# Patient Record
Sex: Male | Born: 1935 | Race: White | Hispanic: No | Marital: Married | Smoking: Former smoker
Health system: Southern US, Community
[De-identification: ages and names within clinical notes are randomized; demographics above are authoritative.]

## PROBLEM LIST (undated history)

## (undated) DIAGNOSIS — C61 Malignant neoplasm of prostate: Secondary | ICD-10-CM

## (undated) DIAGNOSIS — M1990 Unspecified osteoarthritis, unspecified site: Secondary | ICD-10-CM

## (undated) DIAGNOSIS — R0609 Other forms of dyspnea: Secondary | ICD-10-CM

## (undated) DIAGNOSIS — G250 Essential tremor: Secondary | ICD-10-CM

## (undated) DIAGNOSIS — I10 Essential (primary) hypertension: Secondary | ICD-10-CM

## (undated) DIAGNOSIS — I440 Atrioventricular block, first degree: Secondary | ICD-10-CM

## (undated) DIAGNOSIS — E785 Hyperlipidemia, unspecified: Secondary | ICD-10-CM

## (undated) DIAGNOSIS — Z950 Presence of cardiac pacemaker: Secondary | ICD-10-CM

## (undated) DIAGNOSIS — R0600 Dyspnea, unspecified: Secondary | ICD-10-CM

## (undated) DIAGNOSIS — Z720 Tobacco use: Secondary | ICD-10-CM

## (undated) DIAGNOSIS — I519 Heart disease, unspecified: Secondary | ICD-10-CM

## (undated) HISTORY — PX: ROBOT ASSISTED LAPAROSCOPIC RADICAL PROSTATECTOMY: SHX5141

## (undated) HISTORY — DX: Essential (primary) hypertension: I10

## (undated) HISTORY — DX: Dyspnea, unspecified: R06.00

## (undated) HISTORY — DX: Essential tremor: G25.0

## (undated) HISTORY — DX: Heart disease, unspecified: I51.9

## (undated) HISTORY — PX: HERNIA REPAIR: SHX51

## (undated) HISTORY — DX: Hyperlipidemia, unspecified: E78.5

## (undated) HISTORY — PX: KNEE SURGERY: SHX244

## (undated) HISTORY — DX: Unspecified osteoarthritis, unspecified site: M19.90

## (undated) HISTORY — DX: Other forms of dyspnea: R06.09

## (undated) HISTORY — PX: MENISCECTOMY: SHX123

## (undated) HISTORY — DX: Malignant neoplasm of prostate: C61

## (undated) HISTORY — DX: Atrioventricular block, first degree: I44.0

## (undated) HISTORY — PX: APPENDECTOMY: SHX54

## (undated) HISTORY — DX: Tobacco use: Z72.0

---

## 1999-09-29 ENCOUNTER — Ambulatory Visit (HOSPITAL_COMMUNITY): Admission: RE | Admit: 1999-09-29 | Discharge: 1999-09-29 | Payer: Self-pay | Admitting: Neurosurgery

## 1999-09-29 ENCOUNTER — Encounter: Payer: Self-pay | Admitting: Neurosurgery

## 2001-12-30 ENCOUNTER — Ambulatory Visit (HOSPITAL_COMMUNITY): Admission: RE | Admit: 2001-12-30 | Discharge: 2001-12-30 | Payer: Self-pay | Admitting: Pulmonary Disease

## 2002-01-13 ENCOUNTER — Ambulatory Visit (HOSPITAL_COMMUNITY): Admission: RE | Admit: 2002-01-13 | Discharge: 2002-01-13 | Payer: Self-pay | Admitting: Pulmonary Disease

## 2002-02-23 ENCOUNTER — Ambulatory Visit (HOSPITAL_COMMUNITY): Admission: RE | Admit: 2002-02-23 | Discharge: 2002-02-23 | Payer: Self-pay | Admitting: Internal Medicine

## 2002-03-03 ENCOUNTER — Encounter: Payer: Self-pay | Admitting: Unknown Physician Specialty

## 2002-03-03 ENCOUNTER — Encounter: Admission: RE | Admit: 2002-03-03 | Discharge: 2002-03-03 | Payer: Self-pay | Admitting: Unknown Physician Specialty

## 2005-06-07 ENCOUNTER — Encounter (HOSPITAL_COMMUNITY)
Admission: RE | Admit: 2005-06-07 | Discharge: 2005-07-07 | Payer: Self-pay | Admitting: Physical Medicine and Rehabilitation

## 2007-02-07 ENCOUNTER — Ambulatory Visit (HOSPITAL_COMMUNITY): Admission: RE | Admit: 2007-02-07 | Discharge: 2007-02-07 | Payer: Self-pay | Admitting: Pulmonary Disease

## 2009-04-04 ENCOUNTER — Encounter: Payer: Self-pay | Admitting: Internal Medicine

## 2009-04-18 ENCOUNTER — Ambulatory Visit: Payer: Self-pay | Admitting: Internal Medicine

## 2009-04-18 ENCOUNTER — Ambulatory Visit (HOSPITAL_COMMUNITY): Admission: RE | Admit: 2009-04-18 | Discharge: 2009-04-18 | Payer: Self-pay | Admitting: Internal Medicine

## 2009-08-20 DIAGNOSIS — I440 Atrioventricular block, first degree: Secondary | ICD-10-CM

## 2009-08-20 HISTORY — DX: Atrioventricular block, first degree: I44.0

## 2009-08-20 HISTORY — PX: COLONOSCOPY: SHX174

## 2009-12-10 ENCOUNTER — Emergency Department (HOSPITAL_COMMUNITY): Admission: EM | Admit: 2009-12-10 | Discharge: 2009-12-10 | Payer: Self-pay | Admitting: Emergency Medicine

## 2009-12-19 ENCOUNTER — Ambulatory Visit: Payer: Self-pay | Admitting: Orthopedic Surgery

## 2010-04-27 ENCOUNTER — Ambulatory Visit (HOSPITAL_COMMUNITY): Admission: RE | Admit: 2010-04-27 | Discharge: 2010-04-27 | Payer: Self-pay | Admitting: Pulmonary Disease

## 2010-05-03 ENCOUNTER — Ambulatory Visit (HOSPITAL_COMMUNITY): Admission: RE | Admit: 2010-05-03 | Discharge: 2010-05-03 | Payer: Self-pay | Admitting: Pulmonary Disease

## 2010-05-05 ENCOUNTER — Encounter (INDEPENDENT_AMBULATORY_CARE_PROVIDER_SITE_OTHER): Payer: Self-pay | Admitting: *Deleted

## 2010-05-05 ENCOUNTER — Ambulatory Visit: Payer: Self-pay | Admitting: Cardiology

## 2010-05-05 DIAGNOSIS — G250 Essential tremor: Secondary | ICD-10-CM

## 2010-05-05 DIAGNOSIS — E785 Hyperlipidemia, unspecified: Secondary | ICD-10-CM | POA: Insufficient documentation

## 2010-05-05 DIAGNOSIS — G252 Other specified forms of tremor: Secondary | ICD-10-CM

## 2010-05-05 DIAGNOSIS — F17200 Nicotine dependence, unspecified, uncomplicated: Secondary | ICD-10-CM

## 2010-05-05 DIAGNOSIS — I495 Sick sinus syndrome: Secondary | ICD-10-CM

## 2010-05-05 DIAGNOSIS — C61 Malignant neoplasm of prostate: Secondary | ICD-10-CM | POA: Insufficient documentation

## 2010-05-12 ENCOUNTER — Ambulatory Visit: Payer: Self-pay | Admitting: Cardiology

## 2010-05-17 ENCOUNTER — Encounter: Payer: Self-pay | Admitting: Cardiology

## 2010-05-25 ENCOUNTER — Encounter: Payer: Self-pay | Admitting: Cardiology

## 2010-05-25 ENCOUNTER — Ambulatory Visit (HOSPITAL_COMMUNITY): Admission: RE | Admit: 2010-05-25 | Discharge: 2010-05-25 | Payer: Self-pay | Admitting: Cardiology

## 2010-05-25 ENCOUNTER — Ambulatory Visit: Payer: Self-pay | Admitting: Cardiology

## 2010-05-30 ENCOUNTER — Ambulatory Visit: Payer: Self-pay | Admitting: Cardiology

## 2010-05-30 ENCOUNTER — Encounter (INDEPENDENT_AMBULATORY_CARE_PROVIDER_SITE_OTHER): Payer: Self-pay | Admitting: *Deleted

## 2010-06-08 ENCOUNTER — Telehealth (INDEPENDENT_AMBULATORY_CARE_PROVIDER_SITE_OTHER): Payer: Self-pay | Admitting: *Deleted

## 2010-07-04 ENCOUNTER — Ambulatory Visit (HOSPITAL_COMMUNITY): Admission: RE | Admit: 2010-07-04 | Discharge: 2010-07-04 | Payer: Self-pay | Admitting: Pulmonary Disease

## 2010-07-22 ENCOUNTER — Encounter: Payer: Self-pay | Admitting: Cardiology

## 2010-07-22 LAB — CONVERTED CEMR LAB
Calcium: 9.5 mg/dL (ref 8.4–10.5)
Cholesterol: 159 mg/dL (ref 0–200)
Glucose, Bld: 135 mg/dL — ABNORMAL HIGH (ref 70–99)
Potassium: 4.6 meq/L (ref 3.5–5.3)
Sodium: 139 meq/L (ref 135–145)
Total CHOL/HDL Ratio: 2.7
Triglycerides: 60 mg/dL (ref <150)

## 2010-09-19 NOTE — Assessment & Plan Note (Signed)
Summary: ***per Dr.Hawkins to eval for stress test/SOB/tg   Visit Type:  Initial Consult Referring Provider:  . Primary Provider:  Dr. Juanetta Gosling   History of Present Illness: Peter Lopez is seen in the office today at the kind request of Dr. Juanetta Gosling for the assessment of a progressive decline in exercise tolerance with exertional dyspnea.  This nice gentleman has enjoyed generally excellent health.  He has never previously been seen by a cardiologist nor does he have any known cardiac disorder.  He has undergone stress testing past, but not for many years.  He has noticed some decline in exercise capacity and dyspnea when walking 2 flights of stairs.  He attributes this to aging, but wonders whether he has any cardiopulmonary problems.  Recent PFTs were reportedly "good".  Current Medications (verified): 1)  Lisinopril 10 Mg Tabs (Lisinopril) .... Take 1 Tab Daily 2)  Simvastatin 40 Mg Tabs (Simvastatin) .... Take 1 Tab Daily 3)  Aspir-Low 81 Mg Tbec (Aspirin) .... Take 1 Tab Daily  Allergies (verified): No Known Drug Allergies  Past History:  Family History: Last updated: 2010-05-18 Father died at age 55 secondary to trauma Mother died at age 64 with Alzheimer's One brother died due to CHF  Social History: Last updated: 05/18/10 Married.  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  Employment-administrative work- Press photographer Tobacco-30 pack years; discontinued in 2000 Ethanol-modest 6 cups of coffee per day  Review of Systems       Requires corrective lenses for near vision; all other systems reviewed and are negative.  Vital Signs:  Patient profile:   75 year old male Weight:      173 pounds BMI:     25.64 Pulse rate:   72 / minute BP sitting:   161 / 78  (right arm)  Vitals Entered By: Dreama Saa, CNA (May 18, 2010 1:47 PM)   Impression & Recommendations:  Problem # 1:  CARDIAC CONDUCTION DISTURBANCE (ICD-426.9) Patient has profound first-degree AV block without symptoms.  I am concerned about continuing a beta blocker in the setting, but patient is concerned about exacerbation of his essential tremor.  I explained that treatment with a beta blocker will be feasible, but that a pacemaker may be necessary should symptoms of bradycardia develop.  For now, that medication will be discontinued and conduction reassessed in 1-2 weeks.  While he is off beta blocker, a stress echocardiogram will be performed to evaluate his complaints of dyspnea.  Problem # 2:  HYPERLIPIDEMIA (ICD-272.4) Recent lipid profile on therapy is being sought.  Problem # 3:  HYPERTENSION (ICD-401.1) Blood pressure control is suboptimal; however, patient avows that systolics are never this high.  I strongly suspect that he will require another blood pressure lowering agent, particularly with discontinuation of propranolol, but will allow him to get used to that fact.  He will monitor blood pressures at local pharmacies and return in one week for a blood pressure check with the  cardiology  nurses.  Problem # 4:  TOBACCO ABUSE (ICD-305.1) He claims only an occasional cigarette now and again over the past few years.  Problem # 5:  TREMOR, ESSENTIAL (ICD-333.1) His tremor is fairly pronounced and worsens  with anxiety, excitement or anger.  Beta blocker does appear to be the appropriate treatment for him.  Consulatation with a neurologist will be helpful in determining whether alternative treatments would be helpful or if even higher doses of beta blocker would result in even better symptom control.  This will be determined at a return visit after additional testing has been completed.  Other Orders: Stress Echo (Stress Echo)  Patient Instructions: 1)  Your physician recommends that you schedule a follow-up appointment in: after stress echo 2)  Your physician has recommended you make the following change in your medication:  stop propranolol 3)  Your physician has requested that you have a stress echocardiogram. For further information please visit [email or web address]  Please follow instruction sheet as given. 4)  Your physician has requested that you regularly monitor and record your blood pressure readings at home.  Please use the same machine at the same time of day to check your readings and record them to bring to your follow-up visit. 5)  You have been referred to  nurse visit in 1 week, chec bp's in drug store and record

## 2010-09-19 NOTE — Assessment & Plan Note (Signed)
Summary: 1 WK NURSE VISIT PER CHECKOUT ON 05/05/10/TG  Nurse Visit   Vital Signs:  Patient profile:   75 year old male Height:      69 inches Weight:      175 pounds BMI:     25.94 O2 Sat:      96 % on Room air Temp:     98.2 degrees F oral Pulse rate:   81 / minute BP sitting:   167 / 83  (left arm)  Vitals Entered By: Teressa Lower RN (May 12, 2010 3:49 PM)  Nutrition Counseling: Patient's BMI is greater than 25 and therefore counseled on weight management options.  O2 Flow:  Room air  Hypertension Assessment/Plan:      The patient's hypertensive risk group is category B: At least one risk factor (excluding diabetes) with no target organ damage.  Today's blood pressure is 167/83.     Visit Type:  1 week nurse visit Referring Provider:  Dr. Wende Crease Primary Provider:  Dr. Juanetta Gosling   History of Present Illness: S: ov on 05/05/10 B: stopped propranolol d/t 1st degree av block A: no cp, palp, pt has sob with exertion only, severe hand tremors;  pt takes lisinopril for Htn.  Tremors have increased, and he is unable to sign a check, eat properly, dial a telephone, drink liquids, etc.  BP dairy returned with 4 blood pressures as follows: 05/07/10 140/72 05/09/10 15078 05/10/10 136/72 05/11/10 140/72 R: I spoke with Dr. Dietrich Pates by telephone, orders given for neurology consult and pt may restart. propranolol, gave precautions for dizziness and loss of consciousness.  05/14/2010  Agree with the above.  Spiro Bing, M.D.    Hypertension History:      Positive major cardiovascular risk factors include male age 73 years old or older and family history for ischemic heart disease (males less than 10 years old).     Serial Vital Signs/Assessments:  Time      Position  BP       Pulse  Resp  Temp     By 3:49 PM             151/76   81                    Tammy Sanders RN  Comments: 3:49 PM right arm By: Teressa Lower RN    Current Medications (verified): 1)   Propranolol Hcl 40 Mg Tabs (Propranolol Hcl) 2)  Lisinopril 20 Mg Tabs (Lisinopril)  Allergies (verified): No Known Drug Allergies  Orders Added: 1)  Misc. Referral [Misc. Ref]

## 2010-09-19 NOTE — Letter (Signed)
Summary: [facility], [facility] at [employer]  [address], [place] [address]   Phone: [phone]  Fax: [phone]     May 30, 2010 MRN: [id number]   Peter Lopez [address] [place], [place]  [address]      YOUR LAB WORK IS DUE   July 31, 2010  Please go to [facility], located across the street from [facility] on the second floor.  Hours are Monday - Friday 7am until 7:30pm         Saturday 8am until 12noon    _X_  DO NOT EAT OR DRINK AFTER MIDNIGHT EVENING PRIOR TO LABWORK

## 2010-09-19 NOTE — Assessment & Plan Note (Signed)
Summary: F/U STRESS ECHO TO BE DONE 10/6/TG   Visit Type:  Follow-up Referring Provider:  Dr. Wende Crease Primary Provider:  Dr. Juanetta Gosling   History of Present Illness: [name] returns to the office for continued assessment and treatment of exertional dyspnea.  Since his last visit, he has been generally well, remaining active without any significant symptoms from day to day.  He has experienced dyspnea only with unaccustomed exercise such as running up a flight of stairs.  His stress test did show some impairment in exercise capacity.  He attributes that to an adverse reaction to doxycycline with diffuse myalgias.  Otherwise, there is no evidence for cardiac disease on his stress echocardiogram.  He remains asymptomatic with respect to conduction system disease.  He stopped propranolol for approximately one week, but developed an unacceptable increase in the magnitude of his benign essential tremor prompting him to resume that medication.  There was a modest improvement in PR interval with discontinuation of his beta blocker.  Current Medications (verified): 1)  Lisinopril-Hydrochlorothiazide 20-12.5 Mg Tabs (Lisinopril-Hydrochlorothiazide) .... Take 1 Tablet By Mouth Once A Day 2)  Simvastatin 40 Mg Tabs (Simvastatin) .... Take 1 Tab Daily 3)  Aspir-Low 81 Mg Tbec (Aspirin) .... Take 1 Tab Daily 4)  Propranolol Hcl 40 Mg Tabs (Propranolol Hcl) .... Take 1 Tab Daily  Allergies (verified): No Known Drug Allergies  Comments:  Nurse/Medical Assistant: patient stated some one told him to stop his simvastatin but don't remember who it was also started back on propranalol 40 mg daily reviewed oprevious ov and med list stated all meds were correct except not on simvastatin and started back on propranolol  Past History:  PMH, FH, and Social History reviewed and updated.  Past Surgical History: Right knee open meniscectomy Laparoscopic left knee surgery Radical  prostatectomy-robotic-assisted Appendectomy Herniorrhaphy Colonoscopy-2011  Review of Systems  The patient denies chest pain, syncope, dyspnea on exertion, peripheral edema, headaches, and abdominal pain.    Vital Signs:  Patient profile:   75 year old male Weight:      169 pounds BMI:     25.05 Pulse rate:   71 / minute BP sitting:   152 / 72  (right arm)  Vitals Entered By: Dreama Saa, CNA (May 30, 2010 11:32 AM)  Physical Exam  General:  Proportionate height and weight; well developed; no acute distress:   Neck-No JVD; no carotid bruits: Lungs-No tachypnea, no rales; no rhonchi; no wheezes: Cardiovascular-normal PMI; normal S1 and S2: Abdomen-BS normal; soft and non-tender without masses or organomegaly:  Skin-Warm, no significant lesions: Extremities-Nl distal pulses; no edema:     Impression & Recommendations:  Problem # 1:  CARDIAC CONDUCTION DISTURBANCE (ICD-426.9) The future course of his conduction system disturbance cannot be predicted.  It would be desirable to find an alternative drug for benign essential tremor.  There are a number of alternatives, but this problem would best be managed by a specialist.  I have taken the liberty of referring Peter Lopez to Dr. Sandria Manly for neurologic evaluation and treatment.  For the time being, he will continue propranolol 40 mg q.d.  Problem # 2:  HYPERTENSION (ICD-401.1) Blood pressure control has been slightly suboptimal as measured in the office as well as by the patient at local pharmacies.  Will change lisinopril to lisinopril HCT, which should provide the additional 5-10 mm decrease that he requires.  Future Orders: T-Basic Metabolic Panel [id number] [id number]) ... 07/31/2010  Other Orders: Future Orders: T-Lipid Profile ([id number]) ... 07/31/2010  Patient Instructions: 1)  Your physician recommends that you schedule a follow-up appointment in: 6 months 2)  Your physician has recommended you make the following change  in your medication: stop lisinopril and begin lisinopril/hctz 20/12.5 daily, resume simvastatin 40mg  daily, 3)  Your physician recommends that you return for lab work in:2 months Prescriptions: SIMVASTATIN 40 MG TABS (SIMVASTATIN) take 1 tab daily  #30 x 3   Entered by:   Teressa Lower RN   Authorized by:   Kathlen Brunswick, MD, [facility]   Signed by:   Teressa Lower RN on 05/30/2010   Method used:   Electronically to        [facility]* (retail)       [address]       [place], [place]  [address]       Ph: [phone]       Fax: [phone]   RxID:   [id number] LISINOPRIL-HYDROCHLOROTHIAZIDE 20-12.5 MG TABS (LISINOPRIL-HYDROCHLOROTHIAZIDE) Take 1 tablet by mouth once a day  #30 x 3   Entered by:   Teressa Lower RN   Authorized by:   Kathlen Brunswick, MD, [facility]   Signed by:   Teressa Lower RN on 05/30/2010   Method used:   Electronically to        [facility]* (retail)       [address]       [place], [place]  [address]       Ph: [phone]       Fax: [phone]   RxID:   [id number]

## 2010-09-19 NOTE — Assessment & Plan Note (Signed)
Summary: fx rt great toe bringing disc/humana/guarino/bsf   Vital Signs:  Patient profile:   75 year old male Height:      69 inches Weight:      172 pounds Pulse rate:   74 / minute Resp:     16 per minute  Vitals Entered By: Fuller Canada MD (Dec 19, 2009 9:51 AM)  Visit Type:  new patient Referring Provider:  Dr. Wende Crease Primary Provider:  Dr. Juanetta Gosling  CC:  right big toe fracture.Marland Kitchen  History of Present Illness: I saw Peter Lopez in the office today for an initial visit.  He is a 75 years old man with the complaint of:  right big toe fracture.  DOI 11/17/09 hit toe on wall after slipping.  Xrays Dr. Tenna Delaine 12/16/09 for review on disc.  Fracture shoe and buddy tape treatment from Dr. Wende Crease.  In regular shoes today.  Still soreness and swelling.     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 Cardiovascular:  Denies chest pain, palpitations, fainting, and murmurs. Respiratory:  Denies short of breath, wheezing, couch, tightness, pain on inspiration, and snoring . Gastrointestinal:  Denies heartburn, nausea, vomiting, diarrhea, constipation, and blood in your stools. Genitourinary:  Denies frequency, urgency, difficulty urinating, painful urination, flank pain, and bleeding in urine. Neurologic:  Denies numbness, tingling, unsteady gait, dizziness, tremors, and seizure. Musculoskeletal:  Denies joint pain, swelling, instability, stiffness, redness, heat, and muscle pain. Endocrine:  Denies excessive thirst, exessive urination, and heat or cold  intolerance. Psychiatric:  Denies nervousness, depression, anxiety, and hallucinations. Skin:  Denies changes in the skin, poor healing, rash, itching, and redness. HEENT:  Denies blurred or double vision, eye pain, redness, and watering. Immunology:  Denies seasonal allergies, sinus problems, and allergic to bee stings. Hemoatologic:  Denies easy bleeding and brusing.  Physical Exam  Additional Exam:  VS reviewed are stable  GENERAL: Appearance is normal   CDV: normal pulse and temperature   Skin: was normal   Neuro: sensation was normal   MSK The foot is tender and swollen but the alignment is normal.  He has normal range of motion in the metatarsal phalangeal joint.  There is no joint laxity.  Muscle tone in the foot is normal.     Impression & Recommendations:  Problem # 1:  FRACTURE, RIGHT GREAT TOE (ICD-826.0) Assessment New  x-rays show a transverse fracture which has not healed it was taken on April 26 this explains the patient's persistent pain  Orders: New Patient Level II ([address])  Patient Instructions: 1)  The fracture has not healed yet  2)  You should be ok in a few more weeks

## 2010-09-19 NOTE — Letter (Signed)
Summary: Historic Patient File  Historic Patient File   Imported By: Elvera Maria 12/30/2009 10:40:06  _____________________________________________________________________  External Attachment:    Type:   Image     Comment:   history form

## 2010-09-19 NOTE — Letter (Signed)
Summary: Stress Echocardiogram Information Sheet  [facility]  [address], [place] [address]   Phone: [phone]  Fax: [phone]      May 05, 2010 MRN: [id number] light prior to the test.   Chris Im  Doctor: Appointment Date: Appointment Time: Appointment Location: [facility]  Stress Echocardiogram Information Sheet    Instructions:   1. DO NOT  take your am medicinethe morning before the test.  2. Nothing to eat or drink before the test  3. Dress prepared to exercise.  4. DO NOT use ANY caffine or tobacco products 3 hours before appointment.  5. Report to the Short Stay Center on the1st floor.  6. Please bring all current prescription medications.  7. If you have any questions, please call [phone]

## 2010-09-19 NOTE — Progress Notes (Signed)
Summary: PT DOES NOT WANT TO SEE NEURO DOC  Phone Note Call from Patient Call back at Home Phone [phone]   Caller: PT Reason for Call: Referral Summary of Call: PT LEFT A MESSAGE 06/08/10 THAT HE HAD SEEN DR HAWKINS AND CHANGED HIS MIND ABOUT GOING TO DR LOVE.   I CALLED DR LOVE AND CANCELED THE APPOINTMENT THAT WAS MADE FOR DECEMBER/TMJ Initial call taken by: Faythe Ghee,  June 08, 2010 10:41 AM  Follow-up for Phone Call        Noted. Follow-up by: Kathlen Brunswick, MD, [facility],  June 13, 2010 7:25 PM

## 2010-10-26 DIAGNOSIS — I10 Essential (primary) hypertension: Secondary | ICD-10-CM

## 2010-11-02 LAB — BLOOD GAS, ARTERIAL
Bicarbonate: 23.5 mEq/L (ref 20.0–24.0)
O2 Content: 21 L/min
TCO2: 20.2 mmol/L (ref 0–100)
pO2, Arterial: 83.7 mmHg (ref 80.0–100.0)

## 2010-11-22 ENCOUNTER — Ambulatory Visit: Payer: Self-pay | Admitting: Cardiology

## 2010-12-12 ENCOUNTER — Encounter: Payer: Self-pay | Admitting: Cardiology

## 2010-12-12 ENCOUNTER — Ambulatory Visit (INDEPENDENT_AMBULATORY_CARE_PROVIDER_SITE_OTHER): Payer: 59 | Admitting: Cardiology

## 2010-12-12 DIAGNOSIS — I459 Conduction disorder, unspecified: Secondary | ICD-10-CM

## 2010-12-12 DIAGNOSIS — E785 Hyperlipidemia, unspecified: Secondary | ICD-10-CM

## 2010-12-12 DIAGNOSIS — I10 Essential (primary) hypertension: Secondary | ICD-10-CM

## 2010-12-12 NOTE — Progress Notes (Signed)
HPI : Mr. Vanbenschoten has done well since his last visit with good exercise capacity, no chest discomfort and no dyspnea.  He monitors blood pressure at home, measuring values of 100-140 systolic and diastolics less than 80.  He has experienced no lightheadedness and no syncope.  He was treated with a course of steroids for PMR with excellent results, but has subsequently been treated with methotrexate with some recurrence of symptoms.  Current Outpatient Prescriptions on File Prior to Visit  Medication Sig Dispense Refill  . aspirin 81 MG tablet Take 81 mg by mouth daily.        Marland Kitchen lisinopril-hydrochlorothiazide (PRINZIDE,ZESTORETIC) 20-12.5 MG per tablet Take 1 tablet by mouth daily.        . propranolol (INDERAL) 40 MG tablet Take 40 mg by mouth daily.        . simvastatin (ZOCOR) 40 MG tablet Take 40 mg by mouth daily.           No Known Allergies    Past medical history, social history, and family history reviewed and updated.  ROS:  See history of present illness.  PHYSICAL EXAM: BP 146/71  Pulse 69  Ht 5\' 9"  (1.753 m)  Wt 173 lb (78.472 kg)  BMI 25.55 kg/m2  SpO2 95%  General-Well developed; no acute distress; ruddy complexion Body habitus-proportionate weight and height Neck-No JVD; no carotid bruits Lungs-clear lung fields; resonant to percussion Cardiovascular-normal PMI; normal S1 and S2 Abdomen-normal bowel sounds; soft and non-tender without masses or organomegaly Musculoskeletal-No deformities, no cyanosis or clubbing Neurologic-Normal cranial nerves; symmetric strength and tone Skin-Warm, no significant lesions Extremities-distal pulses intact; no edema  Rhythm Strip-Normal sinus rhythm with profound first degree AV block and second degree AV block type I.  ASSESSMENT AND PLAN:

## 2010-12-12 NOTE — Patient Instructions (Signed)
Your physician recommends that you schedule a follow-up appointment in:1 YEAR Your physician has requested that you regularly monitor and record your blood pressure readings at home. Please use the same machine at the same time of day to check your readings and record them to bring to your follow-up visit.  HOME BP CUFF THAT MEASURES HEART RATE  CALL FOR LIGHT HEADED, SYNCOPE, HR <40

## 2010-12-13 ENCOUNTER — Encounter: Payer: Self-pay | Admitting: Cardiology

## 2010-12-13 NOTE — Assessment & Plan Note (Signed)
Patient remains asymptomatic with respect to his conduction system disease.  It appears likely that symptomatic bradycardia will occur at some point at which time pacemaker insertion will be necessary.

## 2010-12-13 NOTE — Assessment & Plan Note (Signed)
Blood pressure control is good; current medications are well tolerated and will be continued.

## 2010-12-13 NOTE — Assessment & Plan Note (Signed)
Recent lipid profile is good; current therapy will be continued.

## 2011-01-02 NOTE — Op Note (Signed)
NAME:  Peter Lopez, Peter Lopez                ACCOUNT NO.:  [id number]   MEDICAL RECORD NO.:  [id number]          PATIENT TYPE:  AMB   LOCATION:  DAY                           FACILITY:  [facility]   PHYSICIAN:  R. Roetta Sessions, M.D. DATE OF BIRTH:  Apr 21, 1936   DATE OF PROCEDURE:  04/18/2009  DATE OF DISCHARGE:                                PROCEDURE NOTE   COLONOSCOPY   INDICATIONS FOR PROCEDURE:  This 75 year old gentleman underwent  colonoscopy back in 2003 by Dr. Karilyn Cota.  He was found have polyps.  They  were adenomas.  He returns for surveillance at this time.  He is not  having any lower GI tract symptoms.  Interim history is significant in  that he underwent a TURP for prostate cancer 2 years ago in [facility]  and is doing very well.  He did not get any radiation therapy to the  prostate.  Colonoscopy is now being done as a surveillance maneuver.  Risks, benefits, alternatives and limitations have discussed, questions  answered.  Please see the documentation in the medical record.   PROCEDURE NOTE:  O2 saturation, blood pressure, pulse and respirations  monitored throughout the entire procedure.   CONSCIOUS SEDATION:  Versed 3 mg IV, Demerol 75 mg IV in divided doses.   INSTRUMENT:  Pentax video chip system.   FINDINGS:  Digital rectal exam revealed no abnormalities.   ENDOSCOPIC FINDINGS:  Prep was adequate.   Colon:  Colonic mucosa was surveyed from the rectosigmoid junction  through the left, transverse, right colon to the appendiceal orifice,  ileocecal valve and cecum.  These structures were well seen and  photographed for the record.  Terminal ileum was intubated to 10 cm.  From this level, the scope was slowly and cautiously withdrawn.  All  previously mentioned mucosal surfaces were again seen.  The patient had  left-sided diverticula.  However, the remainder of the colonic mucosa  otherwise appeared normal as did terminal ileum mucosa.  Scope was  pulled down to the rectum  where thorough examination of the rectal  mucosa including retroflexed view of the anal verge demonstrated no  abnormalities.  The patient tolerated the procedure well and was  reactive in endoscopy.   IMPRESSION:  1. Normal rectum.  2. Left-sided diverticula.  3. Remainder of the colonic mucosa appeared, normal terminal ileum.   RECOMMENDATIONS:  1. Diverticulosis literature provided to Mr. Beyl.  2. Recommend repeat colonoscopy in 5 years.      Peter Lopez, M.D.  Electronically Signed     RMR/MEDQ  D:  04/18/2009  T:  04/18/2009  Job:  [id number]   cc:   Ramon Dredge L. Juanetta Gosling, M.D.  Fax: [phone]

## 2011-01-05 NOTE — Procedures (Signed)
[facility]  Patient:    Peter Lopez, Peter Lopez Visit Number: [id number] MRN: [id number]          Service Type: OUT Location: RAD Attending Physician:  Fredirick Maudlin Dictated by:   Kari Baars, M.D. Proc. Date: 01/13/02 Admit Date:  12/30/2001 Discharge Date: 12/30/2001                                Stress Test  PROCEDURE:  Graded exercise stress test.  PHYSICIAN:  Dr. Kari Baars.  INDICATIONS:  Mr. Cosby is undergoing a graded exercise testing because of hypertension and a positive family history of coronary artery disease.  There are no contraindications to the graded exercise testing.  DESCRIPTION OF PROCEDURE:  Mr. Strahan exercised for five minutes on the Bruce protocol, reaching and sustaining 7.0 METS.  He stopped exercising because of fatigue.  His maximum recorded heart rate was 138, which is 88% of his age-predicted maximal heart rate.  His blood pressure response to exercise was exaggerated.  He had no electrocardiographic changes suggestive of inducible ischemia.  Other than shortness of breath, no symptoms during exercise.  IMPRESSION 1. Fair exercise tolerance. 2. Hypertensive response to exercise. 3. No evidence of inducible ischemia. 4. Shortness of breath, but no other symptoms with exercise. Dictated by:   Kari Baars, M.D. Attending Physician:  Fredirick Maudlin DD:  01/13/02 TD:  01/13/02 Job: [id number]

## 2011-07-06 ENCOUNTER — Other Ambulatory Visit: Payer: Self-pay | Admitting: Cardiology

## 2011-12-07 ENCOUNTER — Ambulatory Visit (INDEPENDENT_AMBULATORY_CARE_PROVIDER_SITE_OTHER): Payer: 59 | Admitting: Cardiology

## 2011-12-07 ENCOUNTER — Encounter: Payer: Self-pay | Admitting: Cardiology

## 2011-12-07 VITALS — BP 150/78 | HR 77 | Ht 69.0 in | Wt 172.0 lb

## 2011-12-07 DIAGNOSIS — E785 Hyperlipidemia, unspecified: Secondary | ICD-10-CM

## 2011-12-07 DIAGNOSIS — C61 Malignant neoplasm of prostate: Secondary | ICD-10-CM

## 2011-12-07 DIAGNOSIS — E782 Mixed hyperlipidemia: Secondary | ICD-10-CM

## 2011-12-07 DIAGNOSIS — I10 Essential (primary) hypertension: Secondary | ICD-10-CM

## 2011-12-07 NOTE — Progress Notes (Signed)
Patient ID: Peter Lopez, male   DOB: [birth year], 76 y.o.   MRN: [id number]  HPI: Scheduled return visit for this very nice gentleman with cardiovascular risk factors and a history of disturbed cardiac conduction, but no known vascular disease.  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  He monitors blood pressure at home with generally excellent results.  A list of 19 blood pressures measured over the past month was provided by the patient.  Occasional systolics are in the 140-150 range.  All diastolics are less than 80.  Has not been hospitalized, required urgent medical care nor developed any new medical problems.  PMR, which initially was a major problem, has been well controlled over the past year.  He claims minimal tobacco use, consuming one pack of cigarettes per week.  Prior to Admission medications   Medication Sig Start Date End Date Taking? Authorizing Provider  aspirin 81 MG tablet Take 81 mg by mouth daily.     Yes Historical Provider, MD  folic acid (FOLVITE) 400 MCG tablet Take 400 mcg by mouth 2 (two) times daily.     Yes Historical Provider, MD  lisinopril-hydrochlorothiazide (PRINZIDE,ZESTORETIC) 20-12.5 MG per tablet TAKE 1 TABLET ONCE DAILY. 07/06/11  Yes Kathlen Brunswick, MD  methotrexate 2.5 MG tablet Take by mouth once a week. 5 tabs 1 time weekly    Yes Historical Provider, MD  propranolol (INDERAL) 40 MG tablet Take 40 mg by mouth daily.     Yes Historical Provider, MD  simvastatin (ZOCOR) 40 MG tablet Take 40 mg by mouth daily.     Yes Historical Provider, MD   No Known Allergies    Past medical history, social history, and family history reviewed and updated.  ROS: Denies orthopnea, PND, chest discomfort, exertional dyspnea, palpitations, lightheadedness or syncope.  All other systems reviewed and are negative.  PHYSICAL EXAM: BP 150/78  Pulse 77  Ht 5\' 9"  (1.753 m)  Wt 78.019 kg (172 lb)  BMI  25.40 kg/m2; repeat blood pressure determination was unchanged. General-Well developed; no acute distress Body habitus-proportionate weight and height Neck-No JVD; no carotid bruits Lungs-clear lung fields; resonant to percussion Cardiovascular-normal PMI; normal S1 and S2 Abdomen-normal bowel sounds; soft and non-tender without masses or organomegaly Musculoskeletal-No deformities, no cyanosis or clubbing Neurologic-Normal cranial nerves; symmetric strength and tone Skin-Warm, bluish discoloration over the nose Extremities-distal pulses intact; no edema  ASSESSMENT AND PLAN:  [place] Bing, MD 12/07/2011 2:50 PM

## 2011-12-07 NOTE — Patient Instructions (Addendum)
Your physician recommends that you return for lab work in: Lipids today  Your physician recommends that you schedule a follow-up appointment in: 1 year

## 2011-12-07 NOTE — Progress Notes (Deleted)
Name: Peter Lopez    DOB: [birth year]  Age: 76 y.o.  MR#: [id number]       PCP:  Fredirick Maudlin, MD, MD      Insurance: @PAYORNAME @   CC:    Chief Complaint  Patient presents with  . 1 year f/u    Pt. has no complaints. Med list+    VS BP 150/78  Pulse 77  Ht 5\' 9"  (1.753 m)  Wt 172 lb (78.019 kg)  BMI 25.40 kg/m2  Weights Current Weight  12/07/11 172 lb (78.019 kg)  12/12/10 173 lb (78.472 kg)  05/30/10 169 lb (76.658 kg)    Blood Pressure  BP Readings from Last 3 Encounters:  12/07/11 150/78  12/12/10 146/71  05/30/10 152/72     Admit date:  (Not on file) Last encounter with RMR:  07/06/2011   Allergy No Known Allergies  Current Outpatient Prescriptions  Medication Sig Dispense Refill  . aspirin 81 MG tablet Take 81 mg by mouth daily.        . folic acid (FOLVITE) 400 MCG tablet Take 400 mcg by mouth 2 (two) times daily.        Marland Kitchen lisinopril-hydrochlorothiazide (PRINZIDE,ZESTORETIC) 20-12.5 MG per tablet TAKE 1 TABLET ONCE DAILY.  30 tablet  6  . methotrexate 2.5 MG tablet Take by mouth once a week. 5 tabs 1 time weekly       . propranolol (INDERAL) 40 MG tablet Take 40 mg by mouth daily.        . simvastatin (ZOCOR) 40 MG tablet Take 40 mg by mouth daily.          Discontinued Meds:   There are no discontinued medications.  Patient Active Problem List  Diagnoses  . ADENOCARCINOMA, PROSTATE  . HYPERLIPIDEMIA  . TOBACCO ABUSE  . TREMOR, ESSENTIAL  . CARDIAC CONDUCTION DISTURBANCE  . Hypertension    LABS No visits with results within 3 Month(s) from this visit. Latest known visit with results is:  [facility] Conversion Encounter on 07/22/2010  Component Date Value  . Sodium 07/22/2010 139   . Potassium 07/22/2010 4.6   . Chloride 07/22/2010 101   . CO2 07/22/2010 26   . Glucose, Bld 07/22/2010 135*  . BUN 07/22/2010 22   . Creatinine, Ser 07/22/2010 0.81   . Calcium 07/22/2010 9.5   . Cholesterol 07/22/2010 159   . Triglycerides 07/22/2010 60   .  HDL 07/22/2010 58   . Total CHOL/HDL Ratio 07/22/2010 2.7 Ratio   . VLDL 07/22/2010 12   . LDL Cholesterol 07/22/2010 89      Results for this Opt Visit:     Results for orders placed in visit on 07/22/10  CONVERTED [facility] LAB      Component Value Range   Sodium 139  135-145 (meq/L)   Potassium 4.6  3.5-5.3 (meq/L)   Chloride 101  96-112 (meq/L)   CO2 26  19-32 (meq/L)   Glucose, Bld 135 (*) 70-99 (mg/dL)   BUN 22  4-09 (mg/dL)   Creatinine, Ser 8.11  0.40-1.50 (mg/dL)   Calcium 9.5  9.1-47.8 (mg/dL)   Cholesterol 295  6-213 (mg/dL)   Triglycerides 60  <086 (mg/dL)   HDL 58  >57 (mg/dL)   Total CHOL/HDL Ratio 2.7 Ratio     VLDL 12  0-40 (mg/dL)   LDL Cholesterol 89  0-99 (mg/dL)    EKG Orders placed in visit on 05/17/10  . CONVERTED [facility] EKG     Prior Assessment and Plan  Problem List as of 12/07/2011          Cardiology Problems   HYPERLIPIDEMIA   Last Assessment & Plan Note   12/12/2010 Office Visit Signed 12/13/2010  9:52 PM by Kathlen Brunswick, MD    Recent lipid profile is good; current therapy will be continued.    CARDIAC CONDUCTION DISTURBANCE   Last Assessment & Plan Note   12/12/2010 Office Visit Signed 12/13/2010  9:52 PM by Kathlen Brunswick, MD    Patient remains asymptomatic with respect to his conduction system disease.  It appears likely that symptomatic bradycardia will occur at some point at which time pacemaker insertion will be necessary.    Hypertension   Last Assessment & Plan Note   12/12/2010 Office Visit Signed 12/13/2010  9:54 PM by Kathlen Brunswick, MD    Blood pressure control is good; current medications are well tolerated and will be continued.      Other   ADENOCARCINOMA, PROSTATE   TOBACCO ABUSE   TREMOR, ESSENTIAL       Imaging: No results found.   FRS Calculation: Score not calculated. Missing: Total Cholesterol

## 2011-12-08 ENCOUNTER — Encounter: Payer: Self-pay | Admitting: Cardiology

## 2011-12-08 LAB — LIPID PANEL
Cholesterol: 163 mg/dL (ref 0–200)
LDL Cholesterol: 86 mg/dL (ref 0–99)
Total CHOL/HDL Ratio: 2.9 Ratio
Triglycerides: 100 mg/dL (ref <150)
VLDL: 20 mg/dL (ref 0–40)

## 2011-12-08 NOTE — Assessment & Plan Note (Addendum)
No evidence for recurrent carcinoma.

## 2011-12-08 NOTE — Assessment & Plan Note (Signed)
Recent lipid profile demonstrates adequate control of hyperlipidemia with current medication, which will be continued.

## 2011-12-09 ENCOUNTER — Encounter: Payer: Self-pay | Admitting: Cardiology

## 2011-12-09 NOTE — Assessment & Plan Note (Signed)
Although blood pressure is slightly suboptimal at this visit, values at home have been excellent.  Current therapy will be continued.

## 2011-12-10 ENCOUNTER — Encounter: Payer: Self-pay | Admitting: *Deleted

## 2012-01-21 ENCOUNTER — Telehealth: Payer: Self-pay | Admitting: Cardiology

## 2012-01-21 NOTE — Telephone Encounter (Signed)
PT BP LAST NIGHT WAS 103/48 P 41. HE IS CONCERNED THAT THIS IS TO LOW. THIS AM IT IS 145/74 P 55.

## 2012-01-21 NOTE — Telephone Encounter (Signed)
State that his BP and pulse are fine today, however has had a couple of incidences of lox BP and pulse.  States that he has only been keeping a log of pressure 3 times weekly.  Advised him to keep a daily record and let us know if he has any associated symptoms.  Verbalizes understanding.

## 2012-07-02 ENCOUNTER — Other Ambulatory Visit (HOSPITAL_COMMUNITY): Payer: Self-pay | Admitting: Pulmonary Disease

## 2012-07-02 ENCOUNTER — Ambulatory Visit (HOSPITAL_COMMUNITY)
Admission: RE | Admit: 2012-07-02 | Discharge: 2012-07-02 | Disposition: A | Discharge status: Home / Self Care | Payer: Medicare PPO | Source: Ambulatory Visit | Attending: Pulmonary Disease | Admitting: Pulmonary Disease

## 2012-07-02 DIAGNOSIS — R05 Cough: Secondary | ICD-10-CM

## 2012-07-02 DIAGNOSIS — R059 Cough, unspecified: Secondary | ICD-10-CM | POA: Insufficient documentation

## 2012-09-26 ENCOUNTER — Inpatient Hospital Stay (HOSPITAL_COMMUNITY): Payer: Medicare Other

## 2012-09-26 ENCOUNTER — Inpatient Hospital Stay (HOSPITAL_COMMUNITY)
Admission: AD | Admit: 2012-09-26 | Discharge: 2012-09-30 | DRG: 536 | Disposition: A | Discharge status: Home / Self Care | Payer: Medicare Other | Source: Ambulatory Visit | Attending: Pulmonary Disease | Admitting: Pulmonary Disease

## 2012-09-26 ENCOUNTER — Encounter (HOSPITAL_COMMUNITY): Payer: Self-pay | Admitting: *Deleted

## 2012-09-26 DIAGNOSIS — G252 Other specified forms of tremor: Secondary | ICD-10-CM

## 2012-09-26 DIAGNOSIS — I10 Essential (primary) hypertension: Secondary | ICD-10-CM | POA: Diagnosis present

## 2012-09-26 DIAGNOSIS — J449 Chronic obstructive pulmonary disease, unspecified: Secondary | ICD-10-CM | POA: Diagnosis present

## 2012-09-26 DIAGNOSIS — W19XXXA Unspecified fall, initial encounter: Secondary | ICD-10-CM | POA: Diagnosis present

## 2012-09-26 DIAGNOSIS — S0003XA Contusion of scalp, initial encounter: Secondary | ICD-10-CM | POA: Diagnosis present

## 2012-09-26 DIAGNOSIS — I482 Chronic atrial fibrillation, unspecified: Secondary | ICD-10-CM | POA: Diagnosis present

## 2012-09-26 DIAGNOSIS — Z9079 Acquired absence of other genital organ(s): Secondary | ICD-10-CM

## 2012-09-26 DIAGNOSIS — J4489 Other specified chronic obstructive pulmonary disease: Secondary | ICD-10-CM | POA: Diagnosis present

## 2012-09-26 DIAGNOSIS — R55 Syncope and collapse: Secondary | ICD-10-CM

## 2012-09-26 DIAGNOSIS — Z8546 Personal history of malignant neoplasm of prostate: Secondary | ICD-10-CM

## 2012-09-26 DIAGNOSIS — L4050 Arthropathic psoriasis, unspecified: Secondary | ICD-10-CM | POA: Diagnosis present

## 2012-09-26 DIAGNOSIS — I517 Cardiomegaly: Secondary | ICD-10-CM

## 2012-09-26 DIAGNOSIS — F1010 Alcohol abuse, uncomplicated: Secondary | ICD-10-CM | POA: Diagnosis present

## 2012-09-26 DIAGNOSIS — F17200 Nicotine dependence, unspecified, uncomplicated: Secondary | ICD-10-CM | POA: Diagnosis present

## 2012-09-26 DIAGNOSIS — E785 Hyperlipidemia, unspecified: Secondary | ICD-10-CM | POA: Diagnosis present

## 2012-09-26 DIAGNOSIS — Z79899 Other long term (current) drug therapy: Secondary | ICD-10-CM

## 2012-09-26 DIAGNOSIS — S1093XA Contusion of unspecified part of neck, initial encounter: Secondary | ICD-10-CM | POA: Diagnosis present

## 2012-09-26 DIAGNOSIS — M1610 Unilateral primary osteoarthritis, unspecified hip: Secondary | ICD-10-CM

## 2012-09-26 DIAGNOSIS — S329XXA Fracture of unspecified parts of lumbosacral spine and pelvis, initial encounter for closed fracture: Secondary | ICD-10-CM

## 2012-09-26 DIAGNOSIS — E7800 Pure hypercholesterolemia, unspecified: Secondary | ICD-10-CM | POA: Diagnosis present

## 2012-09-26 DIAGNOSIS — I495 Sick sinus syndrome: Secondary | ICD-10-CM | POA: Diagnosis present

## 2012-09-26 DIAGNOSIS — S32509A Unspecified fracture of unspecified pubis, initial encounter for closed fracture: Principal | ICD-10-CM | POA: Diagnosis present

## 2012-09-26 DIAGNOSIS — I4891 Unspecified atrial fibrillation: Secondary | ICD-10-CM

## 2012-09-26 LAB — COMPREHENSIVE METABOLIC PANEL
ALT: 28 U/L (ref 0–53)
Albumin: 4 g/dL (ref 3.5–5.2)
Calcium: 9.4 mg/dL (ref 8.4–10.5)
GFR calc Af Amer: >90 mL/min (ref >90)
Glucose, Bld: 185 mg/dL — ABNORMAL HIGH (ref 70–99)
Sodium: 135 mEq/L (ref 135–145)
Total Protein: 7.3 g/dL (ref 6.0–8.3)

## 2012-09-26 LAB — CBC WITH DIFFERENTIAL/PLATELET
HCT: 47.2 % (ref 39.0–52.0)
Hemoglobin: 16.1 g/dL (ref 13.0–17.0)
Lymphocytes Relative: 17 % (ref 12–46)
Monocytes Absolute: 1.1 10*3/uL — ABNORMAL HIGH (ref 0.1–1.0)
Monocytes Relative: 11 % (ref 3–12)
Neutro Abs: 7.3 10*3/uL (ref 1.7–7.7)
WBC: 10.3 10*3/uL (ref 4.0–10.5)

## 2012-09-26 MED ORDER — SODIUM CHLORIDE 0.9 % IV SOLN: 250.0000 mL | INTRAVENOUS | Status: DC | PRN

## 2012-09-26 MED ORDER — SIMVASTATIN 20 MG PO TABS
40.0000 mg | ORAL_TABLET | Freq: Every day | ORAL | Status: DC
  Administered 2012-09-26 – 2012-09-29 (×4): 40 mg via ORAL
  Filled 2012-09-26 (×4): qty 2

## 2012-09-26 MED ORDER — ONDANSETRON HCL 4 MG PO TABS: 4.0000 mg | ORAL_TABLET | Freq: Four times a day (QID) | ORAL | Status: DC | PRN

## 2012-09-26 MED ORDER — ALUM & MAG HYDROXIDE-SIMETH 200-200-20 MG/5ML PO SUSP: 30.0000 mL | Freq: Four times a day (QID) | ORAL | Status: DC | PRN

## 2012-09-26 MED ORDER — SODIUM CHLORIDE 0.9 % IJ SOLN
3.0000 mL | Freq: Two times a day (BID) | INTRAMUSCULAR | Status: DC
  Administered 2012-09-28: 3 mL via INTRAVENOUS

## 2012-09-26 MED ORDER — FOLIC ACID 400 MCG PO TABS: 400.0000 ug | ORAL_TABLET | Freq: Two times a day (BID) | ORAL | Status: DC

## 2012-09-26 MED ORDER — LISINOPRIL-HYDROCHLOROTHIAZIDE 20-12.5 MG PO TABS: 1.0000 | ORAL_TABLET | Freq: Every day | ORAL | Status: DC

## 2012-09-26 MED ORDER — MELOXICAM 7.5 MG PO TABS
15.0000 mg | ORAL_TABLET | Freq: Every day | ORAL | Status: DC
  Administered 2012-09-27 – 2012-09-30 (×3): 15 mg via ORAL
  Filled 2012-09-26: qty 1
  Filled 2012-09-26: qty 2
  Filled 2012-09-26: qty 1
  Filled 2012-09-26 (×2): qty 2
  Filled 2012-09-26: qty 1

## 2012-09-26 MED ORDER — SODIUM CHLORIDE 0.9 % IJ SOLN
3.0000 mL | Freq: Two times a day (BID) | INTRAMUSCULAR | Status: DC
  Administered 2012-09-26 – 2012-09-29 (×5): 3 mL via INTRAVENOUS

## 2012-09-26 MED ORDER — PROPRANOLOL HCL 20 MG PO TABS
40.0000 mg | ORAL_TABLET | Freq: Every day | ORAL | Status: DC
  Administered 2012-09-28: 40 mg via ORAL
  Filled 2012-09-26 (×3): qty 2

## 2012-09-26 MED ORDER — ONDANSETRON HCL 4 MG/2ML IJ SOLN: 4.0000 mg | Freq: Four times a day (QID) | INTRAMUSCULAR | Status: DC | PRN

## 2012-09-26 MED ORDER — ACETAMINOPHEN 325 MG PO TABS
650.0000 mg | ORAL_TABLET | Freq: Four times a day (QID) | ORAL | Status: DC | PRN
  Administered 2012-09-26 – 2012-09-27 (×3): 650 mg via ORAL
  Filled 2012-09-26 (×3): qty 2

## 2012-09-26 MED ORDER — FOLIC ACID 1 MG PO TABS
0.5000 mg | ORAL_TABLET | Freq: Every day | ORAL | Status: DC
  Administered 2012-09-27 – 2012-09-30 (×4): 0.5 mg via ORAL
  Filled 2012-09-26 (×4): qty 1

## 2012-09-26 MED ORDER — SODIUM CHLORIDE 0.9 % IJ SOLN: 3.0000 mL | INTRAMUSCULAR | Status: DC | PRN

## 2012-09-26 MED ORDER — LISINOPRIL 10 MG PO TABS
20.0000 mg | ORAL_TABLET | Freq: Every day | ORAL | Status: DC
  Administered 2012-09-28 – 2012-09-30 (×3): 20 mg via ORAL
  Filled 2012-09-26 (×4): qty 2

## 2012-09-26 MED ORDER — HYDROCHLOROTHIAZIDE 12.5 MG PO CAPS
12.5000 mg | ORAL_CAPSULE | Freq: Every day | ORAL | Status: DC
  Administered 2012-09-28 – 2012-09-30 (×3): 12.5 mg via ORAL
  Filled 2012-09-26: qty 1

## 2012-09-26 MED ORDER — ENOXAPARIN SODIUM 40 MG/0.4ML ~~LOC~~ SOLN
40.0000 mg | SUBCUTANEOUS | Status: DC
  Administered 2012-09-26 – 2012-09-29 (×4): 40 mg via SUBCUTANEOUS
  Filled 2012-09-26 (×5): qty 0.4

## 2012-09-26 MED ORDER — POTASSIUM CHLORIDE CRYS ER 20 MEQ PO TBCR
20.0000 meq | EXTENDED_RELEASE_TABLET | Freq: Two times a day (BID) | ORAL | Status: DC
  Administered 2012-09-26 – 2012-09-30 (×8): 20 meq via ORAL
  Filled 2012-09-26 (×9): qty 1

## 2012-09-26 MED ORDER — HYDROCODONE-ACETAMINOPHEN 5-325 MG PO TABS
1.0000 | ORAL_TABLET | ORAL | Status: DC | PRN
  Administered 2012-09-28: 2 via ORAL
  Filled 2012-09-26: qty 2

## 2012-09-26 MED ORDER — ACETAMINOPHEN 650 MG RE SUPP: 650.0000 mg | Freq: Four times a day (QID) | RECTAL | Status: DC | PRN

## 2012-09-26 NOTE — Consult Note (Signed)
[facility] A. Gerilyn Pilgrim, MD     [email or web address]          Peter Lopez is an 77 y.o. male.   ASSESSMENT/PLAN: I believe the patient most likely had a syncopal episode due to effects of unusual amount of alcohol intake for the patient and the effects of his antihypertensive medication. I am not convinced that the MRI finding indeed represent an acute infarct. The images bright on diffusion but not dark on the ADC scan. In any situation is quite tightly and unlikely to explain his symptoms. It is a possibility that the patient may have had a unwitnessed seizure, but I think this is less likely given the lack of large cortical infarct on MRI and no previous history of seizure.  However, an EEG will be obtained. Also consider cardiac events?? Apparrenlty new afib. Given hx of HTN, probably should anticoagulate.   Pelvic fracture on the right as the likely etiology of the patient's right lower extremity pain and weakness. I agree with pain control, DVT prophylaxis and physical therapy. Orthopedic consultation I believe has been obtained.  The patient is 77 year old white male who was visiting with friends and further when he was found unresponsive. The patient admits that he was out drinking more than he usually does. He probably started drinking alcoholic beverage about 1 PM and left his friend about 5 PM. He tells me that he had at least 4 drinks during that time period. He typically does not drink alcoholic beverage at least not as much. Additionally, the patient has been started on her recent antihypertensive medication Azor about 6 weeks ago. He did have issues with his medication drop in his blood pressure too low. However, this was leveled off to the normal range in the 120s/80s before he went to [place]. The patient was found unresponsive on the floor. The policy had head injuries and they had aches and body pain. He was taken to the emergency room for head CT scan showed  nothing acute. He was going to be transferred to a normal facility but wanted to return home. Inside his seek additional medical attention due to right leg weakness and pain and does not feeling well. The patient denies any oral trauma. He does report body aches involving multiple locations including the upper or lower extremities. Again, he reports severe pain involving the right lower extremity which is associated with significant right leg weakness. The other extremities are normal. The patient does not report shortness of breath. He does have some mild diffuse chest soreness in accordance to his global body soreness. The review of systems otherwise negative other than stated in the history of present illness.  GENERAL: This is a pleasant thin man who appears to be in significant discomfort and pain.  HEENT: Supple. There is significant bruising and ecchymosis involving the right facial region.  ABDOMEN: soft  EXTREMITIES: No edema. There is markedly reduced range of motion of the right hip due to severe pain.   BACK: Normal.  SKIN: Normal by inspection.    MENTAL STATUS: Alert and oriented. Speech, language and cognition are generally intact. Judgment and insight normal.   CRANIAL NERVES: Pupils are equal, round and reactive to light and accomodation; extra ocular movements are full, there is no significant nystagmus; visual fields are full; upper and lower facial muscles are normal in strength and symmetric, there is no flattening of the nasolabial folds; tongue is midline; uvula is midline; shoulder elevation is normal.  MOTOR:  The patient has significant weakness of the right lower extremity graded as 2/5 due to marked pain. The other extremities shows normal tone, bulk and strength.  COORDINATION: Left finger to nose is normal, right finger to nose is normal, No rest tremor; no intention tremor; no postural tremor; no bradykinesia.  REFLEXES: Deep tendon reflexes are symmetrical and  normal. Babinski reflexes are extensor bilaterally.   SENSATION: Normal to light touch and temperature.   The brain MRI scan is reviewed. It is a limited scan because of claustrophobia. However, there is a tiny left frontal hyperdensity seen on diffusion imaging without a corresponding dark area seen on the ADC scan. There is a mild to moderate global atrophy. There is only minimal white matter lesions seen.    Past Medical History  Diagnosis Date  . Exertional dyspnea   . 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 Hypertension   . Hyperlipidemia   . Benign essential tremor   . Tobacco abuse     30 pack years; discontinued in 2000; currently one pack per week  . Prostate cancer     S/P Prostatectomy    Past Surgical History  Procedure Date  . Meniscectomy     Right knee open meniscectomy  . Knee surgery     Left laparoscopic knee surgery  . Robot assisted laparoscopic radical prostatectomy   . Appendectomy   . Colonoscopy 2011  . Hernia repair     Family History  Problem Relation Age of Onset  . Alzheimer's disease Mother 49  . Early death Father 7    trauma  . Heart failure Brother     Social History:  reports that he has been smoking Cigarettes.  He has been smoking about 1 pack per day. He has never used smokeless tobacco. He reports that he drinks alcohol. He reports that he does not use illicit drugs.  Allergies: No Known Allergies  Medications:  Prior to Admission medications   Medication Sig Start Date End Date Taking? Authorizing Provider  amLODipine-olmesartan (AZOR) 5-40 MG per tablet Take 1 tablet by mouth daily.   Yes Historical Provider, MD  aspirin 81 MG tablet Take 81 mg by mouth daily.     Yes Historical Provider, MD  folic acid (FOLVITE) 400 MCG tablet Take 400 mcg by mouth 2 (two) times daily.     Yes Historical Provider, MD  methotrexate (RHEUMATREX) 2.5 MG tablet Take 1 mg by mouth once a week.   Yes Historical Provider, MD  propranolol  (INDERAL) 40 MG tablet Take 40 mg by mouth daily.     Yes Historical Provider, MD  simvastatin (ZOCOR) 40 MG tablet Take 40 mg by mouth daily.     Yes Historical Provider, MD    Scheduled Meds:   . enoxaparin (LOVENOX) injection  40 mg Subcutaneous Q24H  . folic acid  0.5 mg Oral Daily  . lisinopril  20 mg Oral Daily   And  . hydrochlorothiazide  12.5 mg Oral Daily  . potassium chloride  20 mEq Oral BID  . propranolol  40 mg Oral Daily  . simvastatin  40 mg Oral q1800  . sodium chloride  3 mL Intravenous Q12H  . sodium chloride  3 mL Intravenous Q12H   Continuous Infusions:  PRN Meds:.sodium chloride, acetaminophen, acetaminophen, alum & mag hydroxide-simeth, HYDROcodone-acetaminophen, ondansetron (ZOFRAN) IV, ondansetron, sodium chloride   Blood pressure 163/80, pulse 60, temperature 98.3 F (36.8 C), temperature source Oral, resp. rate 18, height  5\' 9"  (1.753 m), weight 75.5 kg (166 lb 7.2 oz), SpO2 96.00%.   Results for orders placed during the hospital encounter of 09/26/12 (from the past 48 hour(s))  COMPREHENSIVE METABOLIC PANEL     Status: Abnormal   Collection Time   09/26/12  1:44 PM      Component Value Range Comment   Sodium 135  135 - 145 mEq/L    Potassium 3.4 (*) 3.5 - 5.1 mEq/L    Chloride 97  96 - 112 mEq/L    CO2 24  19 - 32 mEq/L    Glucose, Bld 185 (*) 70 - 99 mg/dL    BUN 16  6 - 23 mg/dL    Creatinine, Ser 1.61  0.50 - 1.35 mg/dL    Calcium 9.4  8.4 - 09.6 mg/dL    Total Protein 7.3  6.0 - 8.3 g/dL    Albumin 4.0  3.5 - 5.2 g/dL    AST 30  0 - 37 U/L    ALT 28  0 - 53 U/L    Alkaline Phosphatase 174 (*) 39 - 117 U/L    Total Bilirubin 3.4 (*) 0.3 - 1.2 mg/dL    GFR calc non Af Amer >90  >90 mL/min    GFR calc Af Amer >90  >90 mL/min   CBC WITH DIFFERENTIAL     Status: Abnormal   Collection Time   09/26/12  1:44 PM      Component Value Range Comment   WBC 10.3  4.0 - 10.5 K/uL    RBC 5.09  4.22 - 5.81 MIL/uL    Hemoglobin 16.1  13.0 - 17.0 g/dL     HCT 04.5  40.9 - 81.1 %    MCV 92.7  78.0 - 100.0 fL    MCH 31.6  26.0 - 34.0 pg    MCHC 34.1  30.0 - 36.0 g/dL    RDW 91.4  78.2 - 95.6 %    Platelets 158  150 - 400 K/uL    Neutrophils Relative 70  43 - 77 %    Neutro Abs 7.3  1.7 - 7.7 K/uL    Lymphocytes Relative 17  12 - 46 %    Lymphs Abs 1.7  0.7 - 4.0 K/uL    Monocytes Relative 11  3 - 12 %    Monocytes Absolute 1.1 (*) 0.1 - 1.0 K/uL    Eosinophils Relative 2  0 - 5 %    Eosinophils Absolute 0.2  0.0 - 0.7 K/uL    Basophils Relative 0  0 - 1 %    Basophils Absolute 0.0  0.0 - 0.1 K/uL   PROTIME-INR     Status: Abnormal   Collection Time   09/26/12  1:44 PM      Component Value Range Comment   Prothrombin Time 16.9 (*) 11.6 - 15.2 seconds    INR 1.41  0.00 - 1.49     Dg Hip Complete Right  09/26/2012  *RADIOLOGY REPORT*  Clinical Data: 77 year old male with recent fall.  Right hip and lower extremity pain.  RIGHT HIP - COMPLETE 2+ VIEW  Comparison: None.  Findings: Femoral heads are normally located.  Joint spaces are preserved.  Advanced calcified atherosclerosis continuing into the lower extremities.  Proximal right femur intact.  However, there is a minimally-displaced fracture of the right superior pubic ramus. The pelvis elsewhere appears intact.  Lower lumbar disc and endplate degeneration.  IMPRESSION: 1.  Minimally-displaced fracture right superior  pubic ramus. 2.  No other acute fracture identified about the right hip or pelvis. 3.  Advanced calcified atherosclerosis.   Original Report Authenticated By: Erskine Speed, M.D.    US Carotid Duplex Bilateral  09/26/2012  *RADIOLOGY REPORT*  Clinical Data: Stroke  BILATERAL CAROTID DUPLEX ULTRASOUND  Technique: Wallace Cullens scale imaging, color Doppler and duplex ultrasound was performed of bilateral carotid and vertebral arteries in the neck.  Comparison:  None.  Criteria:  Quantification of carotid stenosis is based on velocity parameters that correlate the residual internal carotid  diameter with NASCET-based stenosis levels, using the diameter of the distal internal carotid lumen as the denominator for stenosis measurement.  The following velocity measurements were obtained:                   PEAK SYSTOLIC/END DIASTOLIC RIGHT ICA:                        94cm/sec CCA:                        89cm/sec SYSTOLIC ICA/CCA RATIO:     1.05 DIASTOLIC ICA/CCA RATIO: ECA:                        190cm/sec  LEFT ICA:                        70cm/sec CCA:                        100cm/sec SYSTOLIC ICA/CCA RATIO:     0.81 DIASTOLIC ICA/CCA RATIO: ECA:                        280cm/sec  Findings:  RIGHT CAROTID ARTERY: There is calcified scattered plaque in the common carotid there is also scattered plaque in the bulb.  Low resistance internal carotid Doppler pattern.  RIGHT VERTEBRAL ARTERY:  Antegrade.  LEFT CAROTID ARTERY: There is significant calcified plaque in the left bulb with scattered mild calcified plaque in the common carotid.  Low resistance internal carotid Doppler pattern.  LEFT VERTEBRAL ARTERY:  Antegrade.  IMPRESSION: Less than 50% stenosis in the right and left internal carotid arteries.  There is calcified plaque in the bilateral common and internal carotid arteries.  Significant narrowing at the origin of the external carotid arteries is suspected.   Original Report Authenticated By: Jolaine Click, M.D.    Mr Brain Ltd W/o Cm  09/26/2012  *RADIOLOGY REPORT*  Clinical Data: Syncope  MRI HEAD WITHOUT CONTRAST  Technique:  Multiplanar, multiecho pulse sequences of the brain and surrounding structures were obtained according to standard protocol without intravenous contrast.  Comparison: None.  Findings: Incomplete study.  The patient was not able to complete the study.  Sagittal T1 and axial diffusion and T2 imaging was performed.  Small hyperintensity left frontal cortex on diffusion weighted imaging is most likely acute infarct.  No other areas of acute infarct.  Age appropriate atrophy.  No  hydrocephalus.  Brainstem and cerebellum are normal.  IMPRESSION: Incomplete study.  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   Original Report Authenticated By: [name], M.D.         Maximillian Habibi A. Gerilyn Pilgrim, M.D.  Diplomate, Biomedical engineer of Psychiatry and Neurology ( Neurology). 09/26/2012, 7:01 PM

## 2012-09-26 NOTE — Progress Notes (Signed)
*  PRELIMINARY RESULTS* Echocardiogram 2D Echocardiogram has been performed.  Peter Lopez 09/26/2012, 4:56 PM

## 2012-09-26 NOTE — H&P (Signed)
NAMERAND, ETCHISON NO.:  [id number]  MEDICAL RECORD NO.:  [id number]  LOCATION:  A315                          FACILITY:  [facility]  PHYSICIAN:  Seraphim Affinito L. Juanetta Gosling, M.D.DATE OF BIRTH:  May 23, 1936  DATE OF ADMISSION:  09/26/2012 DATE OF DISCHARGE:  LH                             HISTORY & PHYSICAL   CHIEF COMPLAINT:  Syncope.  HISTORY:  Mr. Serio is a 77 year old who was in his usual state of fair health, and was visiting [place], [place].  He was there with friends and apparently he had gone outside and had an unobserved syncopal event.  He was taken to the emergency room with no memory of the event and was found to have facial contusion and had a CT that showed what appeared to be a stroke.  He was also in atrial fibrillation which is new.  He was brought here, came to my office this morning.  He was having significant difficulty ambulating, said that he could not lift his right leg and also had difficulty even standing with the use of a walker.  He was still in atrial fibrillation with a fairly slow ventricular response.  He is admitted because of what appears to be a acute/subacute stroke, inability to use his leg, incomplete evaluation, atrial fibrillation, and is not doing well after release from the emergency room in [place].  PAST MEDICAL HISTORY:  Positive for prostate cancer, hyperlipidemia, essential tremor, hypertension.  SOCIAL HISTORY:  He smokes about a packet of cigarettes daily and has for sometime.  He lives at home.  FAMILY HISTORY:  His family history is not known to be positive for stroke.  REVIEW OF SYSTEMS:  Except as mentioned is negative.  He complains of some pain in his right hip.  He has not had trouble swallowing.  PHYSICAL EXAMINATION:  GENERAL:  A well-developed, well-nourished male who appears to be in some acute distress.  He has contusions on the right side of his face. NECK:  Supple. CHEST:  Relatively clear. HEART:   His heart rate is relatively slow in the 40s, but he seems to be somewhat irregular. ABDOMEN:  Soft without masses. EXTREMITIES:  No edema.  He is unable to lift his right leg.  He has mildly diminished grip strength in his right hand.  He has a tremor which is a chronic finding.  His tongue protrudes in midline.  He is using a walker but still having significant difficulty standing and ambulating.  ASSESSMENT:  I think he has had a stroke.  He had a CT and it was somewhat equivocal, so I think he needs to have an MRI to further evaluate this.  He has atrial fibrillation which is a new problem.  He has probably some element of COPD.  He has hypertension.  PLAN:  My plan then is going to be to get the MRI, get echocardiogram, carotid study, Neurology consultation, and he probably needs to be anticoagulated but I want to be sure that he does not have a significant hemorrhage.  He has psoriasis and psoriatic arthritis.     Xyla Leisner L. Juanetta Gosling, M.D.     ELH/MEDQ  D:  09/26/2012  T:  09/26/2012  Job:  [id number]

## 2012-09-26 NOTE — Plan of Care (Signed)
Problem: Phase I Progression Outcomes Goal: Strict NPO til swallow screen done Outcome: Completed/Met Date Met:  09/26/12 Pt passed swallow Goal: Initial discharge plan identified Outcome: Progressing Plan to d/c home with family and possible home health if needed

## 2012-09-27 MED ORDER — VITAMIN B-1 100 MG PO TABS
100.0000 mg | ORAL_TABLET | Freq: Every day | ORAL | Status: DC
  Administered 2012-09-27 – 2012-09-30 (×4): 100 mg via ORAL
  Filled 2012-09-27 (×4): qty 1

## 2012-09-27 MED ORDER — LORAZEPAM 1 MG PO TABS
1.0000 mg | ORAL_TABLET | Freq: Four times a day (QID) | ORAL | Status: AC | PRN
  Administered 2012-09-27 – 2012-09-29 (×2): 1 mg via ORAL
  Filled 2012-09-27 (×2): qty 1

## 2012-09-27 MED ORDER — NICOTINE 21 MG/24HR TD PT24
21.0000 mg | MEDICATED_PATCH | Freq: Every day | TRANSDERMAL | Status: DC
  Filled 2012-09-27 (×2): qty 1

## 2012-09-27 MED ORDER — TRAMADOL HCL 50 MG PO TABS
50.0000 mg | ORAL_TABLET | Freq: Four times a day (QID) | ORAL | Status: DC | PRN
  Administered 2012-09-27 – 2012-09-30 (×3): 50 mg via ORAL
  Filled 2012-09-27 (×3): qty 1

## 2012-09-27 MED ORDER — ADULT MULTIVITAMIN W/MINERALS CH
1.0000 | ORAL_TABLET | Freq: Every day | ORAL | Status: DC
  Administered 2012-09-27 – 2012-09-30 (×4): 1 via ORAL
  Filled 2012-09-27 (×4): qty 1

## 2012-09-27 MED ORDER — THIAMINE HCL 100 MG/ML IJ SOLN: 100.0000 mg | Freq: Every day | INTRAMUSCULAR | Status: DC

## 2012-09-27 MED ORDER — LORAZEPAM 2 MG/ML IJ SOLN: 1.0000 mg | Freq: Four times a day (QID) | INTRAMUSCULAR | Status: AC | PRN

## 2012-09-27 NOTE — Progress Notes (Signed)
Subjective: He says he feels better but has continued pain in his hip. His son and son-in-law has given me more information. He apparently had been drinking heavily prior to his episode of falling. They think he had at least 8-10 alcoholic beverages in the period of about 6 hours. He has had previous episodes similar to this. It is not clear them that what he had was a stroke and his atrial fibrillation may have been related to acute alcohol intake. Dr. Gerilyn Pilgrim has seen him and told the family that he does not think Peter Lopez had a definite stroke but I do not have Dr. Ronal Fear note yet. He may need anticoagulations because of atrial fibrillation but I'm going to ask for cardiology input as far as that is concerned. His family does not feel like he is willing to make a lifestyle change at this point but I will talk to Peter Lopez at some point when he does not have multiple family members in the room  Objective: Vital signs in last 24 hours: Temp:  [97.6 F (36.4 C)-98.7 F (37.1 C)] 97.6 F (36.4 C) (02/08 0542) Pulse Rate:  [60-64] 64 (02/08 0542) Resp:  [18-24] 24 (02/08 0542) BP: (120-163)/(54-80) 132/64 mmHg (02/08 1032) SpO2:  [95 %-96 %] 96 % (02/08 0542) Weight:  [75.5 kg (166 lb 7.2 oz)] 75.5 kg (166 lb 7.2 oz) (02/07 1210) Weight change:  Last BM Date: 09/26/12  Intake/Output from previous day: 02/07 0701 - 02/08 0700 In: 240 [P.O.:240] Out: 1350 [Urine:1350]  PHYSICAL EXAM General appearance: alert, cooperative and mild distress Resp: rhonchi bilaterally Cardio: irregularly irregular rhythm GI: soft, non-tender; bowel sounds normal; no masses,  no organomegaly Extremities: Some bruising of the right hip  Lab Results:    Basic Metabolic Panel:  Recent Labs  40/98/11 1344  NA 135  K 3.4*  CL 97  CO2 24  GLUCOSE 185*  BUN 16  CREATININE 0.57  CALCIUM 9.4   Liver Function Tests:  Recent Labs  09/26/12 1344  AST 30  ALT 28  ALKPHOS 174*  BILITOT 3.4*   PROT 7.3  ALBUMIN 4.0   No results found for this basename: LIPASE, AMYLASE,  in the last 72 hours No results found for this basename: AMMONIA,  in the last 72 hours CBC:  Recent Labs  09/26/12 1344  WBC 10.3  NEUTROABS 7.3  HGB 16.1  HCT 47.2  MCV 92.7  PLT 158   Cardiac Enzymes: No results found for this basename: CKTOTAL, CKMB, CKMBINDEX, TROPONINI,  in the last 72 hours BNP: No results found for this basename: PROBNP,  in the last 72 hours D-Dimer: No results found for this basename: DDIMER,  in the last 72 hours CBG: No results found for this basename: GLUCAP,  in the last 72 hours Hemoglobin A1C: No results found for this basename: HGBA1C,  in the last 72 hours Fasting Lipid Panel: No results found for this basename: CHOL, HDL, LDLCALC, TRIG, CHOLHDL, LDLDIRECT,  in the last 72 hours Thyroid Function Tests: No results found for this basename: TSH, T4TOTAL, FREET4, T3FREE, THYROIDAB,  in the last 72 hours Anemia Panel: No results found for this basename: VITAMINB12, FOLATE, FERRITIN, TIBC, IRON, RETICCTPCT,  in the last 72 hours Coagulation:  Recent Labs  09/26/12 1344  LABPROT 16.9*  INR 1.41   Urine Drug Screen: Drugs of Abuse  No results found for this basename: labopia, cocainscrnur, labbenz, amphetmu, thcu, labbarb    Alcohol Level: No results found for  this basename: ETH,  in the last 72 hours Urinalysis: No results found for this basename: COLORURINE, APPERANCEUR, LABSPEC, PHURINE, GLUCOSEU, HGBUR, BILIRUBINUR, KETONESUR, PROTEINUR, UROBILINOGEN, NITRITE, LEUKOCYTESUR,  in the last 72 hours Misc. Labs:  ABGS No results found for this basename: PHART, PCO2, PO2ART, TCO2, HCO3,  in the last 72 hours CULTURES No results found for this or any previous visit (from the past 240 hour(s)). Studies/Results: Dg Hip Complete Right  09/26/2012  *RADIOLOGY REPORT*  Clinical Data: 77 year old male with recent fall.  Right hip and lower extremity pain.  RIGHT  HIP - COMPLETE 2+ VIEW  Comparison: None.  Findings: Femoral heads are normally located.  Joint spaces are preserved.  Advanced calcified atherosclerosis continuing into the lower extremities.  Proximal right femur intact.  However, there is a minimally-displaced fracture of the right superior pubic ramus. The pelvis elsewhere appears intact.  Lower lumbar disc and endplate degeneration.  IMPRESSION: 1.  Minimally-displaced fracture right superior pubic ramus. 2.  No other acute fracture identified about the right hip or pelvis. 3.  Advanced calcified atherosclerosis.   Original Report Authenticated By: Erskine Speed, M.D.    US Carotid Duplex Bilateral  09/26/2012  *RADIOLOGY REPORT*  Clinical Data: Stroke  BILATERAL CAROTID DUPLEX ULTRASOUND  Technique: Wallace Cullens scale imaging, color Doppler and duplex ultrasound was performed of bilateral carotid and vertebral arteries in the neck.  Comparison:  None.  Criteria:  Quantification of carotid stenosis is based on velocity parameters that correlate the residual internal carotid diameter with NASCET-based stenosis levels, using the diameter of the distal internal carotid lumen as the denominator for stenosis measurement.  The following velocity measurements were obtained:                   PEAK SYSTOLIC/END DIASTOLIC RIGHT ICA:                        94cm/sec CCA:                        89cm/sec SYSTOLIC ICA/CCA RATIO:     1.05 DIASTOLIC ICA/CCA RATIO: ECA:                        190cm/sec  LEFT ICA:                        70cm/sec CCA:                        100cm/sec SYSTOLIC ICA/CCA RATIO:     0.81 DIASTOLIC ICA/CCA RATIO: ECA:                        280cm/sec  Findings:  RIGHT CAROTID ARTERY: There is calcified scattered plaque in the common carotid there is also scattered plaque in the bulb.  Low resistance internal carotid Doppler pattern.  RIGHT VERTEBRAL ARTERY:  Antegrade.  LEFT CAROTID ARTERY: There is significant calcified plaque in the left bulb with scattered  mild calcified plaque in the common carotid.  Low resistance internal carotid Doppler pattern.  LEFT VERTEBRAL ARTERY:  Antegrade.  IMPRESSION: Less than 50% stenosis in the right and left internal carotid arteries.  There is calcified plaque in the bilateral common and internal carotid arteries.  Significant narrowing at the origin of the external carotid arteries is suspected.   Original Report Authenticated By:  Jolaine Click, M.D.    Mr Brain Ltd W/o Cm  09/26/2012  *RADIOLOGY REPORT*  Clinical Data: Syncope  MRI HEAD WITHOUT CONTRAST  Technique:  Multiplanar, multiecho pulse sequences of the brain and surrounding structures were obtained according to standard protocol without intravenous contrast.  Comparison: None.  Findings: Incomplete study.  The patient was not able to complete the study.  Sagittal T1 and axial diffusion and T2 imaging was performed.  Small hyperintensity left frontal cortex on diffusion weighted imaging is most likely acute infarct.  No other areas of acute infarct.  Age appropriate atrophy.  No hydrocephalus.  Brainstem and cerebellum are normal.  IMPRESSION: Incomplete study.  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   Original Report Authenticated By: [name], M.D.     Medications:  Prior to Admission:  Prescriptions prior to admission  Medication Sig Dispense Refill  . amLODipine-olmesartan (AZOR) 5-40 MG per tablet Take 1 tablet by mouth daily.      Marland Kitchen aspirin 81 MG tablet Take 81 mg by mouth daily.        . folic acid (FOLVITE) 400 MCG tablet Take 400 mcg by mouth 2 (two) times daily.        . methotrexate (RHEUMATREX) 2.5 MG tablet Take 1 mg by mouth once a week.      . propranolol (INDERAL) 40 MG tablet Take 40 mg by mouth daily.        . simvastatin (ZOCOR) 40 MG tablet Take 40 mg by mouth daily.         Scheduled: . enoxaparin (LOVENOX) injection  40 mg Subcutaneous Q24H  . folic acid  0.5 mg Oral Daily  . lisinopril  20 mg Oral Daily    And  . hydrochlorothiazide  12.5 mg Oral Daily  . meloxicam  15 mg Oral Daily  . multivitamin with minerals  1 tablet Oral Daily  . nicotine  21 mg Transdermal Daily  . potassium chloride  20 mEq Oral BID  . propranolol  40 mg Oral Daily  . simvastatin  40 mg Oral q1800  . sodium chloride  3 mL Intravenous Q12H  . sodium chloride  3 mL Intravenous Q12H  . thiamine  100 mg Oral Daily   Or  . thiamine  100 mg Intravenous Daily   Continuous:  OZH:YQMVHQ chloride, acetaminophen, acetaminophen, alum & mag hydroxide-simeth, HYDROcodone-acetaminophen, LORazepam, LORazepam, ondansetron (ZOFRAN) IV, ondansetron, sodium chloride, traMADol  Assesment: He has a pelvic fracture. He had a syncopal episode and there was concern that he had a stroke. With the further history we have available this may actually have been an alcoholic blackout and fall. He has hypertension which is fairly well controlled. He has atrial fibrillation which is a new finding. Active Problems:   * No active hospital problems. *    Plan: I am going to start him on alcohol withdrawal protocol with this new information and also going to have him use a nicotine patch. He is going to try to get up and go to the bathroom with assistance and using his walker. I will last for orthopedic consultation regarding his pelvic fracture but I don't think there's anything that can be done. I will asked for cardiology consultation regarding the atrial fibrillation and see if they feel that he is a candidate for anti-coagulation but that may be problematic because he has had several falls.    LOS: 1 day   Alcie Runions L 09/27/2012, 10:34 AM

## 2012-09-27 NOTE — Evaluation (Signed)
Physical Therapy Evaluation Patient Details Name: Peter Lopez MRN: [id number] DOB: [birth year] Today's Date: 09/27/2012 Time: 2130-8657 PT Time Calculation (min): 32 min  PT Assessment / Plan / Recommendation Clinical Impression  Pt is a 77 year old male admitted to [facility] secondary to a fall at home with possible stroke and findings of minimally displaced R pubic fracture.  At this time he is still awaiting consult from orthopedic surgeon.  He is referred to PT for general mobility.  At this time he requires overall min A-mod A for mobility and is limited mostly by pain to groin region and has decreased activitiy tolerance. Recommend OP PT and educated to continue with LE exercises in bed that he was doing at home before the fall to improve LE strength.     PT Assessment  Patient needs continued PT services    Follow Up Recommendations  Outpatient PT    Does the patient have the potential to tolerate intense rehabilitation      Barriers to Discharge        Equipment Recommendations  Rolling walker with 5" wheels    Recommendations for Other Services OT consult   Frequency 7X/week    Precautions / Restrictions     Pertinent Vitals/Pain 8/10 - made nursing aware.       Mobility  Bed Mobility Bed Mobility: Supine to Sit;Sitting - Scoot to Edge of Bed Supine to Sit: 3: Mod assist Sitting - Scoot to Edge of Bed: 5: Supervision Transfers Transfers: Sit to Stand;Stand to Sit Sit to Stand: 4: Min guard;With upper extremity assist;From bed Stand to Sit: 4: Min assist;With armrests;To chair/3-in-1 Details for Transfer Assistance: cueing for handplacement and direction to chair Ambulation/Gait Ambulation/Gait Assistance: 4: Min guard Ambulation Distance (Feet): 100 Feet Assistive device: Rollator Ambulation/Gait Assistance Details: cueing for posture.  Using a rollator given to him from a friend, it is too short.  Attempted to adjust, however it was at its max height Gait  Pattern: Decreased trunk rotation;Antalgic;Lateral hip instability;Trunk flexed General Gait Details: trunk flexed due to improper height of rollator.  Pt wanted to use rollator secondary to comofort level.  Stairs: No    Exercises General Exercises - Lower Extremity Long Arc Quad: AROM;Both;10 reps Toe Raises: AROM;Both;5 reps Heel Raises: AROM;Both;5 reps   PT Diagnosis: Difficulty walking;Generalized weakness;Acute pain  PT Problem List: Decreased activity tolerance;Decreased mobility;Decreased strength;Pain PT Treatment Interventions: DME instruction;Gait training;Therapeutic activities;Therapeutic exercise;Balance training   PT Goals Acute Rehab PT Goals PT Goal Formulation: With patient Time For Goal Achievement: 10/04/12 Pt will go Supine/Side to Sit: with min assist;with rail;with HOB not 0 degrees (comment degree) PT Goal: Supine/Side to Sit - Progress: Goal set today Pt will go Sit to Stand: with upper extremity assist;with modified independence PT Goal: Sit to Stand - Progress: Goal set today Pt will go Stand to Sit: with upper extremity assist;with modified independence PT Goal: Stand to Sit - Progress: Goal set today Pt will Ambulate: >150 feet;with modified independence;with least restrictive assistive device Pt will Go Up / Down Stairs: 1-2 stairs;with supervision;with rail(s) PT Goal: Up/Down Stairs - Progress: Goal set today Pt will Perform Home Exercise Program: Independently PT Goal: Perform Home Exercise Program - Progress: Goal set today  Visit Information  Last PT Received On: 09/27/12    Subjective Data  Subjective: Pt reports that his hip is hurting a lot when moving.  Has a hard time bending his R leg.  Patient Stated Goal: plans to go home.  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, nature) Communication Communication: No difficulties Dominant Hand: Right    Cognition  Cognition Overall Cognitive Status: Appears within functional limits for tasks assessed/performed Arousal/Alertness: Awake/alert Orientation Level: Appears intact for tasks assessed Behavior During Session: [facility] for tasks performed    Extremity/Trunk Assessment Right Lower Extremity Assessment RLE ROM/Strength/Tone: Deficits;Due to pain Left Lower Extremity Assessment LLE ROM/Strength/Tone: WFL for tasks assessed   Balance Balance Balance Assessed: Yes Static Sitting Balance Static Sitting - Balance Support: No upper extremity supported;Feet supported Static Sitting - Level of Assistance: 7: Independent  End of Session PT - End of Session Equipment Utilized During Treatment: Gait belt Patient left: in chair;with call bell/phone within reach;with family/visitor present Nurse Communication: Mobility status;Patient requests pain meds   Lashaye Fisk, PT 09/27/2012, 2:36 PM

## 2012-09-27 NOTE — Progress Notes (Signed)
Dr. Juanetta Gosling notified of patients HR trend for the day mid 40's to low 70's. MD acknowleged. No new orders at this time. Will continue to monitor pts HR, pain, and CIWA scale.

## 2012-09-28 NOTE — Progress Notes (Addendum)
Physical Therapy Treatment Patient Details Name: Peter Lopez MRN: [id number] DOB: 06/22/1936 Today's Date: 09/28/2012 Time: 0850-0910 PT Time Calculation (min): 20 min  PT Assessment / Plan / Recommendation Comments on Treatment Session  Pt appears to have improved since last tx. Pt able to ambulate increased distance with decreased pain. Reviewed bed exercises and pt is independent with them. RLE requires some manual assistance from patient. Pt is aware of the importance of exercise and is eager to participate in therapy.                       Plan  (Continue per PT POC.)    Precautions / Restrictions Restrictions Weight Bearing Restrictions: No    Mobility  Transfers Transfers: Sit to Stand;Stand to Sit Sit to Stand: 6: Modified independent (Device/Increase time);With upper extremity assist Stand to Sit: 6: Modified independent (Device/Increase time);With upper extremity assist Ambulation/Gait Ambulation/Gait Assistance: 5: Supervision Ambulation Distance (Feet): 300 Feet Assistive device: Rollator Ambulation/Gait Assistance Details: Pt is very aware that he needs tostand with correct posture. Gait Pattern: Decreased stride length    Exercises Ankle pumps , quad sets, heel slides and SLR were reviewed and pt is independent with these. He completes these exercises throughout the day on his own.   Visit Information  Last PT Received On: 09/28/12    Subjective Data  Subjective: Pt states that he is feeling much better today.         End of Session PT - End of Session Equipment Utilized During Treatment: Gait belt Activity Tolerance: Patient tolerated treatment well Patient left: in bed;with call bell/phone within reach  Seth Bake, PTA 09/28/2012, 9:20 AM

## 2012-09-28 NOTE — Progress Notes (Signed)
Subjective: He was admitted with syncope and has a pelvic fracture. He has new diagnosis of atrial fibrillation with well-controlled ventricular response. According to his family he has had several falls so I'm not sure about fully anticoagulating him. He apparently has a history of significant alcohol abuse but when I discussed that with him today he down played that.  Objective: Vital signs in last 24 hours: Temp:  [97.2 F (36.2 C)-97.8 F (36.6 C)] 97.2 F (36.2 C) (02/09 0520) Pulse Rate:  [44-58] 58 (02/09 0520) Resp:  [19-20] 20 (02/09 0520) BP: (95-140)/(55-65) 140/65 mmHg (02/09 0520) SpO2:  [93 %-97 %] 97 % (02/09 0520) Weight:  [75.5 kg (166 lb 7.2 oz)] 75.5 kg (166 lb 7.2 oz) (02/09 0520) Weight change: 0 kg (0 lb) Last BM Date: 09/26/12  Intake/Output from previous day: 02/08 0701 - 02/09 0700 In: 480 [P.O.:480] Out: -   PHYSICAL EXAM General appearance: alert, cooperative and no distress Resp: clear to auscultation bilaterally Cardio: irregularly irregular rhythm GI: soft, non-tender; bowel sounds normal; no masses,  no organomegaly Extremities: He has better range of motion of his right leg  Lab Results:    Basic Metabolic Panel:  Recent Labs  16/10/96 1344  NA 135  K 3.4*  CL 97  CO2 24  GLUCOSE 185*  BUN 16  CREATININE 0.57  CALCIUM 9.4   Liver Function Tests:  Recent Labs  09/26/12 1344  AST 30  ALT 28  ALKPHOS 174*  BILITOT 3.4*  PROT 7.3  ALBUMIN 4.0   No results found for this basename: LIPASE, AMYLASE,  in the last 72 hours No results found for this basename: AMMONIA,  in the last 72 hours CBC:  Recent Labs  09/26/12 1344  WBC 10.3  NEUTROABS 7.3  HGB 16.1  HCT 47.2  MCV 92.7  PLT 158   Cardiac Enzymes: No results found for this basename: CKTOTAL, CKMB, CKMBINDEX, TROPONINI,  in the last 72 hours BNP: No results found for this basename: PROBNP,  in the last 72 hours D-Dimer: No results found for this basename: DDIMER,   in the last 72 hours CBG: No results found for this basename: GLUCAP,  in the last 72 hours Hemoglobin A1C: No results found for this basename: HGBA1C,  in the last 72 hours Fasting Lipid Panel: No results found for this basename: CHOL, HDL, LDLCALC, TRIG, CHOLHDL, LDLDIRECT,  in the last 72 hours Thyroid Function Tests: No results found for this basename: TSH, T4TOTAL, FREET4, T3FREE, THYROIDAB,  in the last 72 hours Anemia Panel: No results found for this basename: VITAMINB12, FOLATE, FERRITIN, TIBC, IRON, RETICCTPCT,  in the last 72 hours Coagulation:  Recent Labs  09/26/12 1344  LABPROT 16.9*  INR 1.41   Urine Drug Screen: Drugs of Abuse  No results found for this basename: labopia, cocainscrnur, labbenz, amphetmu, thcu, labbarb    Alcohol Level: No results found for this basename: ETH,  in the last 72 hours Urinalysis: No results found for this basename: COLORURINE, APPERANCEUR, LABSPEC, PHURINE, GLUCOSEU, HGBUR, BILIRUBINUR, KETONESUR, PROTEINUR, UROBILINOGEN, NITRITE, LEUKOCYTESUR,  in the last 72 hours Misc. Labs:  ABGS No results found for this basename: PHART, PCO2, PO2ART, TCO2, HCO3,  in the last 72 hours CULTURES No results found for this or any previous visit (from the past 240 hour(s)). Studies/Results: Dg Hip Complete Right  09/26/2012  *RADIOLOGY REPORT*  Clinical Data: 77 year old male with recent fall.  Right hip and lower extremity pain.  RIGHT HIP - COMPLETE 2+ VIEW  Comparison: None.  Findings: Femoral heads are normally located.  Joint spaces are preserved.  Advanced calcified atherosclerosis continuing into the lower extremities.  Proximal right femur intact.  However, there is a minimally-displaced fracture of the right superior pubic ramus. The pelvis elsewhere appears intact.  Lower lumbar disc and endplate degeneration.  IMPRESSION: 1.  Minimally-displaced fracture right superior pubic ramus. 2.  No other acute fracture identified about the right hip or  pelvis. 3.  Advanced calcified atherosclerosis.   Original Report Authenticated By: Erskine Speed, M.D.    US Carotid Duplex Bilateral  09/26/2012  *RADIOLOGY REPORT*  Clinical Data: Stroke  BILATERAL CAROTID DUPLEX ULTRASOUND  Technique: Wallace Cullens scale imaging, color Doppler and duplex ultrasound was performed of bilateral carotid and vertebral arteries in the neck.  Comparison:  None.  Criteria:  Quantification of carotid stenosis is based on velocity parameters that correlate the residual internal carotid diameter with NASCET-based stenosis levels, using the diameter of the distal internal carotid lumen as the denominator for stenosis measurement.  The following velocity measurements were obtained:                   PEAK SYSTOLIC/END DIASTOLIC RIGHT ICA:                        94cm/sec CCA:                        89cm/sec SYSTOLIC ICA/CCA RATIO:     1.05 DIASTOLIC ICA/CCA RATIO: ECA:                        190cm/sec  LEFT ICA:                        70cm/sec CCA:                        100cm/sec SYSTOLIC ICA/CCA RATIO:     0.81 DIASTOLIC ICA/CCA RATIO: ECA:                        280cm/sec  Findings:  RIGHT CAROTID ARTERY: There is calcified scattered plaque in the common carotid there is also scattered plaque in the bulb.  Low resistance internal carotid Doppler pattern.  RIGHT VERTEBRAL ARTERY:  Antegrade.  LEFT CAROTID ARTERY: There is significant calcified plaque in the left bulb with scattered mild calcified plaque in the common carotid.  Low resistance internal carotid Doppler pattern.  LEFT VERTEBRAL ARTERY:  Antegrade.  IMPRESSION: Less than 50% stenosis in the right and left internal carotid arteries.  There is calcified plaque in the bilateral common and internal carotid arteries.  Significant narrowing at the origin of the external carotid arteries is suspected.   Original Report Authenticated By: Jolaine Click, M.D.    Mr Brain Ltd W/o Cm  09/26/2012  *RADIOLOGY REPORT*  Clinical Data: Syncope  MRI HEAD  WITHOUT CONTRAST  Technique:  Multiplanar, multiecho pulse sequences of the brain and surrounding structures were obtained according to standard protocol without intravenous contrast.  Comparison: None.  Findings: Incomplete study.  The patient was not able to complete the study.  Sagittal T1 and axial diffusion and T2 imaging was performed.  Small hyperintensity left frontal cortex on diffusion weighted imaging is most likely acute infarct.  No other areas of acute infarct.  Age appropriate atrophy.  No  hydrocephalus.  Brainstem and cerebellum are normal.  IMPRESSION: Incomplete study.  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   Original Report Authenticated By: [name], M.D.     Medications:  Prior to Admission:  Prescriptions prior to admission  Medication Sig Dispense Refill  . amLODipine-olmesartan (AZOR) 5-40 MG per tablet Take 1 tablet by mouth daily.      Marland Kitchen aspirin 81 MG tablet Take 81 mg by mouth daily.        . folic acid (FOLVITE) 400 MCG tablet Take 400 mcg by mouth 2 (two) times daily.        . methotrexate (RHEUMATREX) 2.5 MG tablet Take 1 mg by mouth once a week.      . propranolol (INDERAL) 40 MG tablet Take 40 mg by mouth daily.        . simvastatin (ZOCOR) 40 MG tablet Take 40 mg by mouth daily.         Scheduled: . enoxaparin (LOVENOX) injection  40 mg Subcutaneous Q24H  . folic acid  0.5 mg Oral Daily  . lisinopril  20 mg Oral Daily   And  . hydrochlorothiazide  12.5 mg Oral Daily  . meloxicam  15 mg Oral Daily  . multivitamin with minerals  1 tablet Oral Daily  . nicotine  21 mg Transdermal Daily  . potassium chloride  20 mEq Oral BID  . propranolol  40 mg Oral Daily  . simvastatin  40 mg Oral q1800  . sodium chloride  3 mL Intravenous Q12H  . sodium chloride  3 mL Intravenous Q12H  . thiamine  100 mg Oral Daily   Or  . thiamine  100 mg Intravenous Daily   Continuous:  ZOX:WRUEAV chloride, acetaminophen, acetaminophen, alum & mag  hydroxide-simeth, HYDROcodone-acetaminophen, LORazepam, LORazepam, ondansetron (ZOFRAN) IV, ondansetron, sodium chloride, traMADol  Assesment: He has syncope. There was concern that he had a stroke but Dr. Gerilyn Pilgrim does not think that is the case. He had EEG which is pending. He is otherwise doing about the same. His fracture is improving he is able to ambulate better. He has atrial fibrillation which is a new diagnosis. He has hypertension. Active Problems:   * No active hospital problems. *    Plan: He'll have orthopedic and cardiology consultations. He will continue physical therapy et Karie Soda.    LOS: 2 days   Caleah Tortorelli L 09/28/2012, 10:43 AM

## 2012-09-29 ENCOUNTER — Telehealth: Payer: Self-pay | Admitting: Orthopedic Surgery

## 2012-09-29 ENCOUNTER — Inpatient Hospital Stay (HOSPITAL_COMMUNITY)
Admission: AD | Admit: 2012-09-29 | Discharge: 2012-09-29 | Disposition: A | Discharge status: Home / Self Care | Payer: Medicare Other | Source: Ambulatory Visit | Attending: Neurology | Admitting: Neurology

## 2012-09-29 DIAGNOSIS — I482 Chronic atrial fibrillation, unspecified: Secondary | ICD-10-CM | POA: Diagnosis present

## 2012-09-29 DIAGNOSIS — F1010 Alcohol abuse, uncomplicated: Secondary | ICD-10-CM | POA: Diagnosis present

## 2012-09-29 DIAGNOSIS — R55 Syncope and collapse: Secondary | ICD-10-CM | POA: Diagnosis present

## 2012-09-29 DIAGNOSIS — I10 Essential (primary) hypertension: Secondary | ICD-10-CM

## 2012-09-29 DIAGNOSIS — I4891 Unspecified atrial fibrillation: Secondary | ICD-10-CM

## 2012-09-29 DIAGNOSIS — S329XXA Fracture of unspecified parts of lumbosacral spine and pelvis, initial encounter for closed fracture: Secondary | ICD-10-CM

## 2012-09-29 DIAGNOSIS — G252 Other specified forms of tremor: Secondary | ICD-10-CM

## 2012-09-29 MED ORDER — LISINOPRIL 10 MG PO TABS
20.0000 mg | ORAL_TABLET | Freq: Once | ORAL | Status: AC
  Administered 2012-09-29: 20 mg via ORAL
  Filled 2012-09-29: qty 2

## 2012-09-29 MED ORDER — PRIMIDONE 50 MG PO TABS
50.0000 mg | ORAL_TABLET | Freq: Two times a day (BID) | ORAL | Status: DC
  Administered 2012-09-29 – 2012-09-30 (×3): 50 mg via ORAL
  Filled 2012-09-29 (×5): qty 1

## 2012-09-29 NOTE — Consult Note (Signed)
Reason for Consult: Right superior pubic ramus fracture Referring Physician: Dr. Joesphine Bare is an 77 y.o. male.  HPI: This is a 77 year old male who fell and apparently or possibly had a mild stroke he injured his right periorbital area and his right superior pubic ramus and presented with severe right groin and right leg pain which was nonradiating described as sharp associated with inability to bear weight on the right side.  Past Medical History  Diagnosis Date  . Exertional dyspnea   . 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 Hypertension   . Hyperlipidemia   . Benign essential tremor   . Tobacco abuse     30 pack years; discontinued in 2000; currently one pack per week  . Prostate cancer     S/P Prostatectomy    Past Surgical History  Procedure Laterality Date  . Meniscectomy      Right knee open meniscectomy  . Knee surgery      Left laparoscopic knee surgery  . Robot assisted laparoscopic radical prostatectomy    . Appendectomy    . Colonoscopy  2011  . Hernia repair      Family History  Problem Relation Age of Onset  . Alzheimer's disease Mother 90  . Early death Father 86    trauma  . Heart failure Brother     Social History:  reports that he has been smoking Cigarettes.  He has been smoking about 1.00 pack per day. He has never used smokeless tobacco. He reports that  drinks alcohol. He reports that he does not use illicit drugs.  Allergies: No Known Allergies  Medications: I have reviewed the patient's current medications.  No results found for this or any previous visit (from the past 48 hour(s)).  No results found.  Review of Systems  Constitutional: Negative.   HENT:       Right orbicular trauma with a bruise and tenderness  Eyes: Negative.   Respiratory: Negative for cough.   Cardiovascular: Negative for leg swelling.  Gastrointestinal: Negative.   Genitourinary: Negative.   Musculoskeletal: Positive for myalgias, joint pain and  falls.  Skin: Negative.   Neurological: Negative for speech change.  Endo/Heme/Allergies: Negative.   Psychiatric/Behavioral: Negative for hallucinations.   Blood pressure 155/79, pulse 48, temperature 97.3 F (36.3 C), temperature source Oral, resp. rate 20, height 5\' 9"  (1.753 m), weight 166 lb 6.4 oz (75.479 kg), SpO2 97.00%. Physical Exam  Constitutional: He is oriented to person, place, and time. He appears well-developed and well-nourished. No distress.  HENT:  Head: Normocephalic.  Eyes:  Periorbital tenderness and subcutaneous ecchymosis  Neck: Normal range of motion.  Cardiovascular: Intact distal pulses.   Respiratory: Effort normal.  GI: Soft. He exhibits no distension.  Musculoskeletal:       Right hip: He exhibits decreased range of motion. He exhibits normal strength, no tenderness, no bony tenderness, no swelling, no crepitus and no deformity.       Left hip: He exhibits normal range of motion, normal strength, no tenderness, no bony tenderness, no swelling, no crepitus, no deformity and no laceration.       Right knee: Normal.       Left knee: Normal.       Right ankle: Normal.       Left ankle: Normal.  Upper extremity exam  The right and left upper extremity:  Inspection and palpation revealed no abnormalities in the upper extremities.  Range of motion  is full without contracture. Motor exam is normal with grade 5 strength. The joints are fully reduced without subluxation. There is no atrophy or tremor and muscle tone is normal.  All joints are stable.      Lymphadenopathy:    He has no cervical adenopathy.  Neurological: He is alert and oriented to person, place, and time. He exhibits normal muscle tone.  Skin: Skin is warm and dry. He is not diaphoretic.  Psychiatric: He has a normal mood and affect. His behavior is normal. Judgment and thought content normal.    Assessment/Plan: Imaging shows a superior pubic ramus fracture minimally displaced in the  right side.  He was able to ambulate and stand today with a rolling walker.  He should do well with weightbearing as tolerated protocol in physical therapy for gait training with support  He will need a radiograph in approximately one month with an AP of the pelvis.  Pelvic fracture, superior pubic ramus  Plan weightbearing as tolerated x-ray in one month  Fuller Canada 09/29/2012, 11:44 AM

## 2012-09-29 NOTE — Care Management Note (Signed)
    Page 1 of 2   17-Nov-202014     10:03:38 AM   CARE MANAGEMENT NOTE 17-Nov-202014  Patient:  Peter Lopez, Peter Lopez   Account Number:  [id number]  Date Initiated:  09/29/2012  Documentation initiated by:  Sharrie Rothman  Subjective/Objective Assessment:   Pt admitted from home with possible CVA and syncopal episode. Pt lives with his wife and will return home at discharge. Pt is fairly independent with ADL's. Pt stated that he had a walker for home use. PT recommends outpt PT.     Action/Plan:   Pts appt for outpt PT secured and will relay information to pt. Will follow for any furthur DME needs.   Anticipated DC Date:  10/01/2012   Anticipated DC Plan:  HOME/SELF CARE      DC Planning Services  CM consult      PAC Choice  DURABLE MEDICAL EQUIPMENT   Choice offered to / List presented to:  C-1 Patient   DME arranged  [facility] RO[facility]      DME agency  [facility]        Status of service:  Completed, signed off Medicare Important Message given?  YES (If response is "NO", the following Medicare IM given date fields will be blank) Date Medicare IM given:  017-Nov-202014 Date Additional Medicare IM given:    Discharge Disposition:  HOME/SELF CARE  Per UR Regulation:    If discussed at Long Length of Stay Meetings, dates discussed:    Comments:  09/30/12 1000 Arlyss Queen, RN BSN CM Pt discharged home today. PT recommended outpt PT and pts first appt has been arranged. Rolling walker arranged with [facility] and family will pick walker up after discharge. Pt and pts nurse aware of discharge arrangements.  09/29/12 1107 Arlyss Queen, RN BSN CM

## 2012-09-29 NOTE — Progress Notes (Signed)
Patient ID: Peter Lopez, male   DOB: Mar 12, 1936, 77 y.o.   MRN: [id number]  [facility] NEUROLOGY Kathrin Folden A. Gerilyn Pilgrim, MD     [email or web address]          Peter Lopez is an 77 y.o. male.   Assessment/Plan: No new complaints are reported today. The patient is awake lucid and coherent. He continues to have ecchymosis and bruising on the right facial area. They are concerned about the patient's bradykinesia. Dr. Juanetta Gosling in the family reports that the patient was well controlled on the propanolol for a long time. The bradykinesia however is concerning. We may therefore want to try another medication. Mysoline may be a reasonable option. This does have side effects of causing drowsiness. If the dose started low increase slowly this will mitigate this potential side effect. The only other downside is that he cannot take this medication and drink large doses of alcohol as both have synergistic effects. Other potential medications includes Keppra and Topamax but these tend not to work as well as propranolol or Mysoline.   New onset atrial fibrillation. The patient should be on long-term anticoagulation given his age and history of hypertension but the history of syncope and falls is potential risk. This will have to be discussed at length with the patient.  Syncopal likely related to the effects of alcohol and medication. EEG is pending.      Objective: Vital signs in last 24 hours: Temp:  [97.3 F (36.3 C)-97.7 F (36.5 C)] 97.3 F (36.3 C) (02/10 0500) Pulse Rate:  [37-63] 51 (02/10 0500) Resp:  [20] 20 (02/10 0500) BP: (125-160)/(58-79) 125/66 mmHg (02/10 0500) SpO2:  [93 %-98 %] 96 % (02/10 0500) Weight:  [75.479 kg (166 lb 6.4 oz)] 75.479 kg (166 lb 6.4 oz) (02/10 0500)  Intake/Output from previous day: 02/09 0701 - 02/10 0700 In: 1323 [P.O.:1320; I.V.:3] Out: 1600 [Urine:1600] Intake/Output this shift:   Nutritional status: Cardiac   Lab Results: No results found for this  or any previous visit (from the past 48 hour(s)).  Lipid Panel No results found for this basename: CHOL, TRIG, HDL, CHOLHDL, VLDL, LDLCALC,  in the last 72 hours  Studies/Results: No results found.  Medications:  Scheduled Meds: . enoxaparin (LOVENOX) injection  40 mg Subcutaneous Q24H  . folic acid  0.5 mg Oral Daily  . lisinopril  20 mg Oral Daily   And  . hydrochlorothiazide  12.5 mg Oral Daily  . meloxicam  15 mg Oral Daily  . multivitamin with minerals  1 tablet Oral Daily  . nicotine  21 mg Transdermal Daily  . potassium chloride  20 mEq Oral BID  . propranolol  40 mg Oral Daily  . simvastatin  40 mg Oral q1800  . sodium chloride  3 mL Intravenous Q12H  . sodium chloride  3 mL Intravenous Q12H  . thiamine  100 mg Oral Daily   Or  . thiamine  100 mg Intravenous Daily   Continuous Infusions:  PRN Meds:.sodium chloride, acetaminophen, acetaminophen, alum & mag hydroxide-simeth, HYDROcodone-acetaminophen, LORazepam, LORazepam, ondansetron (ZOFRAN) IV, ondansetron, sodium chloride, traMADol    LOS: 3 days   Chellsea Beckers A. Gerilyn Pilgrim, M.D.  Diplomate, Biomedical engineer of Psychiatry and Neurology ( Neurology).

## 2012-09-29 NOTE — Progress Notes (Signed)
EEG completed.

## 2012-09-29 NOTE — Progress Notes (Signed)
Physical Therapy Treatment Patient Details Name: Peter Lopez MRN: [id number] DOB: 1935/09/11 Today's Date: 09/29/2012 Time: 7829-5621 PT Time Calculation (min): 32 min  PT Assessment / Plan / Recommendation Comments on Treatment Session  Pt has met all acute care goals.  He will follow up with outpatient PT    Follow Up Recommendations        Does the patient have the potential to tolerate intense rehabilitation     Barriers to Discharge        Equipment Recommendations       Recommendations for Other Services    Frequency     Plan Discharge plan remains appropriate    Precautions / Restrictions Restrictions Weight Bearing Restrictions: No   Pertinent Vitals/Pain     Mobility  Bed Mobility Supine to Sit: 7: Independent Sitting - Scoot to Edge of Bed: 7: Independent Transfers Sit to Stand: 6: Modified independent (Device/Increase time);With upper extremity assist Stand to Sit: 6: Modified independent (Device/Increase time);With upper extremity assist Ambulation/Gait Ambulation/Gait Assistance: 6: Modified independent (Device/Increase time) Ambulation Distance (Feet): 200 Feet Assistive device: Rolling walker Gait Pattern: Within Functional Limits General Gait Details: pt instructed in gait with front wheeled walker.  He is more stable with this.  Pt has borrowed the rollator.  We will have a 2 wheeled walker ordered. Stairs: Yes Stairs Assistance: 5: Supervision Stair Management Technique: Two rails;Step to pattern;Backwards;Forwards;With walker Number of Stairs: 3 Wheelchair Mobility Wheelchair Mobility: No    Exercises     PT Diagnosis:    PT Problem List:   PT Treatment Interventions:     PT Goals Acute Rehab PT Goals PT Goal: Supine/Side to Sit - Progress: Met PT Goal: Sit to Stand - Progress: Met PT Goal: Stand to Sit - Progress: Met PT Goal: Ambulate - Progress: Met PT Goal: Up/Down Stairs - Progress: Met PT Goal: Perform Home Exercise Program -  Progress: Met  Visit Information  Last PT Received On: 09/29/12    Subjective Data  Subjective: I'm feeling much better   Cognition       Balance     End of Session PT - End of Session Equipment Utilized During Treatment: Gait belt Activity Tolerance: Patient tolerated treatment well Patient left: in chair   GP     [name] 09/29/2012, 2:08 PM

## 2012-09-29 NOTE — Plan of Care (Signed)
Problem: Phase II Progression Outcomes Goal: Tolerates increased mobility Outcome: Completed/Met Date Met:  09/29/12 Pt ambulated with PT today using four wheel walker.

## 2012-09-29 NOTE — Progress Notes (Signed)
Subjective: He has no complaints. He feels ok he is not aware of  A fib  Objective: Vital signs in last 24 hours: Temp:  [97.3 F (36.3 C)-97.7 F (36.5 C)] 97.5 F (36.4 C) (02/10 1529) Pulse Rate:  [37-62] 55 (02/10 1734) Resp:  [20] 20 (02/10 0500) BP: (125-180)/(58-79) 156/78 mmHg (02/10 1734) SpO2:  [94 %-98 %] 97 % (02/10 1734) Weight:  [75.479 kg (166 lb 6.4 oz)] 75.479 kg (166 lb 6.4 oz) (02/10 0500) Weight change: -0.022 kg (-0.8 oz) Last BM Date: 09/28/12 (per aptient)  Intake/Output from previous day: 02/09 0701 - 02/10 0700 In: 1323 [P.O.:1320; I.V.:3] Out: 1600 [Urine:1600]  PHYSICAL EXAM General appearance: alert and cooperative Resp: clear to auscultation bilaterally Cardio: irregularly irregular rhythm GI: soft, non-tender; bowel sounds normal; no masses,  no organomegaly Extremities: extremities normal, atraumatic, no cyanosis or edema  Lab Results:    Basic Metabolic Panel: No results found for this basename: NA, K, CL, CO2, GLUCOSE, BUN, CREATININE, CALCIUM, MG, PHOS,  in the last 72 hours Liver Function Tests: No results found for this basename: AST, ALT, ALKPHOS, BILITOT, PROT, ALBUMIN,  in the last 72 hours No results found for this basename: LIPASE, AMYLASE,  in the last 72 hours No results found for this basename: AMMONIA,  in the last 72 hours CBC: No results found for this basename: WBC, NEUTROABS, HGB, HCT, MCV, PLT,  in the last 72 hours Cardiac Enzymes: No results found for this basename: CKTOTAL, CKMB, CKMBINDEX, TROPONINI,  in the last 72 hours BNP: No results found for this basename: PROBNP,  in the last 72 hours D-Dimer: No results found for this basename: DDIMER,  in the last 72 hours CBG: No results found for this basename: GLUCAP,  in the last 72 hours Hemoglobin A1C: No results found for this basename: HGBA1C,  in the last 72 hours Fasting Lipid Panel: No results found for this basename: CHOL, HDL, LDLCALC, TRIG, CHOLHDL,  LDLDIRECT,  in the last 72 hours Thyroid Function Tests: No results found for this basename: TSH, T4TOTAL, FREET4, T3FREE, THYROIDAB,  in the last 72 hours Anemia Panel: No results found for this basename: VITAMINB12, FOLATE, FERRITIN, TIBC, IRON, RETICCTPCT,  in the last 72 hours Coagulation: No results found for this basename: LABPROT, INR,  in the last 72 hours Urine Drug Screen: Drugs of Abuse  No results found for this basename: labopia, cocainscrnur, labbenz, amphetmu, thcu, labbarb    Alcohol Level: No results found for this basename: ETH,  in the last 72 hours Urinalysis: No results found for this basename: COLORURINE, APPERANCEUR, LABSPEC, PHURINE, GLUCOSEU, HGBUR, BILIRUBINUR, KETONESUR, PROTEINUR, UROBILINOGEN, NITRITE, LEUKOCYTESUR,  in the last 72 hours Misc. Labs:  ABGS No results found for this basename: PHART, PCO2, PO2ART, TCO2, HCO3,  in the last 72 hours CULTURES No results found for this or any previous visit (from the past 240 hour(s)). Studies/Results: No results found.  Medications:  Prior to Admission:  Prescriptions prior to admission  Medication Sig Dispense Refill  . amLODipine-olmesartan (AZOR) 5-40 MG per tablet Take 1 tablet by mouth daily.      Marland Kitchen aspirin 81 MG tablet Take 81 mg by mouth daily.        . folic acid (FOLVITE) 400 MCG tablet Take 400 mcg by mouth 2 (two) times daily.        . methotrexate (RHEUMATREX) 2.5 MG tablet Take 1 mg by mouth once a week.      . propranolol (INDERAL) 40 MG  tablet Take 40 mg by mouth daily.        . simvastatin (ZOCOR) 40 MG tablet Take 40 mg by mouth daily.         Scheduled: . enoxaparin (LOVENOX) injection  40 mg Subcutaneous Q24H  . folic acid  0.5 mg Oral Daily  . lisinopril  20 mg Oral Daily   And  . hydrochlorothiazide  12.5 mg Oral Daily  . meloxicam  15 mg Oral Daily  . multivitamin with minerals  1 tablet Oral Daily  . nicotine  21 mg Transdermal Daily  . potassium chloride  20 mEq Oral BID  .  primidone  50 mg Oral BID  . propranolol  40 mg Oral Daily  . simvastatin  40 mg Oral q1800  . sodium chloride  3 mL Intravenous Q12H  . sodium chloride  3 mL Intravenous Q12H  . thiamine  100 mg Oral Daily   Or  . thiamine  100 mg Intravenous Daily   Continuous:  MWU:XLKGMW chloride, acetaminophen, acetaminophen, alum & mag hydroxide-simeth, HYDROcodone-acetaminophen, LORazepam, LORazepam, ondansetron (ZOFRAN) IV, ondansetron, sodium chloride, traMADol Assesment:he has ha d syncope. He has a fib . He feels better. He has pelvic nfracture Active Problems:   Hyperlipidemia   TOBACCO ABUSE   Sick sinus syndrome   Hypertension   Atrial fibrillation, new onset   Syncope   ETOH abuse    Plan:no change. Potential discharge tomorrow    LOS: 3 days   Carlita Whitcomb L 09/29/2012, 8:08 PM

## 2012-09-29 NOTE — Consult Note (Addendum)
CARDIOLOGY CONSULT NOTE  Patient ID: YERAY TOMAS MRN: [id number] DOB/AGE: November 25, 1935 77 y.o.  Admit date: 09/26/2012 Referring Physicians: Kari Baars MD Primary Herb Grays, MD Primary Cardiologist: Abingdon Bing MD Reason for Consultation: New Onset Atrial fib, syncope Active Problems:   Atrial fibrillation, new onset   Syncope   Hyperlipidemia   TOBACCO ABUSE   CARDIAC CONDUCTION DISTURBANCE   Hypertension   ETOH abuse  HPI: Mr. Corp is a 77 y/o patient admitted with syncope and new onset atrial fibrillation after drinking heavily while on vacation in [place]. He sustained a facial contusion, with CT demonstrating acute CVA in [facility] ER, at that time.He was also found to be in atrial fibrillation per family who brought copy of EKG to Dr. Juanetta Gosling .  He was seen in Dr. Juanetta Gosling office after returning. He was having difficulty ambulating,weakness in his right leg and unable to bear weight on right leg. He was found to have a pelvic fracture. He remained in atrial fib. Seen by neurology and ruled out for acute CVA, and  EEG is pending. MRI demonstrates, probable small area of acute cortical infarct left frontal lobe.     He was last seen by Dr. Dietrich Pates in April of 2013 for ongoing assessment of conduction disorder, bradycardia with discussion in the note for need for pacemaker ":at some point."  He was found to be bradycardic overnight, with HR 37 bpm, atrial fib. He was on propranolol 40 mg daily at home for tremors. He was taken off of propanolol by Dr. Dietrich Pates. He was found to be hypertensive and HCTZ was added to medication regimen that visit. However, tremors got worse, and propanolol was reinstitued. Lisinopril/ HCTZ was discontinued and he was placed on amilodipine and olmesartan.   Review of systems complete and found to be negative unless listed above   Past Medical History  Diagnosis Date  . Exertional dyspnea   . AV bloc first degree 2011    Profound   . Hypertension   . Hyperlipidemia   . Benign essential tremor   . Tobacco abuse     30 pack years; discontinued in 2000; currently one pack per week  . Prostate cancer     S/P Prostatectomy    Family History  Problem Relation Age of Onset  . Alzheimer's disease Mother 59  . Early death Father 84    trauma  . Heart failure Brother     History   Social History  . Marital Status: Married    Spouse Name: N/A    Number of Children: N/A  . Years of Education: N/A   Occupational History  . Company secretary    Social History Main Topics  . Smoking status: Current Every Day Smoker -- 1.00 packs/day    Types: Cigarettes    Last Attempt to Quit: 12/11/1998  . Smokeless tobacco: Never Used  . Alcohol Use: Yes     Comment: modest  . Drug Use: No  . Sexually Active: Not on file   Other Topics Concern  . Not on file   Social History Narrative  . No narrative on file    Past Surgical History  Procedure Laterality Date  . Meniscectomy      Right knee open meniscectomy  . Knee surgery      Left laparoscopic knee surgery  . Robot assisted laparoscopic radical prostatectomy    . Appendectomy    . Colonoscopy  2011  . Hernia repair  Prescriptions prior to admission  Medication Sig Dispense Refill  . amLODipine-olmesartan (AZOR) 5-40 MG per tablet Take 1 tablet by mouth daily.      Marland Kitchen aspirin 81 MG tablet Take 81 mg by mouth daily.        . folic acid (FOLVITE) 400 MCG tablet Take 400 mcg by mouth 2 (two) times daily.        . methotrexate (RHEUMATREX) 2.5 MG tablet Take 1 mg by mouth once a week.      . propranolol (INDERAL) 40 MG tablet Take 40 mg by mouth daily.        . simvastatin (ZOCOR) 40 MG tablet Take 40 mg by mouth daily.         Echocardiogram 09/26/2012 Left ventricle: Normal size, wall thickness, regional and global function; mild systolic septal flattening.  moderate bi-atrial enlargement; right-sided enlargement with RVH and mildly decreased RV  contractility;   mild to moderate pulmonary hypertension.   Physical Exam: Blood pressure 125/66, pulse 51, temperature 97.3 F (36.3 C), temperature source Oral, resp. rate 20, height 5\' 9"  (1.753 m), weight 166 lb 6.4 oz (75.479 kg), SpO2 96.00%. General: Well developed, well nourished, in no acute distress Head: Eyes PERRLA, No xanthomas.Contusion lateral to the right orbit. Lungs: Clear bilaterally to auscultation and percussion; increased AP diameter; decreased breath sounds. Heart: HRRR S1 S2, occasional irregular beat, bradycardic without RG.  Grade 1/6 holosystolic murmur at the left sternal border and apex ; Pulses are 2+ & equal.  No carotid bruit. No JVD.  No abdominal bruits. No femoral bruits. Abdomen: Bowel sounds are positive, abdomen soft and non-tender without masses or                  Hernia's noted. Msk:  Back normal, normal gait. Normal strength and tone for age. Extremities: No clubbing, cyanosis or edema. Weakness and pain lifting the right leg. DP +1 Neuro: Alert and oriented X 3. Psych:  Good affect, responds appropriately    Lab Results  Component Value Date   WBC 10.3 09/26/2012   HGB 16.1 09/26/2012   HCT 47.2 09/26/2012   MCV 92.7 09/26/2012   PLT 158 09/26/2012    Recent Labs Lab 09/26/12 1344  NA 135  K 3.4*  CL 97  CO2 24  BUN 16  CREATININE 0.57  CALCIUM 9.4  PROT 7.3  BILITOT 3.4*  ALKPHOS 174*  ALT 28  AST 30  GLUCOSE 185*   MRI 09/26/12 Findings: Incomplete study.  Small hyperintensity left frontal cortex on diffusion weighted imaging is most likely acute infarct.  Age appropriate atrophy.   EKG:  Junctional bradycardia; delayed R-wave progression; leftward axis; right ventricular conduction delay Telemetry: Atrial fib with slow ventricular response; lowest recorded ventricular rates in the mid-30s noted during sleep; frequent PVCs intermittently; junctional rhythm; some intervals appear to represent sinus rhythm.  ASSESSMENT AND PLAN:   1.  New Onset Atrial fibrillation:  Heart rate is slow with evidence of bradycardia into the 30's overnight. He is unaware of his irregular heart rate and is no longer complaining of dizziness. He continues complaints of overall weakness and easy fatigue. He denies chest pain. CHADs score of 2-4 with age and HTN and possible CVA. However, with excessive drinking history and frequent falls, he is not a candidate for anticoagulation at this time. He may be considered for pacemaker implantation is the setting of bradycardia with syncope, which is uncertain to have been the etiology for loss  of  consciousness.   2. Syncopal Episode: No definitive etiology at this time.. ? Acute CVA, excessive ETOH ingestion, bradycardia.  3. Hypertension: Currently well controlled. He is now back on lisinopril and HCTZ with good response. Orthostatics will be difficult as he is unsteady on his feet due to pelvic fracture.  4. Acute CVA: Work up per Dr.[place]. MRI abnormal  5. Pelvic fx due to fall 6. Excessive Alcohol Use 7. Hypercholestrolemia 8. Prostate Cancer-s/p radical prostectomy  Bettey Mare. Lyman Bishop NP [facility] 09/29/2012, 9:41 AM  Cardiology Attending Patient interviewed and examined. Discussed with Joni Reining, NP.  Above note annotated and modified based upon my findings.  Case discussed at length with patient and his family. With a history of conduction system disease and now with sick sinus syndrome, possibility of a bradycardic event causing loss of consciousness is the main immediate consideration; however, patient experienced 35 minutes of insensibility, which is for longer than would be caused by most bradycardia arrhythmias. The possibility of a cerebral CVA is of concern for possible cardiac thromboembolism. A unilateral frontal lesion would not cause prolonged loss of consciousness unless accompanied by a seizure. Patient denies any incontinence or oral injury.  Exam suggests the  presence of significant mitral or tricuspid regurgitation, but this was not verified on echocardiography. Instead, he appears to have significant although not profound pulmonary hypertension, likely as the result of COPD.  There was no CO2 retention on his most recent blood gas 2.5 years ago nor was there any hypoxemia. PFTs and consideration of possible sleep apnea are appropriate, perhaps as an outpatient. The state law regarding prolongation of the driving for 6 months after a non-explained loss of consciousness was discussed with patient and his family.  Past trial off of propranolol resulted in an unacceptable degree of tremor. It is not clear that beta blocker has caused significant problems, but it would be preferable if that class of medication could be avoided. Trial of Mysoline has been initiated and hopefully will be effective enough to allow discontinuation of propranolol.  Additional in hospital cardiology assessment is not necessary. Patient can be discharged with an event recorder and followed in our office. Abstinence from alcohol during this interval would be highly desirable.  Tracy Bing, MD 09/29/2012, 6:24 PM

## 2012-09-29 NOTE — Telephone Encounter (Signed)
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

## 2012-09-29 NOTE — Progress Notes (Signed)
UR Chart Review Completed  

## 2012-09-29 NOTE — Progress Notes (Signed)
Pt's BP 180/75.  Heart rate of 53.  Pt resting in bed.  Has visitor in the room.  Pt voice's no complaints.  Dr. Juanetta Gosling notified.  Received order for one time dose of Lisinopril 20 mg.  Dr. Juanetta Gosling to see pt this evening.  Orders followed.

## 2012-09-30 ENCOUNTER — Other Ambulatory Visit: Payer: Self-pay | Admitting: *Deleted

## 2012-09-30 DIAGNOSIS — I4891 Unspecified atrial fibrillation: Secondary | ICD-10-CM

## 2012-09-30 DIAGNOSIS — M1610 Unilateral primary osteoarthritis, unspecified hip: Secondary | ICD-10-CM

## 2012-09-30 MED ORDER — PRIMIDONE 50 MG PO TABS: 50.0000 mg | ORAL_TABLET | Freq: Two times a day (BID) | ORAL | Status: DC

## 2012-09-30 MED ORDER — POTASSIUM CHLORIDE CRYS ER 20 MEQ PO TBCR: 20.0000 meq | EXTENDED_RELEASE_TABLET | Freq: Every day | ORAL | Status: DC

## 2012-09-30 MED ORDER — TRAMADOL HCL 50 MG PO TABS: 50.0000 mg | ORAL_TABLET | Freq: Four times a day (QID) | ORAL | Status: DC | PRN

## 2012-09-30 NOTE — Progress Notes (Signed)
  [facility] Chanceler Pullin A. Gerilyn Pilgrim, MD     [email or web address]          Peter Lopez is an 77 y.o. male.   Assessment/Plan: The patient has tolerated the Mysoline so far. He thinks it has been helpful. Side effects were reported at this time. Again, I discussed the patient that drowsiness is a potential side effect of this medication. My suggestion is that he avoid drinking alcohol while taking this medication. An EEG has been done and will be followed.     Objective: Vital signs in last 24 hours: Temp:  [97.1 F (36.2 C)-97.5 F (36.4 C)] 97.2 F (36.2 C) (02/11 0651) Pulse Rate:  [48-61] 61 (02/11 0651) Resp:  [19-20] 20 (02/11 0651) BP: (151-180)/(63-79) 155/78 mmHg (02/11 0651) SpO2:  [95 %-100 %] 96 % (02/11 0651) Weight:  [73.664 kg (162 lb 6.4 oz)] 73.664 kg (162 lb 6.4 oz) (02/11 0334)  Intake/Output from previous day: 02/10 0701 - 02/11 0700 In: 240 [P.O.:240] Out: 1075 [Urine:1075] Intake/Output this shift:   Nutritional status: Cardiac   Lab Results: No results found for this or any previous visit (from the past 48 hour(s)).  Lipid Panel No results found for this basename: CHOL, TRIG, HDL, CHOLHDL, VLDL, LDLCALC,  in the last 72 hours  Studies/Results: No results found.  Medications:  Scheduled Meds: . enoxaparin (LOVENOX) injection  40 mg Subcutaneous Q24H  . folic acid  0.5 mg Oral Daily  . lisinopril  20 mg Oral Daily   And  . hydrochlorothiazide  12.5 mg Oral Daily  . meloxicam  15 mg Oral Daily  . multivitamin with minerals  1 tablet Oral Daily  . nicotine  21 mg Transdermal Daily  . potassium chloride  20 mEq Oral BID  . primidone  50 mg Oral BID  . propranolol  40 mg Oral Daily  . simvastatin  40 mg Oral q1800  . sodium chloride  3 mL Intravenous Q12H  . sodium chloride  3 mL Intravenous Q12H  . thiamine  100 mg Oral Daily   Or  . thiamine  100 mg Intravenous Daily   Continuous Infusions:  PRN Meds:.sodium chloride,  acetaminophen, acetaminophen, alum & mag hydroxide-simeth, HYDROcodone-acetaminophen, LORazepam, LORazepam, ondansetron (ZOFRAN) IV, ondansetron, sodium chloride, traMADol     LOS: 4 days   Arnelle Nale A. Gerilyn Pilgrim, M.D.  Diplomate, Biomedical engineer of Psychiatry and Neurology ( Neurology).

## 2012-09-30 NOTE — Progress Notes (Signed)
Subjective: He says he feels well. He has no new complaints. His heart rate is better.  Objective: Vital signs in last 24 hours: Temp:  [97.1 F (36.2 C)-97.5 F (36.4 C)] 97.2 F (36.2 C) (02/11 0651) Pulse Rate:  [48-61] 61 (02/11 0651) Resp:  [19-20] 20 (02/11 0651) BP: (151-180)/(63-79) 155/78 mmHg (02/11 0651) SpO2:  [95 %-100 %] 96 % (02/11 0651) Weight:  [73.664 kg (162 lb 6.4 oz)] 73.664 kg (162 lb 6.4 oz) (02/11 0334) Weight change: -1.814 kg (-4 lb) Last BM Date: 09/28/12  Intake/Output from previous day: 02/10 0701 - 02/11 0700 In: 240 [P.O.:240] Out: 1075 [Urine:1075]  PHYSICAL EXAM General appearance: alert, cooperative and no distress Resp: clear to auscultation bilaterally Cardio: irregularly irregular rhythm GI: soft, non-tender; bowel sounds normal; no masses,  no organomegaly Extremities: extremities normal, atraumatic, no cyanosis or edema  Lab Results:    Basic Metabolic Panel: No results found for this basename: NA, K, CL, CO2, GLUCOSE, BUN, CREATININE, CALCIUM, MG, PHOS,  in the last 72 hours Liver Function Tests: No results found for this basename: AST, ALT, ALKPHOS, BILITOT, PROT, ALBUMIN,  in the last 72 hours No results found for this basename: LIPASE, AMYLASE,  in the last 72 hours No results found for this basename: AMMONIA,  in the last 72 hours CBC: No results found for this basename: WBC, NEUTROABS, HGB, HCT, MCV, PLT,  in the last 72 hours Cardiac Enzymes: No results found for this basename: CKTOTAL, CKMB, CKMBINDEX, TROPONINI,  in the last 72 hours BNP: No results found for this basename: PROBNP,  in the last 72 hours D-Dimer: No results found for this basename: DDIMER,  in the last 72 hours CBG: No results found for this basename: GLUCAP,  in the last 72 hours Hemoglobin A1C: No results found for this basename: HGBA1C,  in the last 72 hours Fasting Lipid Panel: No results found for this basename: CHOL, HDL, LDLCALC, TRIG, CHOLHDL,  LDLDIRECT,  in the last 72 hours Thyroid Function Tests: No results found for this basename: TSH, T4TOTAL, FREET4, T3FREE, THYROIDAB,  in the last 72 hours Anemia Panel: No results found for this basename: VITAMINB12, FOLATE, FERRITIN, TIBC, IRON, RETICCTPCT,  in the last 72 hours Coagulation: No results found for this basename: LABPROT, INR,  in the last 72 hours Urine Drug Screen: Drugs of Abuse  No results found for this basename: labopia, cocainscrnur, labbenz, amphetmu, thcu, labbarb    Alcohol Level: No results found for this basename: ETH,  in the last 72 hours Urinalysis: No results found for this basename: COLORURINE, APPERANCEUR, LABSPEC, PHURINE, GLUCOSEU, HGBUR, BILIRUBINUR, KETONESUR, PROTEINUR, UROBILINOGEN, NITRITE, LEUKOCYTESUR,  in the last 72 hours Misc. Labs:  ABGS No results found for this basename: PHART, PCO2, PO2ART, TCO2, HCO3,  in the last 72 hours CULTURES No results found for this or any previous visit (from the past 240 hour(s)). Studies/Results: No results found.  Medications:  Prior to Admission:  Prescriptions prior to admission  Medication Sig Dispense Refill  . amLODipine-olmesartan (AZOR) 5-40 MG per tablet Take 1 tablet by mouth daily.      Marland Kitchen aspirin 81 MG tablet Take 81 mg by mouth daily.        . folic acid (FOLVITE) 400 MCG tablet Take 400 mcg by mouth 2 (two) times daily.        . methotrexate (RHEUMATREX) 2.5 MG tablet Take 1 mg by mouth once a week.      . simvastatin (ZOCOR) 40 MG tablet  Take 40 mg by mouth daily.        . [DISCONTINUED] propranolol (INDERAL) 40 MG tablet Take 40 mg by mouth daily.         Scheduled: . enoxaparin (LOVENOX) injection  40 mg Subcutaneous Q24H  . folic acid  0.5 mg Oral Daily  . lisinopril  20 mg Oral Daily   And  . hydrochlorothiazide  12.5 mg Oral Daily  . meloxicam  15 mg Oral Daily  . multivitamin with minerals  1 tablet Oral Daily  . nicotine  21 mg Transdermal Daily  . potassium chloride  20 mEq  Oral BID  . primidone  50 mg Oral BID  . propranolol  40 mg Oral Daily  . simvastatin  40 mg Oral q1800  . sodium chloride  3 mL Intravenous Q12H  . sodium chloride  3 mL Intravenous Q12H  . thiamine  100 mg Oral Daily   Or  . thiamine  100 mg Intravenous Daily   Continuous:  ZOX:WRUEAV chloride, acetaminophen, acetaminophen, alum & mag hydroxide-simeth, HYDROcodone-acetaminophen, LORazepam, LORazepam, ondansetron (ZOFRAN) IV, ondansetron, sodium chloride, traMADol  Assesment: He has new-onset atrial fib and had a syncopal episode that was probably multifactorial. He is improved. He also has a pelvic fracture. I think he is well enough to go home now Active Problems:   Hyperlipidemia   TOBACCO ABUSE   Sick sinus syndrome   Hypertension   Atrial fibrillation, new onset   Syncope   ETOH abuse    Plan: Discharge home he will have an event monitor    LOS: 4 days   Kailo Kosik L June 17, 202014, 8:39 AM

## 2012-09-30 NOTE — Progress Notes (Signed)
09/30/12 1054 Patient discharged home this morning with wife. Reviewed discharge instructions with them, given copy of instructions, medication list, f/u appointment information, prescriptions called in to [facility] pharmacy per MD. Verbalized understanding of instructions and when to follow-up. Outpatient PT and rolling walker ordered/set up per case management. IV site d/c'd and within normal limits. Pt to wear event monitor at discharge per Dr Juanetta Gosling. Notified [facility], monitor applied and reviewed with patient/wife per [facility] nurse. Stated they understood and would call with any questions/concerns. Pt left floor in stable condition via w/c accompanied by NT. Earnstine Regal, RN

## 2012-10-02 ENCOUNTER — Ambulatory Visit (HOSPITAL_COMMUNITY): Payer: Medicare Other | Admitting: Physical Therapy

## 2012-10-03 ENCOUNTER — Ambulatory Visit (HOSPITAL_COMMUNITY)
Admission: RE | Admit: 2012-10-03 | Discharge: 2012-10-03 | Disposition: A | Discharge status: Home / Self Care | Payer: Medicare Other | Source: Ambulatory Visit | Attending: Pulmonary Disease | Admitting: Pulmonary Disease

## 2012-10-03 DIAGNOSIS — IMO0001 Reserved for inherently not codable concepts without codable children: Secondary | ICD-10-CM | POA: Insufficient documentation

## 2012-10-03 DIAGNOSIS — R262 Difficulty in walking, not elsewhere classified: Secondary | ICD-10-CM | POA: Insufficient documentation

## 2012-10-03 DIAGNOSIS — I10 Essential (primary) hypertension: Secondary | ICD-10-CM | POA: Insufficient documentation

## 2012-10-03 DIAGNOSIS — M25559 Pain in unspecified hip: Secondary | ICD-10-CM | POA: Insufficient documentation

## 2012-10-03 NOTE — Procedures (Signed)
[facility] Edla Para A. Gerilyn Pilgrim, MD     [email or web address]        NAMEBERTEL, VENARD                ACCOUNT NO.:  [id number]  MEDICAL RECORD NO.:  [id number]  LOCATION:  A315                          FACILITY:  [facility]  PHYSICIAN:  Sedalia Greeson A. Gerilyn Pilgrim, M.D. DATE OF BIRTH:  1936-03-05  DATE OF PROCEDURE:  09/29/2012 DATE OF DISCHARGE:  January 16, 202014                             EEG INTERPRETATION   INDICATION:  A 77 year old man, who presents with confusion and altered mental status.  The study is being done to evaluate for seizures.  MEDICATIONS:  Amlodipine, aspirin, folic acid, methotrexate, simvastatin, potassium, Ultram.  ANALYSIS:  A 16-channel recording is conducted for approximately 20 minutes.  There is a well-formed posterior dominant rhythm of 10 Hz, which attenuates with eye opening.  The frontal recording shows continuous insignificant myogenic artifact.  Awake and drowsy activities are recorded.  Photic stimulation is carried out without abnormal changes in the background activity.  There is no focal or lateralized slowing.  There is no epileptiform activity observed.  IMPRESSION:  Normal recording of awake and drowsy states.     Aldea Avis A. Gerilyn Pilgrim, M.D.     KAD/MEDQ  D:  10/03/2012  T:  10/03/2012  Job:  [id number]

## 2012-10-03 NOTE — Evaluation (Signed)
Physical Therapy Evaluation  Patient Details  Name: Peter Lopez MRN: [id number] Date of Birth: Dec 09, 1935  Today's Date: 10/03/2012 Time: 4098-1191 PT Time Calculation (min): 49 min Charges: 1 eval, 15' TE Visit#: 1 of 8  Re-eval: 11/02/12 Assessment Diagnosis: R stable pelvic fracture Surgical Date: 09/29/12 Next MD Visit: Peter Lopez 4 weeks; Peter Lopez - 4 weeks  Authorization: Medicare  Authorization Time Period:    Authorization Visit#: 1 of 10   Past Medical History:  Past Medical History  Diagnosis Date  . Exertional dyspnea   . 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 Hypertension   . Hyperlipidemia   . Benign essential tremor   . Tobacco abuse     30 pack years; discontinued in 2000; currently one pack per week  . Prostate cancer     S/P Prostatectomy   Past Surgical History:  Past Surgical History  Procedure Laterality Date  . Meniscectomy      Right knee open meniscectomy  . Knee surgery      Left laparoscopic knee surgery  . Robot assisted laparoscopic radical prostatectomy    . Appendectomy    . Colonoscopy  2011  . Hernia repair      Subjective Symptoms/Limitations Symptoms: Pt is referred to PT for generalized weakness s/p a fall w/a L stable pelvic fracture.  He was d/c from the hospital on Tuesday and since then he has been walking around a lot at home.  His c/co is decreased independence, pain to inguinal region, decreased strength and impaired confidence with mobility.  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  Comments: He enjoys attending sporting events and being with his family and friends.   Cognition/Observation Observation/Other Assessments Observations: bruising to R side of face and hip secondary to fall  Sensation/Coordination/Flexibility/Functional Tests Functional Tests Functional Tests: 5 STS: 15 sec Functional Tests: ABC: 75%  Assessment RLE Strength Right Hip Flexion: 3-/5 (pain) Right Hip Extension: 2/5 Right Hip ABduction: 2/5 Right Hip ADduction: 2+/5 Right Knee Flexion: 5/5 Right Knee Extension: 5/5  Mobility/Balance  Ambulation/Gait Ambulation/Gait Assistance: 7: Independent Assistive device: None Gait Pattern: Decreased stance time - right Static Standing Balance Single Leg Stance - Right Leg: 0 Single Leg Stance - Left Leg: 0 Tandem Stance - Right Leg: 0 Tandem Stance - Left Leg: 0 Rhomberg - Eyes Opened: 10 Rhomberg - Eyes Closed: 10   Exercise/Treatments Standing Heel Raises: 10 reps;Limitations Heel Raises Limitations: Toe Raises 10 reps Functional Squat: 10 reps Gait Training: RW, SPC and independent x6 minutes  Other Standing Knee Exercises: Hip Abd 10 reps; hip extension 10 reps Supine Short Arc Quad Sets: Right;10 reps Heel Slides: Right;5 reps Straight Leg Raises: AROM;10 reps Sidelying Hip ABduction: AAROM;Right;10 reps Prone  Hamstring Curl: 10 reps Other Prone Exercises: TKE 10 reps    Physical Therapy Assessment and Plan PT Assessment and Plan Clinical Impression Statement: Pt is a 77 year old male referred to PT s/p fall at home and sustained a minimally displaced R pubic fracture and had possible  stoke.  He was d/c from the hospital on 09/30/12 w/a RW and has been using it at home for ambulating. Pt will benefit from skilled therapeutic intervention in order to improve on the following deficits: Abnormal gait;Decreased activity tolerance;Decreased strength;Difficulty  walking;Pain;Impaired perceived functional ability PT Frequency: Min 2X/week PT Duration: 4 weeks PT Treatment/Interventions: Gait training;Stair training;Therapeutic activities;Therapeutic exercise;Balance training;Patient/family education;Manual techniques PT Plan: TM for gait mechanics and endurance.  rockerboard, balance gait activities, tandem stance, SLS, wall sits (w/ball or without), mat activities to strengthen R LE.  Continue to improve overall hip abduction strength and improve balance.  May add LE strengtheing as needed to decrease pain.     Goals Home Exercise Program Pt will Perform Home Exercise Program: Independently PT Goal: Perform Home Exercise Program - Progress: Goal set today PT Short Term Goals Time to Complete Short Term Goals: 2 weeks PT Short Term Goal 1: Pt will improve activity tolerance and ambulate independently for 15 minutes in indoor environment for improved independence PT Short Term Goal 2: Pt will improve LE strength by 1 muscle grade and ascend and descend 10 staris w/step to pattern for easy entry to basketball games.  PT Short Term Goal 3: Pt will improve dynamic balance and begin independent outdoor ambulation.  PT Short Term Goal 4: Pt will report pain less than 3/10 for 75% of his day.  PT Long Term Goals Time to Complete Long Term Goals: 4 weeks PT Long Term Goal 1: Pt will improve his ABC to greater than 80% for improved QOL.  PT Long Term Goal 2: Pt will improve LE strength to [facility] in order to ascend and descend 15 stairs without handrails w/reciprocal pattern for greater ease to watch sporting events.  Long Term Goal 3: Pt will improve his dynamic balance and ambulate independently on outdoor surfaces.    Problem List Patient Active Problem List  Diagnosis  . ADENOCARCINOMA, PROSTATE  . Hyperlipidemia  . TOBACCO ABUSE  . TREMOR, ESSENTIAL  . Sick sinus syndrome  . Hypertension  . Atrial fibrillation, new onset  . Syncope  . ETOH abuse  .  Fractured pelvis    PT Plan of Care PT Home Exercise Plan: see scanned report PT Patient Instructions: discused HEP Consulted and Agree with Plan of Care: Patient;Family member/caregiver Family Member Consulted: wife Dewayne Hatch)  GP Functional Assessment Tool Used: ABC Functional Limitation: Mobility: Walking and moving around Mobility: Walking and Moving Around Current Status [phone]): At least 20 percent but less than 40 percent impaired, limited or restricted Mobility: Walking and Moving Around Goal Status [phone]): At least 1 percent but less than 20 percent impaired, limited or restricted  Marilyn Wing, PT 10/03/2012, 3:06 PM  Physician Documentation Your signature is required to indicate approval of the treatment plan as stated above.  Please sign and either send electronically or make a copy of this report for your files and return this physician signed original.   Please mark one 1.__approve of plan  2. ___approve of plan with the following conditions.   ______________________________                                                          _____________________ Physician Signature  Date  

## 2012-10-05 NOTE — Discharge Summary (Signed)
Physician Discharge Summary  Patient ID: Peter Lopez MRN: [id number] DOB/AGE: Sep 29, 1935 77 y.o. Primary Care Physician:Danielly Ackerley L, MD Admit date: 09/26/2012 Discharge date: 10/05/2012    Discharge Diagnoses:  Active Problems:   Hyperlipidemia   TOBACCO ABUSE   Sick sinus syndrome   Hypertension   Atrial fibrillation, new onset   Syncope   ETOH abuse   Fractured pelvis     Medication List    STOP taking these medications       propranolol 40 MG tablet  Commonly known as:  INDERAL      TAKE these medications       aspirin 81 MG tablet  Take 81 mg by mouth daily.     AZOR 5-40 MG per tablet  Generic drug:  amLODipine-olmesartan  Take 1 tablet by mouth daily.     folic acid 400 MCG tablet  Commonly known as:  FOLVITE  Take 400 mcg by mouth 2 (two) times daily.     methotrexate 2.5 MG tablet  Commonly known as:  RHEUMATREX  Take 1 mg by mouth once a week.     potassium chloride SA 20 MEQ tablet  Commonly known as:  K-DUR,KLOR-CON  Take 1 tablet (20 mEq total) by mouth daily.     primidone 50 MG tablet  Commonly known as:  MYSOLINE  Take 1 tablet (50 mg total) by mouth 2 (two) times daily.     simvastatin 40 MG tablet  Commonly known as:  ZOCOR  Take 40 mg by mouth daily.     traMADol 50 MG tablet  Commonly known as:  ULTRAM  Take 1 tablet (50 mg total) by mouth every 6 (six) hours as needed.        Discharged Condition:improved    Consults:cardiology/orthopedics  Significant Diagnostic Studies: Dg Hip Complete Right  09/26/2012  *RADIOLOGY REPORT*  Clinical Data: 77 year old male with recent fall.  Right hip and lower extremity pain.  RIGHT HIP - COMPLETE 2+ VIEW  Comparison: None.  Findings: Femoral heads are normally located.  Joint spaces are preserved.  Advanced calcified atherosclerosis continuing into the lower extremities.  Proximal right femur intact.  However, there is a minimally-displaced fracture of the right superior pubic ramus.  The pelvis elsewhere appears intact.  Lower lumbar disc and endplate degeneration.  IMPRESSION: 1.  Minimally-displaced fracture right superior pubic ramus. 2.  No other acute fracture identified about the right hip or pelvis. 3.  Advanced calcified atherosclerosis.   Original Report Authenticated By: Erskine Speed, M.D.    US Carotid Duplex Bilateral  09/26/2012  *RADIOLOGY REPORT*  Clinical Data: Stroke  BILATERAL CAROTID DUPLEX ULTRASOUND  Technique: Wallace Cullens scale imaging, color Doppler and duplex ultrasound was performed of bilateral carotid and vertebral arteries in the neck.  Comparison:  None.  Criteria:  Quantification of carotid stenosis is based on velocity parameters that correlate the residual internal carotid diameter with NASCET-based stenosis levels, using the diameter of the distal internal carotid lumen as the denominator for stenosis measurement.  The following velocity measurements were obtained:                   PEAK SYSTOLIC/END DIASTOLIC RIGHT ICA:                        94cm/sec CCA:                        89cm/sec SYSTOLIC ICA/CCA RATIO:  1.05 DIASTOLIC ICA/CCA RATIO: ECA:                        190cm/sec  LEFT ICA:                        70cm/sec CCA:                        100cm/sec SYSTOLIC ICA/CCA RATIO:     0.81 DIASTOLIC ICA/CCA RATIO: ECA:                        280cm/sec  Findings:  RIGHT CAROTID ARTERY: There is calcified scattered plaque in the common carotid there is also scattered plaque in the bulb.  Low resistance internal carotid Doppler pattern.  RIGHT VERTEBRAL ARTERY:  Antegrade.  LEFT CAROTID ARTERY: There is significant calcified plaque in the left bulb with scattered mild calcified plaque in the common carotid.  Low resistance internal carotid Doppler pattern.  LEFT VERTEBRAL ARTERY:  Antegrade.  IMPRESSION: Less than 50% stenosis in the right and left internal carotid arteries.  There is calcified plaque in the bilateral common and internal carotid arteries.   Significant narrowing at the origin of the external carotid arteries is suspected.   Original Report Authenticated By: Jolaine Click, M.D.    Mr Brain Ltd W/o Cm  09/26/2012  *RADIOLOGY REPORT*  Clinical Data: Syncope  MRI HEAD WITHOUT CONTRAST  Technique:  Multiplanar, multiecho pulse sequences of the brain and surrounding structures were obtained according to standard protocol without intravenous contrast.  Comparison: None.  Findings: Incomplete study.  The patient was not able to complete the study.  Sagittal T1 and axial diffusion and T2 imaging was performed.  Small hyperintensity left frontal cortex on diffusion weighted imaging is most likely acute infarct.  No other areas of acute infarct.  Age appropriate atrophy.  No hydrocephalus.  Brainstem and cerebellum are normal.  IMPRESSION: Incomplete study.  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   Original Report Authenticated By: [name], M.D.     Lab Results: Basic Metabolic Panel: No results found for this basename: NA, K, CL, CO2, GLUCOSE, BUN, CREATININE, CALCIUM, MG, PHOS,  in the last 72 hours Liver Function Tests: No results found for this basename: AST, ALT, ALKPHOS, BILITOT, PROT, ALBUMIN,  in the last 72 hours   CBC: No results found for this basename: WBC, NEUTROABS, HGB, HCT, MCV, PLT,  in the last 72 hours  No results found for this or any previous visit (from the past 240 hour(s)).   Hospital Course: he was admitted after a syncopal episode. He was found to be in atrial fibrillation which is new.there was concern that he had a stroke so he had neurology consultation and it was felt that he did not have a stroke. Cardiology consultation was obtained because of the new atrial fibrillation. Orthopedic consultation was obtained because of pelvic fracture. He was not a candidate for anti-coagulation. He had physical therapy and improved  Discharge Exam: Blood pressure 155/78, pulse 61, temperature 97.2 F  (36.2 C), temperature source Oral, resp. rate 20, height 5\' 9"  (1.753 m), weight 73.664 kg (162 lb 6.4 oz), SpO2 96.00%. He was awake and alert. His chest was clear. He was in atrial fibrillation. He had chronic tremor  Disposition: home to finish home physical therapy      Discharge Orders  Future Appointments Provider Department Dept Phone   10/07/2012 9:30 AM Amy B Brantley [facility] [phone]   10/09/2012 8:45 AM Amy Burnard B[facility] [phone]   10/14/2012 8:45 AM Anice Paganini [facility] [phone]   10/16/2012 8:45 AM Juel Burrow, PTA [facility] [phone]   10/21/2012 8:45 AM Amy Burnard Bunting [facility] [phone]   10/23/2012 8:45 AM Amy Burnard Bunting [facility] [phone]   10/28/2012 8:45 AM Juel Burrow, PTA [facility] [phone]   10/30/2012 8:45 AM Matilde Haymaker, PT [facility] [phone]   Future Orders Complete By Expires     Discharge patient  As directed        Follow-up Information   Schedule an appointment as soon as possible for a visit with Lorel Lembo L, MD. (Call for follow-up appointment within 1 week and as needed. )    Contact information:   [address] [place] [address] [phone]       Please follow up. (Outpatient Physical Therapy appointment - Thursday, February 13th at 0845. [facility])       Follow up with [place] Bing, MD. [facility] to contact you with follow-up appointment. Call if any questions/concerns. )    Contact information:   [address] [place] [address] [phone]       Signed: Fredirick Maudlin Pager [phone]  10/05/2012, 6:20 PM

## 2012-10-06 ENCOUNTER — Ambulatory Visit (HOSPITAL_COMMUNITY): Payer: Medicare Other | Admitting: Physical Therapy

## 2012-10-07 ENCOUNTER — Ambulatory Visit (HOSPITAL_COMMUNITY): Payer: Medicare Other | Admitting: Physical Therapy

## 2012-10-07 ENCOUNTER — Ambulatory Visit (HOSPITAL_COMMUNITY)
Admission: RE | Admit: 2012-10-07 | Discharge: 2012-10-07 | Disposition: A | Discharge status: Home / Self Care | Payer: Medicare Other | Source: Ambulatory Visit | Attending: Pulmonary Disease | Admitting: Pulmonary Disease

## 2012-10-07 NOTE — Progress Notes (Signed)
Physical Therapy Treatment Patient Details  Name: Peter Lopez MRN: [id number] Date of Birth: 1936-06-29  Today's Date: 10/07/2012 Time: 0930-1006 PT Time Calculation (min): 36 min Visit#: 2 of 8  Re-eval: 11/02/12 Authorization: Medicare  Authorization Visit#: 2 of 10  Charges:  therex 35'  Subjective: Symptoms/Limitations Symptoms: Pt. comes to therapy today with SPC, which he is mostly carrying.  States his pain is 5/10 today in his pelvis/hip area. Pain Assessment Currently in Pain?: Yes Pain Score:   5 Pain Location: Pelvis Pain Orientation: Right   Exercise/Treatments Standing Heel Raises: 20 reps Heel Raises Limitations: Toe Raises 20 reps Wall Squat: 10 reps;5 seconds Rocker Board: 2 minutes SLS: R: 5", L: 10" max of 3 Gait Training: No AD Other Standing Knee Exercises: B Hip Abd 15 reps; B hip extension 15 reps Supine Short Arc Quad Sets: Right;15 reps Heel Slides: Right;10 reps Bridges: 10 reps Straight Leg Raises: 15 reps Sidelying Hip ABduction: Limitations Hip ABduction Limitations: unable; too painful Clams: 10 reps AAROM Prone  Hamstring Curl: 15 reps Other Prone Exercises: TKE 10 reps    Physical Therapy Assessment and Plan PT Assessment and Plan Clinical Impression Statement: Pt. with noted fatigue with activity.  Added wall slides, rockerboard and SLS today.  Pt. unable to perform side lying hip abduction or prone hip extension due to weakness and pain.  Added side lying clams with holds to progress hip abductor strength.   No assistance needed with supine SLR today.  Pt will benefit from skilled therapeutic intervention in order to improve on the following deficits: Abnormal gait;Decreased activity tolerance;Decreased strength;Difficulty walking;Pain;Impaired perceived functional ability PT Frequency: Min 2X/week PT Duration: 4 weeks PT Treatment/Interventions: Gait training;Stair training;Therapeutic activities;Therapeutic exercise;Balance  training;Patient/family education;Manual techniques PT Plan: Add TM for activity tolerance and return to normal gait.  Progress balance activities.     Problem List Patient Active Problem List  Diagnosis  . ADENOCARCINOMA, PROSTATE  . Hyperlipidemia  . TOBACCO ABUSE  . TREMOR, ESSENTIAL  . Sick sinus syndrome  . Hypertension  . Atrial fibrillation, new onset  . Syncope  . ETOH abuse  . Fractured pelvis    PT - End of Session Activity Tolerance: Patient limited by fatigue;Patient limited by pain General Behavior During Session: [facility] for tasks performed Cognition: [facility] for tasks performed   Lurena Nida, PTA/CLT 10/07/2012, 10:15 AM

## 2012-10-09 ENCOUNTER — Ambulatory Visit (HOSPITAL_COMMUNITY): Payer: Medicare Other

## 2012-10-09 ENCOUNTER — Ambulatory Visit (HOSPITAL_COMMUNITY)
Admission: RE | Admit: 2012-10-09 | Discharge: 2012-10-09 | Disposition: A | Discharge status: Home / Self Care | Payer: Medicare Other | Source: Ambulatory Visit | Attending: Pulmonary Disease | Admitting: Pulmonary Disease

## 2012-10-09 NOTE — Progress Notes (Signed)
Physical Therapy Treatment Patient Details  Name: Peter Lopez MRN: [id number] Date of Birth: 07/17/1936  Today's Date: 10/09/2012 Time: 4098-1191 PT Time Calculation (min): 38 min  Visit#: 3 of 8  Authorization: Medicare  Authorization Visit#: 3 of 8  Charges:  therex 24', MHP X 1  Subjective: Symptoms/Limitations Symptoms: Pt .with noted increased antalgia and c/o increased pain in hip and R LB area, however still gives 5/10 pain rating.  Pt. reports he thinks he did too much around his house yesterday, increased walking. Pain Assessment Currently in Pain?: Yes Pain Score:   5 Pain Location: Pelvis Pain Orientation: Right   Exercise/Treatments Supine Heel Slides: Right;10 reps Bridges: 10 reps Straight Leg Raises: 15 reps Sidelying Clams: 10 reps AAROM Prone  Hamstring Curl: 15 reps Other Prone Exercises: TKE 15 reps     Physical Therapy Assessment and Plan PT Assessment and Plan Clinical Impression Statement: Held adding new activites and TM today due to increased pain, soreness and overall discomfort in R hip to knee and LB region.  Added MHP in supine with overall relief noted and improved ambulation at end of session.  Pt instructed to continue mat exercises and pace self with standing and ambulation activites.  Pt. pleased with pain reduction at end of session. PT Plan: Add TM and progress balance activites if pain is lower next visit.     Problem List Patient Active Problem List  Diagnosis  . ADENOCARCINOMA, PROSTATE  . Hyperlipidemia  . TOBACCO ABUSE  . TREMOR, ESSENTIAL  . Sick sinus syndrome  . Hypertension  . Atrial fibrillation, new onset  . Syncope  . ETOH abuse  . Fractured pelvis         Lurena Nida, PTA/CLT 10/09/2012, 10:11 AM

## 2012-10-14 ENCOUNTER — Ambulatory Visit (HOSPITAL_COMMUNITY)
Admission: RE | Admit: 2012-10-14 | Discharge: 2012-10-14 | Disposition: A | Discharge status: Home / Self Care | Payer: Medicare Other | Source: Ambulatory Visit | Attending: Pulmonary Disease | Admitting: Pulmonary Disease

## 2012-10-14 ENCOUNTER — Ambulatory Visit (HOSPITAL_COMMUNITY): Payer: Medicare Other

## 2012-10-14 NOTE — Progress Notes (Signed)
Physical Therapy Treatment Patient Details  Name: Peter Lopez MRN: [id number] Date of Birth: Aug 16, 1936  Today's Date: 10/14/2012 Time: 4098-1191 PT Time Calculation (min): 44 min  Visit#: 4 of    Re-eval: 11/02/12 Assessment Diagnosis: R stable pelvic fracture Surgical Date: 09/29/12 Next MD Visit: Dr. Juanetta Gosling 4 weeks; Dr. Romeo Apple - 4 weeks Charge: gait 8', therex 35'  Authorization: Medicare  Authorization Time Period:    Authorization Visit#: 4 of 8   Subjective: Symptoms/Limitations Symptoms: Pt entered dept carrying SPC, stated feeling better every day.  Pain scale however continues to be 4-5/10 today. Pain Assessment Currently in Pain?: Yes Pain Score:   5 Pain Location: Pelvis Pain Orientation: Right  Precautions/Restrictions  Precautions Precaution Comments: Hx of Cancer  Exercise/Treatments Aerobic Tread Mill: Gait training .50 cyc/sec with cueing to increase stride length and posture x 8 min Standing Functional Squat: 15 reps Other Standing Knee Exercises: B Hip Abd 15 reps; B hip extension 15 reps Other Standing Knee Exercises: Retro gait 2RT, side stepping with tband 2RT Supine Short Arc Quad Sets: Right;15 reps Bridges: 15 reps Straight Leg Raises: 15 reps Sidelying Clams: 10 reps AAROM Prone  Hamstring Curl: 15 reps Hip Extension: Limitations Hip Extension Limitations: unable  Other Prone Exercises: TKE 15 reps      Physical Therapy Assessment and Plan PT Assessment and Plan Clinical Impression Statement: Added gait training on TM to improve gait mechanics with cueing for posture and to increase stride length for more normalized gait mechanics.  Added retro gait and side stepping for gluteal strengthening.  Pt tolerated session well though limited by pain with activities.   PT Plan: Continue gait training, strengthening especailly glut musculature, progress balance activites as able.    Goals    Problem List Patient Active Problem List   Diagnosis  . ADENOCARCINOMA, PROSTATE  . Hyperlipidemia  . TOBACCO ABUSE  . TREMOR, ESSENTIAL  . Sick sinus syndrome  . Hypertension  . Atrial fibrillation, new onset  . Syncope  . ETOH abuse  . Fractured pelvis    PT - End of Session Equipment Utilized During Treatment: Gait belt Activity Tolerance: Patient limited by fatigue;Patient limited by pain General Behavior During Session: [facility] [place] [facility] for tasks performed Cognition: [facility] for tasks performed  GP    Juel Burrow 10/14/2012, 12:52 PM

## 2012-10-16 ENCOUNTER — Ambulatory Visit (HOSPITAL_COMMUNITY)
Admission: RE | Admit: 2012-10-16 | Discharge: 2012-10-16 | Disposition: A | Discharge status: Home / Self Care | Payer: Medicare Other | Source: Ambulatory Visit | Attending: Pulmonary Disease | Admitting: Pulmonary Disease

## 2012-10-16 ENCOUNTER — Ambulatory Visit (HOSPITAL_COMMUNITY): Payer: Medicare Other

## 2012-10-16 NOTE — Progress Notes (Signed)
Physical Therapy Treatment Patient Details  Name: Peter Lopez MRN: [id number] Date of Birth: 1936/04/08  Today's Date: 10/16/2012 Time: 4098-1191 PT Time Calculation (min): 42 min  Visit#: 5 of 8  Re-eval: 11/02/12  Charge: gait 8', therex 32'   Authorization: Medicare  Authorization Time Period:    Authorization Visit#: 5 of 8   Subjective: Symptoms/Limitations Symptoms: Pt with entered dept ambulating with no AD,stated feeling better every day.  Pain scale 3-4/10 R gluteal fold. Pain Assessment Currently in Pain?: Yes Pain Score:   4 Pain Location: Pelvis Pain Orientation: Right  Objective:   Exercise/Treatments Aerobic Tread Mill: 1.8 mph x 8 min Standing Functional Squat: 15 reps;Limitations Functional Squat Limitations: standing on BOSU SLS: R 3x 30" with 1 finger HHA Other Standing Knee Exercises: Weight shifting on BOSU S/S and F/B Other Standing Knee Exercises: Retro gait 2RT, side stepping with tband 2RT, tandem stance 4 in 3x 30" tandem gait 1RT, tandem gait on balance beam 2RT Supine Bridges: Limitations;15 reps Bridges Limitations: on green ball with h/s curl Sidelying Hip ABduction: AAROM;10 reps Clams: 10 reps AAROM Prone  Hip Extension: AAROM;5 reps Other Prone Exercises: TKE 15 reps      Physical Therapy Assessment and Plan PT Assessment and Plan Clinical Impression Statement: Pt with improved gait mechanics, able to increase cadence with min cueing for posture.  Progressed therex to dynamic surfaces for balance and glut strengthening.  Overall gluteal strength improving, pt able to complete prone SLR this session with AA.  Added theraball with bridges for gluteal and hamstring strengthening PT Plan: Continue gait training, strengthening especailly glut musculature, progress balance activites as able.    Goals    Problem List Patient Active Problem List  Diagnosis  . ADENOCARCINOMA, PROSTATE  . Hyperlipidemia  . TOBACCO ABUSE  .  TREMOR, ESSENTIAL  . Sick sinus syndrome  . Hypertension  . Atrial fibrillation, new onset  . Syncope  . ETOH abuse  . Fractured pelvis    PT - End of Session Equipment Utilized During Treatment: Gait belt Activity Tolerance: Patient tolerated treatment well General Behavior During Session: [facility] for tasks performed Cognition: [facility] for tasks performed  GP    Peter Lopez 10/16/2012, 10:07 AM

## 2012-10-20 ENCOUNTER — Other Ambulatory Visit: Payer: Self-pay | Admitting: Adult Health

## 2012-10-20 DIAGNOSIS — I4891 Unspecified atrial fibrillation: Secondary | ICD-10-CM

## 2012-10-21 ENCOUNTER — Ambulatory Visit (HOSPITAL_COMMUNITY): Payer: Medicare Other | Admitting: Physical Therapy

## 2012-10-21 ENCOUNTER — Ambulatory Visit (HOSPITAL_COMMUNITY)
Admission: RE | Admit: 2012-10-21 | Discharge: 2012-10-21 | Disposition: A | Discharge status: Home / Self Care | Payer: Medicare Other | Source: Ambulatory Visit | Attending: Pulmonary Disease | Admitting: Pulmonary Disease

## 2012-10-21 DIAGNOSIS — R262 Difficulty in walking, not elsewhere classified: Secondary | ICD-10-CM | POA: Insufficient documentation

## 2012-10-21 DIAGNOSIS — I10 Essential (primary) hypertension: Secondary | ICD-10-CM | POA: Insufficient documentation

## 2012-10-21 DIAGNOSIS — IMO0001 Reserved for inherently not codable concepts without codable children: Secondary | ICD-10-CM | POA: Insufficient documentation

## 2012-10-21 DIAGNOSIS — M25559 Pain in unspecified hip: Secondary | ICD-10-CM | POA: Insufficient documentation

## 2012-10-21 NOTE — Progress Notes (Signed)
Physical Therapy Treatment Patient Details  Name: Peter Lopez MRN: [id number] Date of Birth: 05-16-1936  Today's Date: 10/21/2012 Time: 4098-1191 PT Time Calculation (min): 39 min  Visit#: 6 of 8  Re-eval: 11/02/12 Authorization: Medicare  Authorization Time Period:    Authorization Visit#: 6 of 8  Charges:  therex 30', gait 8'  Subjective: Symptoms/Limitations Symptoms: Pt. states he is feeling good today.States he has more pain in his lumbar area than in his groin but states it is unrelated. Pain Assessment Currently in Pain?: Yes Pain Score:   3 Pain Location: Pelvis Pain Orientation: Right   Exercise/Treatments Aerobic Tread Mill: 1.8 mph x 8 min Standing Functional Squat: 15 reps;Limitations Functional Squat Limitations: standing on BOSU SLS: R: 8", L: 8" max of 5 trials no UE assist Other Standing Knee Exercises: perturbations on BOSU Other Standing Knee Exercises: Retro gait 2RT, side stepping with tband 2RT, tandem gait on balance beam 2RT Supine Bridges: Limitations;15 reps Bridges Limitations: on green ball with h/s curl Straight Leg Raises: 15 reps;Both Sidelying Hip ABduction: 10 reps;Right Clams: 10 reps AAROM Prone  Hamstring Curl: 15 reps;Limitations Hamstring Curl Limitations: bilateral Hip Extension: 10 reps Hip Extension Limitations: bilateral with knee slightly bent Other Prone Exercises: TKE 15 reps bilateral      Physical Therapy Assessment and Plan PT Assessment and Plan Clinical Impression Statement: Pt. with overall improving hip strength and stability.  BOSU continues to be a challenge for patient.  Pt. did not require AA to complete prone SLR and hip abduction this session.  Pain continues to decrease. PT Plan: Continue gait training, strengthening especailly glut musculature, progress balance activites as able.     Problem List Patient Active Problem List  Diagnosis  . ADENOCARCINOMA, PROSTATE  . Hyperlipidemia  . TOBACCO ABUSE   . TREMOR, ESSENTIAL  . Sick sinus syndrome  . Hypertension  . Atrial fibrillation, new onset  . Syncope  . ETOH abuse  . Fractured pelvis    PT - End of Session Activity Tolerance: Patient tolerated treatment well General Behavior During Session: [facility] for tasks performed Cognition: [facility] for tasks performed   Lurena Nida, PTA/CLT 10/21/2012, 9:29 AM

## 2012-10-23 ENCOUNTER — Ambulatory Visit (HOSPITAL_COMMUNITY)
Admission: RE | Admit: 2012-10-23 | Discharge: 2012-10-23 | Disposition: A | Discharge status: Home / Self Care | Payer: Medicare Other | Source: Ambulatory Visit | Attending: Pulmonary Disease | Admitting: Pulmonary Disease

## 2012-10-23 ENCOUNTER — Ambulatory Visit (HOSPITAL_COMMUNITY): Payer: Medicare Other | Admitting: Physical Therapy

## 2012-10-23 NOTE — Progress Notes (Signed)
Physical Therapy Treatment Patient Details  Name: MARCEL GARY MRN: [id number] Date of Birth: 26-Aug-1935  Today's Date: 10/23/2012 Time: 3086-5784 PT Time Calculation (min): 40 min  Visit#: 7 of 8  Re-eval: 11/02/12 Authorization: Medicare  Authorization Visit#: 7 of 8 Charges:  therex 30', gait 8'   Subjective: Symptoms/Limitations Symptoms: Pt. states his pain/discomfort is about the same. Pain Assessment Currently in Pain?: Yes Pain Score:   3 Pain Location: Pelvis Pain Orientation: Right   Exercise/Treatments Aerobic Tread Mill: 2.0 mph x 8 min Standing Functional Squat: 20 reps SLS: R: 8", L: 8" max of 5 trials no UE assist SLS with Vectors: 5X5" each with HHA Other Standing Knee Exercises: Side stepping with tband 2RT, fwd and retro tandem gait on balance beam 2RT Supine Bridges: Limitations;15 reps Bridges Limitations: on green ball with h/s curl Straight Leg Raises: 15 reps;Both Sidelying Hip ABduction: 10 reps;Right;AAROM Clams: 15 reps Prone  Hamstring Curl: 20 reps Hamstring Curl Limitations: bilateral Hip Extension: 10 reps Hip Extension Limitations: bilateral with knee slightly bent      Physical Therapy Assessment and Plan PT Assessment and Plan Clinical Impression Statement: Able to increase gait speed today and maintain gait quality.  Added SLS vector stance to increase stability.  Progressed to retro ambulation on balance beam with challenge and decreased step length.  SL hip abd and clams continue to cause pt. discomfort in pelvis area, requiring AAROM from therapist. PT Plan: Re-eval next visit prior to MD appt. in afternoon.     Problem List Patient Active Problem List  Diagnosis  . ADENOCARCINOMA, PROSTATE  . Hyperlipidemia  . TOBACCO ABUSE  . TREMOR, ESSENTIAL  . Sick sinus syndrome  . Hypertension  . Atrial fibrillation, new onset  . Syncope  . ETOH abuse  . Fractured pelvis    PT - End of Session Activity Tolerance: Patient  tolerated treatment well General Behavior During Session: [facility] for tasks performed Cognition: [facility] for tasks performed   Lurena Nida, PTA/CLT 10/23/2012, 9:48 AM

## 2012-10-27 ENCOUNTER — Ambulatory Visit (HOSPITAL_COMMUNITY)
Admission: RE | Admit: 2012-10-27 | Discharge: 2012-10-27 | Disposition: A | Discharge status: Home / Self Care | Payer: Medicare Other | Source: Ambulatory Visit | Attending: Pulmonary Disease | Admitting: Pulmonary Disease

## 2012-10-27 ENCOUNTER — Ambulatory Visit (INDEPENDENT_AMBULATORY_CARE_PROVIDER_SITE_OTHER): Payer: Medicare Other | Admitting: Orthopedic Surgery

## 2012-10-27 ENCOUNTER — Ambulatory Visit (INDEPENDENT_AMBULATORY_CARE_PROVIDER_SITE_OTHER): Payer: Medicare Other

## 2012-10-27 ENCOUNTER — Telehealth: Payer: Self-pay | Admitting: *Deleted

## 2012-10-27 VITALS — Ht 69.0 in | Wt 162.0 lb

## 2012-10-27 DIAGNOSIS — IMO0001 Reserved for inherently not codable concepts without codable children: Secondary | ICD-10-CM

## 2012-10-27 DIAGNOSIS — S329XXD Fracture of unspecified parts of lumbosacral spine and pelvis, subsequent encounter for fracture with routine healing: Secondary | ICD-10-CM

## 2012-10-27 NOTE — Progress Notes (Signed)
Patient ID: Peter Lopez, male   DOB: 03/12/1936, 77 y.o.   MRN: [id number] Hospital followup status post right pelvic fracture  Patient is indeterminate without assistive devices no limp  Clinical exam is normal.  X-rays show fracture healing  Recommend up as needed activities as tolerated

## 2012-10-27 NOTE — Telephone Encounter (Signed)
Pt accepted apt to see KL on 10-30-12 at 11:40am, MD SM advised verbally

## 2012-10-27 NOTE — Telephone Encounter (Signed)
Called pt to clarify if he is currently/recently experienced any sxs of concern per recent event monitor report due to be read, pt declines any concerns with Chest/Arm/jaw pain nor dizzyness or SOB noted, advised we will call him with the next steps if needed, pt understood

## 2012-10-27 NOTE — Telephone Encounter (Signed)
Patient of Dr. Dietrich Pates, seen by him in consultation recently after episode of syncope. Cardiac monitor show atrial fibrillation with episodic significant bradycardia - see report. Office visit should be scheduled this week with Ms. Lawrence for review. Nurse call to patient found no progressive symptoms, no recurrent syncope.

## 2012-10-27 NOTE — Evaluation (Signed)
Physical Therapy Discharge and Treatment  Patient Details  Name: Peter Lopez MRN: [id number] Date of Birth: 09-10-1935  Today's Date: 10/27/2012 Time: 4098-1191 PT Time Calculation (min): 38 min Charges: 1 MMT, 13' TE, 23' Self Care Visit#: 8 of 8  Re-eval: 11/02/12 Assessment Diagnosis: R stable pelvic fracture Surgical Date: 09/29/12 Next MD Visit: Dr. Juanetta Gosling 4 weeks; Dr. Romeo Apple - 4 weeks  Authorization: Medicare  Authorization Time Period:    Authorization Visit#: 8 of 8   Subjective Symptoms/Limitations Symptoms: Pt states that overall he is doing very well.  Limited some by discomfort to pelvis.   Precautions/Restrictions  Precautions Precaution Comments: Hx of Cancer  Cognition/Observation Observation/Other Assessments Observations: continues to have significant tremors   Sensation/Coordination/Flexibility/Functional Tests Functional Tests Functional Tests: ABC 91% (was 75%)  RLE Strength Right Hip Flexion: 4/5 (was 3-/5) Right Hip Extension: 4/5 (was 2/5) Right Hip ABduction: 3+/5 (limited by pain, was 2/5) Right Hip ADduction: 3+/5 (limited by pain, was 2+/5) Right Knee Flexion: 5/5 (was 5/5) Right Knee Extension:  (was 5/5)  Mobility/Balance  Ambulation/Gait Ambulation/Gait Assistance: 7: Independent Gait Pattern: Within Functional Limits Static Standing Balance Single Leg Stance - Right Leg: 0 (was 0) Single Leg Stance - Left Leg: 5 (was 0) Tandem Stance - Right Leg: 10 (was 0) Tandem Stance - Left Leg: 10 (was 0)   Exercise/Treatments Aerobic Tread Mill: 2.0 mph x 8 min Standing Wall Squat: 5 reps;5 seconds Other Standing Knee Exercises: Blue T-band: Side stepping  2 RT, hip abd x15, hip add x15, hip ext x15 Sidelying Hip ABduction: 10 reps Clams: 15 reps  Physical Therapy Assessment and Plan PT Assessment and Plan Clinical Impression Statement:  Mr. Rajan has attended 8 OP PT visits s/p R stable pelvic fractures with follwoing  findings: all goals met, ambulating independently for approriate amnout of time, limited by pain with hip add and hip abd activities causing continued weakness to muscles and is limited in balance due to increased resting tremors.  At this time all education complete and pt is independent with HEP.  Will continue to improve strength and pain continues to decrese.  At this time will D/C from PT w/advanced HEP. PT Plan: D/C    Goals Home Exercise Program Pt will Perform Home Exercise Program: Independently: Met PT Short Term Goals Time to Complete Short Term Goals: 2 weeks PT Short Term Goal 1: Pt will improve activity tolerance and ambulate independently for 15 minutes in indoor environment for improved independence (able to go in community comfortably. ) PT Short Term Goal 1 - Progress: Met PT Short Term Goal 2: Pt will improve LE strength by 1 muscle grade and ascend and descend 10 staris w/step to pattern for easy entry to basketball games.  PT Short Term Goal 2 - Progress: Met PT Short Term Goal 3: Pt will improve dynamic balance and begin independent outdoor ambulation.  PT Short Term Goal 3 - Progress: Met PT Short Term Goal 4: Pt will report pain less than 3/10 for 75% of his day.  PT Long Term Goals Time to Complete Long Term Goals: 4 weeks PT Long Term Goal 1: Pt will improve his ABC to greater than 80% for improved QOL. Met PT Long Term Goal 2: Pt will improve LE strength to [facility] in order to ascend and descend 15 stairs without handrails w/reciprocal pattern for greater ease to watch sporting events.  PT Long Term Goal 2 - Progress: Met Long Term Goal 3: Pt will improve his  dynamic balance and ambulate independently on outdoor surfaces.  Long Term Goal 3 Progress: Met  Problem List Patient Active Problem List  Diagnosis  . ADENOCARCINOMA, PROSTATE  . Hyperlipidemia  . TOBACCO ABUSE  . TREMOR, ESSENTIAL  . Sick sinus syndrome  . Hypertension  . Atrial fibrillation, new onset   . Syncope  . ETOH abuse  . Fractured pelvis   PT - End of Session Activity Tolerance: Patient tolerated treatment well General Behavior During Session: [facility] for tasks performed Cognition: [facility] for tasks performed PT Plan of Care PT Home Exercise Plan: updated with advanced HEP, provided blue tband.  PT Patient Instructions: Answered remaining questions about strength and progression and discussed ABC. Consulted and Agree with Plan of Care: Patient  Annett Fabian, PT 10/27/2012, 9:28 AM  Physician Documentation Your signature is required to indicate approval of the treatment plan as stated above.  Please sign and either send electronically or make a copy of this report for your files and return this physician signed original.   Please mark one 1.__approve of plan  2. ___approve of plan with the following conditions.   ______________________________                                                          _____________________ Physician Signature                                                                                                             Date

## 2012-10-28 ENCOUNTER — Ambulatory Visit (HOSPITAL_COMMUNITY): Payer: Medicare Other | Admitting: Physical Therapy

## 2012-10-28 ENCOUNTER — Ambulatory Visit (HOSPITAL_COMMUNITY): Payer: Medicare Other

## 2012-10-28 ENCOUNTER — Encounter: Payer: Self-pay | Admitting: Adult Health

## 2012-10-28 NOTE — Progress Notes (Signed)
HPI: [name] is a 77 y/o patient of Dr.Rothbart we are following for ongoing assessment and management of   cardiovascular risk factors and a history of disturbed cardiac conduction, but no known vascular disease. He wore a cardiac monitor that was found to be abnormal with evidence of atrial fibrillation and bradycardia with evidence of a 2 point 3 second pauses along with ventricular arrhythmias.Marland Kitchen He is here to discuss those results. The patient has been asymptomatic for fatigue shortness of breath chest pain or dizziness. He had multiple medication changes in the setting of hypertension and has been taken off propanolol. He is not on any AV blocking agents. He admits to sleeping a lot, often taking naps in both the morning and in the afternoon. He believes this is related to his age.   No Known Allergies  Current Outpatient Prescriptions  Medication Sig Dispense Refill  . amLODipine-olmesartan (AZOR) 5-40 MG per tablet Take 1 tablet by mouth daily.      Marland Kitchen aspirin 81 MG tablet Take 81 mg by mouth daily.        . folic acid (FOLVITE) 400 MCG tablet Take 400 mcg by mouth 2 (two) times daily.        . methotrexate (RHEUMATREX) 2.5 MG tablet Take 1 mg by mouth once a week.      . potassium chloride SA (K-DUR,KLOR-CON) 20 MEQ tablet Take 1 tablet (20 mEq total) by mouth daily.  10 tablet  0  . primidone (MYSOLINE) 50 MG tablet Take 1 tablet (50 mg total) by mouth 2 (two) times daily.  60 tablet  5  . simvastatin (ZOCOR) 40 MG tablet Take 40 mg by mouth daily.        . traMADol (ULTRAM) 50 MG tablet Take 1 tablet (50 mg total) by mouth every 6 (six) hours as needed.  30 tablet  2   No current facility-administered medications for this visit.    Past Medical History  Diagnosis Date  . Exertional dyspnea   . 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 Hypertension   . Hyperlipidemia   . Benign essential tremor   . Tobacco abuse     30 pack years; discontinued in 2000; currently one pack per  week  . Prostate cancer     S/P Prostatectomy    Past Surgical History  Procedure Laterality Date  . Meniscectomy      Right knee open meniscectomy  . Knee surgery      Left laparoscopic knee surgery  . Robot assisted laparoscopic radical prostatectomy    . Appendectomy    . Colonoscopy  2011  . Hernia repair      RUE:AVWUJW of systems complete and found to be negative unless listed above  PHYSICAL EXAM There were no vitals taken for this visit. General: Well developed, well nourished, in no acute distress Head: Eyes PERRLA, No xanthomas.   Normal cephalic and atramatic  Lungs: Clear bilaterally to auscultation and percussion. Heart: HRIR S1 S2, bradycardic without MRG.  Pulses are 2+ & equal.            No carotid bruit. No JVD.  No abdominal bruits. No femoral bruits. Abdomen: Bowel sounds are positive, abdomen soft and non-tender without masses or                  Hernia's noted. Msk:  Back normal, normal gait. Normal strength and tone for age. Extremities: No clubbing, cyanosis or edema.  DP +1 Neuro: Alert and oriented X 3. Psych:  Good affect, responds appropriately  Cardionet Monitor 09/22/2012 atrial fibrillation with slow ventricular response, bradycardia with rates to 37 beats per minute, 2.37, with recurrent ventricular arrhythmias  ASSESSMENT AND PLAN

## 2012-10-30 ENCOUNTER — Encounter: Payer: Self-pay | Admitting: Adult Health

## 2012-10-30 ENCOUNTER — Ambulatory Visit (HOSPITAL_COMMUNITY): Payer: Medicare Other | Admitting: Physical Therapy

## 2012-10-30 ENCOUNTER — Ambulatory Visit (INDEPENDENT_AMBULATORY_CARE_PROVIDER_SITE_OTHER): Payer: Medicare Other | Admitting: Adult Health

## 2012-10-30 VITALS — BP 154/64 | HR 60 | Ht 69.0 in | Wt 162.0 lb

## 2012-10-30 DIAGNOSIS — F1010 Alcohol abuse, uncomplicated: Secondary | ICD-10-CM

## 2012-10-30 NOTE — Patient Instructions (Addendum)
Make appointment with Dr.Taylor to discuss possible pacemaker

## 2012-10-30 NOTE — Assessment & Plan Note (Signed)
He is mildly hypertensive on today's visit. He states he normally runs around 100 and 135  mmHg systolic at home. We will continue him on current medication regimens as directed, with adjustments after discussion with Dr. Ladona Ridgel on his evaluation.

## 2012-10-30 NOTE — Assessment & Plan Note (Addendum)
The patient diminshes is the amount of alcohol he drinks daily.

## 2012-10-30 NOTE — Assessment & Plan Note (Signed)
He has had no further complaints of syncope. He has had medication adjustments in his antihypertensive medications to include discontinuation of propanolol. The patient will continue on current medication regimen without changes at this time.

## 2012-10-30 NOTE — Assessment & Plan Note (Signed)
I have reviewed at length with the patient and his wife the CardioNet monitor results which have been read by Dr. Simona Huh. I spent approximately 40 minutes with this patient and his wife answering several questions, discussing atrial fibrillation, showing him his monitor tracings, with exclamations concerning his pauses and ventricular arrhythmias. The patient states he is asymptomatic but does admit to sleeping a lot. We will refer him to Dr. Ladona Ridgel electrophysiologist with appointment to be made in [place] as this is the soonest appointment. The patient wishes to discuss need for pacemaker with Dr. Ladona Ridgel, and other options that are available to him.

## 2012-11-11 ENCOUNTER — Ambulatory Visit (INDEPENDENT_AMBULATORY_CARE_PROVIDER_SITE_OTHER): Payer: Medicare Other | Admitting: Internal Medicine

## 2012-11-11 ENCOUNTER — Encounter: Payer: Self-pay | Admitting: *Deleted

## 2012-11-11 ENCOUNTER — Encounter: Payer: Self-pay | Admitting: Internal Medicine

## 2012-11-11 VITALS — BP 156/74 | HR 73 | Ht 69.0 in | Wt 162.8 lb

## 2012-11-11 DIAGNOSIS — I4891 Unspecified atrial fibrillation: Secondary | ICD-10-CM

## 2012-11-11 DIAGNOSIS — R55 Syncope and collapse: Secondary | ICD-10-CM

## 2012-11-11 LAB — BASIC METABOLIC PANEL
Calcium: 9.6 mg/dL (ref 8.4–10.5)
GFR: 76.52 mL/min (ref >60.00)
Glucose, Bld: 98 mg/dL (ref 70–99)
Potassium: 4.6 mEq/L (ref 3.5–5.1)
Sodium: 138 mEq/L (ref 135–145)

## 2012-11-11 LAB — CBC WITH DIFFERENTIAL/PLATELET
Basophils Absolute: 0 10*3/uL (ref 0.0–0.1)
Eosinophils Relative: 1.3 % (ref 0.0–5.0)
Hemoglobin: 15.7 g/dL (ref 13.0–17.0)
Lymphocytes Relative: 27.6 % (ref 12.0–46.0)
Monocytes Relative: 11.4 % (ref 3.0–12.0)
Neutro Abs: 5.7 10*3/uL (ref 1.4–7.7)
Platelets: 241 10*3/uL (ref 150.0–400.0)
RDW: 15.2 % — ABNORMAL HIGH (ref 11.5–14.6)
WBC: 9.6 10*3/uL (ref 4.5–10.5)

## 2012-11-11 NOTE — Assessment & Plan Note (Signed)
The patient has syncope, atrial fibrillation, and documented pauses of over 3 seconds on cardiac monitoring. All this occurred in the absence of any AV nodal blocking drugs. I've discussed the treatment options with the patient. He would be a good candidate for insertion of our lead was pacemaker. This be scheduled in the next couple of weeks.

## 2012-11-11 NOTE — Progress Notes (Signed)
HPI Peter Lopez is referred today by Dr. Rothbart for evaluation of atrial fibrillation, bradycardia, and syncope. The patient is a very pleasant 77-year-old man with a history of dyslipidemia, hypertension, and atrial fibrillation. After a syncopal episode several weeks ago, the patient worked cardiac monitor which demonstrated chronic atrial fibrillation and pauses of over 3 seconds. He experienced a syncopal episode while he was in [place], and injured himself. It appears that he signed out against medical device during that hospitalization. He has had no recurrent syncope since. The patient has not been on anticoagulation other than aspirin. No Known Allergies   Current Outpatient Prescriptions  Medication Sig Dispense Refill  . amLODipine-olmesartan (AZOR) 5-40 MG per tablet Take 1 tablet by mouth daily.      . aspirin 81 MG tablet Take 81 mg by mouth daily.        . folic acid (FOLVITE) 400 MCG tablet Take 400 mcg by mouth daily.       . methotrexate (RHEUMATREX) 2.5 MG tablet Take 5 mg by mouth once a week.       . primidone (MYSOLINE) 50 MG tablet Take 1 tablet (50 mg total) by mouth 2 (two) times daily.  60 tablet  5  . simvastatin (ZOCOR) 40 MG tablet Take 40 mg by mouth daily.         No current facility-administered medications for this visit.     Past Medical History  Diagnosis Date  . Exertional dyspnea   . 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 Hypertension   . Hyperlipidemia   . Benign essential tremor   . Tobacco abuse     30 pack years; discontinued in 2000; currently one pack per week  . Prostate cancer     S/P Prostatectomy    ROS:   All systems reviewed and negative except as noted in the HPI.   Past Surgical History  Procedure Laterality Date  . Meniscectomy      Right knee open meniscectomy  . Knee surgery      Left laparoscopic knee surgery  . Robot assisted laparoscopic radical prostatectomy    . Appendectomy    . Colonoscopy  2011  . Hernia  repair       Family History  Problem Relation Age of Onset  . Alzheimer's disease Mother 72  . Early death Father 39    trauma  . Heart failure Brother      History   Social History  . Marital Status: Married    Spouse Name: N/A    Number of Children: N/A  . Years of Education: N/A   Occupational History  . scrap metal dealer    Social History Main Topics  . Smoking status: Current Every Day Smoker -- 1.00 packs/day    Types: Cigarettes    Last Attempt to Quit: 12/11/1998  . Smokeless tobacco: Never Used  . Alcohol Use: Yes     Comment: modest  . Drug Use: No  . Sexually Active: Not on file   Other Topics Concern  . Not on file   Social History Narrative  . No narrative on file     BP 156/74  Pulse 73  Ht 5' 9" (1.753 m)  Wt 162 lb 12.8 oz (73.846 kg)  BMI 24.03 kg/m2  Physical Exam:  Well appearing 77-year-old man,NAD HEENT: Unremarkable Neck:  7 cm JVD, no thyromegally Lungs:  Clear with no wheezes, rales, or rhonchi. HEART:  IRegular bradycardia rhythm,   no murmurs, no rubs, no clicks Abd:  soft, positive bowel sounds, no organomegally, no rebound, no guarding Ext:  2 plus pulses, no edema, no cyanosis, no clubbing Skin:  No rashes no nodules Neuro:  CN II through XII intact, motor grossly intact  EKG Atrial fibrillation with a slow ventricular response  Assess/Plan: 

## 2012-11-11 NOTE — Assessment & Plan Note (Signed)
The duration of his atrial fibrillation is unclear. 3 years ago, he was in normal sinus rhythm with marked first degree AV block. He is not on systemic anticoagulation, and my expectation is that he will be started on this medication after his pacemaker has been placed.

## 2012-11-11 NOTE — Patient Instructions (Signed)

## 2012-11-12 ENCOUNTER — Encounter (HOSPITAL_COMMUNITY): Payer: Self-pay

## 2012-11-12 ENCOUNTER — Other Ambulatory Visit: Payer: Self-pay | Admitting: *Deleted

## 2012-11-12 DIAGNOSIS — I4891 Unspecified atrial fibrillation: Secondary | ICD-10-CM

## 2012-11-12 DIAGNOSIS — I495 Sick sinus syndrome: Secondary | ICD-10-CM

## 2012-11-14 ENCOUNTER — Telehealth: Payer: Self-pay | Admitting: *Deleted

## 2012-11-14 NOTE — Telephone Encounter (Signed)
No change in time °

## 2012-11-14 NOTE — Telephone Encounter (Signed)
I spoke to patient about not meeting the inclusion/exclusion for the Leadless II Study. I explained to patient that he has RV systolic pressure that exceeds criteria for the study. I explained to patient he will still have regular pacemaker inserted. The chest prep will need to be done. Patient was disappointed. He wanted me to confirm that the time would still be the same for the implant on 11/24/12. I told him I would confirm with Dr. Ladona Ridgel and let him know.

## 2012-11-21 ENCOUNTER — Telehealth: Payer: Self-pay | Admitting: Internal Medicine

## 2012-11-21 NOTE — Telephone Encounter (Signed)
Spoke with patient and have answered his questions as best I could for his up coming procedure

## 2012-11-21 NOTE — Telephone Encounter (Signed)
Follow up   Pt need to talk to someone about his sx for Monday. He doesn't know what he is suppose to be doing because no one has given him any instructions. Please call pt concerning this matter.

## 2012-11-21 NOTE — Telephone Encounter (Signed)
New problem   Pt has questions before going into surgery Call cell after 2p [id number]

## 2012-11-23 MED ORDER — SODIUM CHLORIDE 0.9 % IR SOLN
80.0000 mg | Status: DC
  Filled 2012-11-23: qty 2

## 2012-11-23 MED ORDER — CEFAZOLIN SODIUM-DEXTROSE 2-3 GM-% IV SOLR
2.0000 g | INTRAVENOUS | Status: DC
  Filled 2012-11-23 (×2): qty 50

## 2012-11-24 ENCOUNTER — Ambulatory Visit (HOSPITAL_COMMUNITY)
Admission: RE | Admit: 2012-11-24 | Discharge: 2012-11-25 | Disposition: A | Discharge status: Home / Self Care | Payer: Medicare Other | Source: Ambulatory Visit | Attending: Internal Medicine | Admitting: Internal Medicine

## 2012-11-24 ENCOUNTER — Encounter (HOSPITAL_COMMUNITY): Payer: Self-pay | Admitting: General Practice

## 2012-11-24 ENCOUNTER — Encounter (HOSPITAL_COMMUNITY)
Admission: RE | Disposition: A | Discharge status: Home / Self Care | Payer: Self-pay | Source: Ambulatory Visit | Attending: Internal Medicine

## 2012-11-24 DIAGNOSIS — F17200 Nicotine dependence, unspecified, uncomplicated: Secondary | ICD-10-CM

## 2012-11-24 DIAGNOSIS — F1010 Alcohol abuse, uncomplicated: Secondary | ICD-10-CM

## 2012-11-24 DIAGNOSIS — R55 Syncope and collapse: Secondary | ICD-10-CM

## 2012-11-24 DIAGNOSIS — Z950 Presence of cardiac pacemaker: Secondary | ICD-10-CM

## 2012-11-24 DIAGNOSIS — I4891 Unspecified atrial fibrillation: Secondary | ICD-10-CM | POA: Insufficient documentation

## 2012-11-24 DIAGNOSIS — I498 Other specified cardiac arrhythmias: Secondary | ICD-10-CM

## 2012-11-24 DIAGNOSIS — I459 Conduction disorder, unspecified: Secondary | ICD-10-CM | POA: Insufficient documentation

## 2012-11-24 DIAGNOSIS — G250 Essential tremor: Secondary | ICD-10-CM

## 2012-11-24 DIAGNOSIS — E785 Hyperlipidemia, unspecified: Secondary | ICD-10-CM

## 2012-11-24 DIAGNOSIS — I495 Sick sinus syndrome: Secondary | ICD-10-CM

## 2012-11-24 DIAGNOSIS — C61 Malignant neoplasm of prostate: Secondary | ICD-10-CM

## 2012-11-24 DIAGNOSIS — I10 Essential (primary) hypertension: Secondary | ICD-10-CM | POA: Insufficient documentation

## 2012-11-24 HISTORY — PX: PERMANENT PACEMAKER INSERTION: SHX5480

## 2012-11-24 HISTORY — PX: INSERT / REPLACE / REMOVE PACEMAKER: SUR710

## 2012-11-24 HISTORY — DX: Presence of cardiac pacemaker: Z95.0

## 2012-11-24 LAB — CREATININE, SERUM: Creatinine, Ser: 0.64 mg/dL (ref 0.50–1.35)

## 2012-11-24 LAB — CBC
MCH: 31.3 pg (ref 26.0–34.0)
MCHC: 35.1 g/dL (ref 30.0–36.0)
Platelets: 190 10*3/uL (ref 150–400)

## 2012-11-24 LAB — SURGICAL PCR SCREEN
MRSA, PCR: NEGATIVE
Staphylococcus aureus: NEGATIVE

## 2012-11-24 SURGERY — PERMANENT PACEMAKER INSERTION
Anesthesia: LOCAL

## 2012-11-24 MED ORDER — HEPARIN SODIUM (PORCINE) 5000 UNIT/ML IJ SOLN
5000.0000 [IU] | Freq: Three times a day (TID) | INTRAMUSCULAR | Status: DC
  Administered 2012-11-24 – 2012-11-25 (×2): 5000 [IU] via SUBCUTANEOUS
  Filled 2012-11-24 (×6): qty 1

## 2012-11-24 MED ORDER — PRIMIDONE 50 MG PO TABS
50.0000 mg | ORAL_TABLET | Freq: Two times a day (BID) | ORAL | Status: DC
  Administered 2012-11-24: 50 mg via ORAL
  Filled 2012-11-24 (×3): qty 1

## 2012-11-24 MED ORDER — SODIUM CHLORIDE 0.9 % IV SOLN
INTRAVENOUS | Status: DC
  Administered 2012-11-24: 1000 mL via INTRAVENOUS

## 2012-11-24 MED ORDER — MUPIROCIN 2 % EX OINT
TOPICAL_OINTMENT | Freq: Two times a day (BID) | CUTANEOUS | Status: DC
  Administered 2012-11-24: 06:00:00 via NASAL
  Filled 2012-11-24: qty 22

## 2012-11-24 MED ORDER — IRBESARTAN 300 MG PO TABS
300.0000 mg | ORAL_TABLET | Freq: Every day | ORAL | Status: DC
  Filled 2012-11-24: qty 1

## 2012-11-24 MED ORDER — CHLORHEXIDINE GLUCONATE 4 % EX LIQD: 60.0000 mL | Freq: Once | CUTANEOUS | Status: DC

## 2012-11-24 MED ORDER — AMLODIPINE-OLMESARTAN 5-40 MG PO TABS: 1.0000 | ORAL_TABLET | Freq: Every day | ORAL | Status: DC

## 2012-11-24 MED ORDER — MIDAZOLAM HCL 5 MG/5ML IJ SOLN
INTRAMUSCULAR | Status: AC
  Filled 2012-11-24: qty 5

## 2012-11-24 MED ORDER — LIDOCAINE HCL (PF) 1 % IJ SOLN
INTRAMUSCULAR | Status: AC
  Filled 2012-11-24: qty 60

## 2012-11-24 MED ORDER — ACETAMINOPHEN 325 MG PO TABS
325.0000 mg | ORAL_TABLET | ORAL | Status: DC | PRN
  Administered 2012-11-24: 650 mg via ORAL
  Filled 2012-11-24: qty 2

## 2012-11-24 MED ORDER — ASPIRIN EC 81 MG PO TBEC
81.0000 mg | DELAYED_RELEASE_TABLET | Freq: Every day | ORAL | Status: DC
  Filled 2012-11-24 (×2): qty 1

## 2012-11-24 MED ORDER — ATORVASTATIN CALCIUM 20 MG PO TABS
20.0000 mg | ORAL_TABLET | Freq: Every day | ORAL | Status: DC
  Filled 2012-11-24: qty 1

## 2012-11-24 MED ORDER — HEPARIN (PORCINE) IN NACL 2-0.9 UNIT/ML-% IJ SOLN
INTRAMUSCULAR | Status: AC
  Filled 2012-11-24: qty 500

## 2012-11-24 MED ORDER — FENTANYL CITRATE 0.05 MG/ML IJ SOLN
INTRAMUSCULAR | Status: AC
  Filled 2012-11-24: qty 2

## 2012-11-24 MED ORDER — CEFAZOLIN SODIUM-DEXTROSE 2-3 GM-% IV SOLR
2.0000 g | Freq: Four times a day (QID) | INTRAVENOUS | Status: AC
  Administered 2012-11-24 – 2012-11-25 (×3): 2 g via INTRAVENOUS
  Filled 2012-11-24 (×3): qty 50

## 2012-11-24 MED ORDER — ONDANSETRON HCL 4 MG/2ML IJ SOLN: 4.0000 mg | Freq: Four times a day (QID) | INTRAMUSCULAR | Status: DC | PRN

## 2012-11-24 MED ORDER — SIMVASTATIN 40 MG PO TABS: 40.0000 mg | ORAL_TABLET | Freq: Every day | ORAL | Status: DC

## 2012-11-24 MED ORDER — ASPIRIN 81 MG PO TABS: 81.0000 mg | ORAL_TABLET | Freq: Every day | ORAL | Status: DC

## 2012-11-24 MED ORDER — AMLODIPINE BESYLATE 5 MG PO TABS
5.0000 mg | ORAL_TABLET | Freq: Every day | ORAL | Status: DC
  Filled 2012-11-24: qty 1

## 2012-11-24 NOTE — Op Note (Signed)
Electrophysiology procedure note  Procedure: Pacemaker insertion with left upper extremity contrast venography Indication: Symptomatic bradycardia Complications: None immediately Findings: After informed consent was obtained, the patient was taken to the diagnostic electrophysiology laboratory in the fasting state. After the usual preparation and draping, intravenous Versed and fentanyl was utilized for sedation. 30 cc of lidocaine was infiltrated in the left infraclavicular region. A 5 cm incision was carried out. Electrocautery was utilized to dissect down to the fascial plane. Initially, the subclavian vein could not be cannulated. 10 cc of IV contrast was then injected into the left upper extremity venous system, which demonstrated that the subclavian vein was shifted caudally. The vein was then punctured and a Medtronic model 5076, 58 cm, active fixation pacing lead, serial number [id number] in [id number] was advanced into the right ventricle. Mapping was carried out and the lead was actively fixed. The R waves measured 9. The pacing impedance was 800 ohms. The pacing threshold was less than 1 V at 0.5 ms pulse width. 10 V pacing did not stimulate the diaphragm. There was a large injury current with active fixation of the lead. At this point, the lead was connected to the Medtronic single-chamber pacemaker, serial number [id number]. the device was secured to the fascial plane with silk suture. Antibiotic irrigation was utilized to irrigate the pocket. The incision was closed with 2-0 and 3-0 Vicryl. Benzoin and Steri-Strips were placed on the skin, and the patient was returned to his room in satisfactory condition.  Lewayne Bunting, M.D.

## 2012-11-24 NOTE — Interval H&P Note (Signed)
History and Physical Interval Note:  11/24/2012 8:45 AM  Peter Lopez  has presented today for surgery, with the diagnosis of Heart block  The various methods of treatment have been discussed with the patient and family. After consideration of risks, benefits and other options for treatment, the patient has consented to  Procedure(s): PERMANENT PACEMAKER INSERTION (N/A) as a surgical intervention .  The patient's history has been reviewed, patient examined, no change in status, stable for surgery.  I have reviewed the patient's chart and labs.  Questions were answered to the patient's satisfaction.     Lewayne Bunting

## 2012-11-24 NOTE — H&P (View-Only) (Signed)
HPI Peter Lopez is referred today by Dr. Dietrich Pates for evaluation of atrial fibrillation, bradycardia, and syncope. The patient is a very pleasant 77 year old man with a history of dyslipidemia, hypertension, and atrial fibrillation. After a syncopal episode several weeks ago, the patient worked cardiac monitor which demonstrated chronic atrial fibrillation and pauses of over 3 seconds. He experienced a syncopal episode while he was in [place], and injured himself. It appears that he signed out against medical device during that hospitalization. He has had no recurrent syncope since. The patient has not been on anticoagulation other than aspirin. No Known Allergies   Current Outpatient Prescriptions  Medication Sig Dispense Refill  . amLODipine-olmesartan (AZOR) 5-40 MG per tablet Take 1 tablet by mouth daily.      Marland Kitchen aspirin 81 MG tablet Take 81 mg by mouth daily.        . folic acid (FOLVITE) 400 MCG tablet Take 400 mcg by mouth daily.       . methotrexate (RHEUMATREX) 2.5 MG tablet Take 5 mg by mouth once a week.       . primidone (MYSOLINE) 50 MG tablet Take 1 tablet (50 mg total) by mouth 2 (two) times daily.  60 tablet  5  . simvastatin (ZOCOR) 40 MG tablet Take 40 mg by mouth daily.         No current facility-administered medications for this visit.     Past Medical History  Diagnosis Date  . Exertional dyspnea   . 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 Hypertension   . Hyperlipidemia   . Benign essential tremor   . Tobacco abuse     30 pack years; discontinued in 2000; currently one pack per week  . Prostate cancer     S/P Prostatectomy    ROS:   All systems reviewed and negative except as noted in the HPI.   Past Surgical History  Procedure Laterality Date  . Meniscectomy      Right knee open meniscectomy  . Knee surgery      Left laparoscopic knee surgery  . Robot assisted laparoscopic radical prostatectomy    . Appendectomy    . Colonoscopy  2011  . Hernia  repair       Family History  Problem Relation Age of Onset  . Alzheimer's disease Mother 79  . Early death Father 19    trauma  . Heart failure Brother      History   Social History  . Marital Status: Married    Spouse Name: N/A    Number of Children: N/A  . Years of Education: N/A   Occupational History  . Company secretary    Social History Main Topics  . Smoking status: Current Every Day Smoker -- 1.00 packs/day    Types: Cigarettes    Last Attempt to Quit: 12/11/1998  . Smokeless tobacco: Never Used  . Alcohol Use: Yes     Comment: modest  . Drug Use: No  . Sexually Active: Not on file   Other Topics Concern  . Not on file   Social History Narrative  . No narrative on file     BP 156/74  Pulse 73  Ht 5\' 9"  (1.753 m)  Wt 162 lb 12.8 oz (73.846 kg)  BMI 24.03 kg/m2  Physical Exam:  Well appearing 77 year old man,NAD HEENT: Unremarkable Neck:  7 cm JVD, no thyromegally Lungs:  Clear with no wheezes, rales, or rhonchi. HEART:  IRegular bradycardia rhythm,  no murmurs, no rubs, no clicks Abd:  soft, positive bowel sounds, no organomegally, no rebound, no guarding Ext:  2 plus pulses, no edema, no cyanosis, no clubbing Skin:  No rashes no nodules Neuro:  CN II through XII intact, motor grossly intact  EKG Atrial fibrillation with a slow ventricular response  Assess/Plan:

## 2012-11-24 NOTE — Progress Notes (Signed)
Clinically equivalent dose of Lipitor has been substituted for Zocor per [facility] P&T committee policy.  Formulary substitution of Atorvastatin 20 daily for Simvastatin 40mg  daily  in patients also receiving amlodipine.  The dose of simvastatin should not exceed 20 mg/day in patients receiving amlodipine.  Consider this substituition upon discharge if patient remains on both amlodipine and statin therapy.   Noah Delaine, RPh Clinical Pharmacist Pager: [phone] 11/24/2012 17: 22PM

## 2012-11-25 ENCOUNTER — Telehealth: Payer: Self-pay | Admitting: *Deleted

## 2012-11-25 ENCOUNTER — Ambulatory Visit (HOSPITAL_COMMUNITY): Payer: Medicare Other

## 2012-11-25 MED ORDER — RIVAROXABAN 20 MG PO TABS: 20.0000 mg | ORAL_TABLET | Freq: Every day | ORAL | Status: DC

## 2012-11-25 NOTE — Telephone Encounter (Signed)
Walk in for samples of xarelto, samples provided.

## 2012-11-25 NOTE — Progress Notes (Signed)
     Patient: Peter Lopez Date of Encounter: 11/25/2012, 7:39 AM Admit date: 11/24/2012     Subjective  Mr. Attwood reports some incisional soreness. He denies CP, SOB or palpitations.    Objective  Physical Exam: Vitals: BP 120/66  Pulse 58  Temp(Src) 97.5 F (36.4 C) (Oral)  Resp 18  Ht 5\' 9"  (1.753 m)  Wt 162 lb (73.483 kg)  BMI 23.91 kg/m2  SpO2 97% General: Well developed, well appearing 77 year old male in no acute distress. Neck: Supple. JVD not elevated. Lungs: Clear bilaterally to auscultation without wheezes, rales, or rhonchi. Breathing is unlabored. Heart: Irregular S1 S2 without murmur, rub or gallop.  Abdomen: Soft, non-distended. Extremities: No clubbing or cyanosis. No edema.  Distal pedal pulses are 2+ and equal bilaterally. Neuro: Alert and oriented X 3. Moves all extremities spontaneously. No focal deficits.  Intake/Output: No intake or output data in the 24 hours ending 11/25/12 0739  Inpatient Medications:  . amLODipine  5 mg Oral Daily   And  . irbesartan  300 mg Oral Daily  . aspirin EC  81 mg Oral Daily  . atorvastatin  20 mg Oral q1800  . heparin subcutaneous  5,000 Units Subcutaneous Q8H  . primidone  50 mg Oral BID    Labs:  Recent Labs  11/24/12 0936  CREATININE 0.64    Recent Labs  11/24/12 0936  WBC 7.4  HGB 14.3  HCT 40.7  MCV 89.1  PLT 190    Radiology/Studies: Chest x-ray: pending this AM  Telemetry: atrial fibrillation, rate controlled; intermittent V pacing Device interrogation: performed by industry this AM shows normal PPM function     Assessment and Plan  1. Symptomatic bradycardia s/p PPM implant - doing well post implant; reviewed wound care, activity restrictions and follow-up; if CXR shows stable lead placement without PTX, plan to DC home today 2. Atrial fibrillation - currently rate controlled; CHADS-VASc score is 3 so will require anticoagulation long term; MD to advise  Signed, EDMISTEN, BROOKE  PA-C  EP Attending Patient seen and examined. Agree with above. Ok for discharge. He is at risk for stroke with chronic atrial fib and I have recommended he start Xarelto 20 mg daily. He will need a prescription at discharge. I would like him to start this medication on 11/27/12.  Leonia Reeves.D.

## 2012-11-25 NOTE — Discharge Summary (Signed)
ELECTROPHYSIOLOGY DISCHARGE SUMMARY    Patient ID: Peter Lopez,  MRN: [id number], DOB/AGE: [birth year] 77 y.o.  Admit date: 11/24/2012 Discharge date: 11/25/2012  Primary Care Physician: Kari Baars, MD Primary Cardiologist: Dietrich Pates, MD  Primary Discharge Diagnosis:  1. Symptomatic bradycardia s/p PPM implantation 2. Chronic atrial fibrillation  Secondary Discharge Diagnoses:  1. HTN 2. Dyslipidemia 3. Tobacco abuse 4. History of prostate CA  Procedures This Admission:  1. Single chamber PPM implantation 11/24/2012  RV lead - Medtronic model 5076, 58 cm, active fixation pacing lead, serial number [id number] in [id number]  Device - Medtronic single-chamber pacemaker, serial number [id number]  History and Hospital Course:  Mr. [name] is a 77 year old man with chronic atrial fibrillation and symptomatic bradycardia who presented yesterday for elective PPM implantation. He tolerated this procedure well without any immediate complication. He remains hemodynamically stable and afebrile. His chest xray shows stable lead placement without pneumothorax. His device interrogation shows normal VVI PPM function with stable lead parameters/measurements. His implant site is intact without significant bleeding or hematoma. He has been given discharge instructions including wound care and activity restrictions. He will follow-up in 10 days for wound check. Given his CHADS2-VASc score of 3, chronic anticoagulation was recommended for stroke risk reduction. He will start Xarelto 20 mg once daily on 11/27/2012. Otherwise, there were no changes made to his medications. He has been seen, examined and deemed stable for discharge today by Dr. Lewayne Bunting.  Discharge Vitals: Blood pressure 120/66, pulse 58, temperature 97.5 F (36.4 C), temperature source Oral, resp. rate 18, height 5\' 9"  (1.753 m), weight 162 lb (73.483 kg), SpO2 97.00%.   Labs: Lab Results  Component Value Date   WBC 7.4 11/24/2012   HGB 14.3  11/24/2012   HCT 40.7 11/24/2012   MCV 89.1 11/24/2012   PLT 190 11/24/2012    Recent Labs Lab 11/24/12 0936  CREATININE 0.64    Disposition:  The patient is being discharged in stable condition.  Follow-up: Follow-up Information   Follow up with [facility] On 12/08/2012. (At 9:30 AM for wound check)    Contact information:   [address] [place] [address] [phone]      Follow up with Lewayne Bunting, MD In 3 months. (Our office will schedule )    Contact information:   [address] [place] [address] [phone]     Discharge Medications:    Medication List    STOP taking these medications       aspirin 81 MG tablet      TAKE these medications       AZOR 5-40 MG per tablet  Generic drug:  amLODipine-olmesartan  Take 1 tablet by mouth daily.     folic acid 400 MCG tablet  Commonly known as:  FOLVITE  Take 400 mcg by mouth daily.     methotrexate 2.5 MG tablet  Commonly known as:  RHEUMATREX  Take 5 mg by mouth once a week. sunday     primidone 50 MG tablet  Commonly known as:  MYSOLINE  Take 1 tablet (50 mg total) by mouth 2 (two) times daily.     Rivaroxaban 20 MG Tabs  Commonly known as:  XARELTO  Take 1 tablet (20 mg total) by mouth daily. To be started on 11/27/2012.     simvastatin 40 MG tablet  Commonly known as:  ZOCOR  Take 40 mg by mouth daily.  Duration of Discharge Encounter: Greater than 30 minutes including physician time.  Signed, Rick Duff, PA-C 11/25/2012, 9:49 AM

## 2012-11-25 NOTE — Progress Notes (Signed)
Pt provided with dc instructions and education. Pt verbalized understanding. Pt ahs no questions at this time. IV removed with tip intact. Heart monitor cleaned and returned to front. Levonne Spiller, Rn

## 2012-11-26 ENCOUNTER — Telehealth: Payer: Self-pay

## 2012-11-26 NOTE — Telephone Encounter (Signed)
I called Peter Lopez to find out how he is doing.  He states he is doing well.  He does not have any questions or concerns.  He is aware of his f/u appt on 12/08/12.  I told him to call if he has any questions or concerns.

## 2012-12-02 ENCOUNTER — Ambulatory Visit: Payer: Medicare Other | Admitting: Cardiology

## 2012-12-03 ENCOUNTER — Other Ambulatory Visit (HOSPITAL_COMMUNITY): Payer: Self-pay | Admitting: Pulmonary Disease

## 2012-12-03 DIAGNOSIS — R042 Hemoptysis: Secondary | ICD-10-CM

## 2012-12-04 ENCOUNTER — Ambulatory Visit (HOSPITAL_COMMUNITY)
Admission: RE | Admit: 2012-12-04 | Discharge: 2012-12-04 | Disposition: A | Discharge status: Home / Self Care | Payer: Medicare Other | Source: Ambulatory Visit | Attending: Pulmonary Disease | Admitting: Pulmonary Disease

## 2012-12-04 ENCOUNTER — Encounter (HOSPITAL_COMMUNITY): Payer: Self-pay

## 2012-12-04 DIAGNOSIS — I10 Essential (primary) hypertension: Secondary | ICD-10-CM | POA: Insufficient documentation

## 2012-12-04 DIAGNOSIS — R042 Hemoptysis: Secondary | ICD-10-CM

## 2012-12-04 MED ORDER — IOHEXOL 300 MG/ML  SOLN
80.0000 mL | Freq: Once | INTRAMUSCULAR | Status: AC | PRN
  Administered 2012-12-04: 80 mL via INTRAVENOUS

## 2012-12-08 ENCOUNTER — Other Ambulatory Visit: Payer: Self-pay | Admitting: Internal Medicine

## 2012-12-08 ENCOUNTER — Encounter: Payer: Self-pay | Admitting: *Deleted

## 2012-12-08 ENCOUNTER — Ambulatory Visit (INDEPENDENT_AMBULATORY_CARE_PROVIDER_SITE_OTHER): Payer: Medicare Other | Admitting: *Deleted

## 2012-12-08 DIAGNOSIS — I495 Sick sinus syndrome: Secondary | ICD-10-CM

## 2012-12-08 DIAGNOSIS — I4891 Unspecified atrial fibrillation: Secondary | ICD-10-CM

## 2012-12-08 LAB — PACEMAKER DEVICE OBSERVATION
BATTERY VOLTAGE: 2.8 V
BMOD-0005RV: 95 {beats}/min
RV LEAD IMPEDENCE PM: 676 Ohm
RV LEAD THRESHOLD: 0.5 V

## 2012-12-08 NOTE — Progress Notes (Signed)
Wound check pacemaker in clinic. Normal device function. Battery longevity 10.5 years. ROV in 3 mths w/GT in RDS.

## 2012-12-16 ENCOUNTER — Institutional Professional Consult (permissible substitution): Payer: Medicare Other | Admitting: Internal Medicine

## 2013-01-06 ENCOUNTER — Telehealth: Payer: Self-pay | Admitting: *Deleted

## 2013-01-06 NOTE — Telephone Encounter (Signed)
PT STATES THAT EVER SINCE HIS MEDICATIONS HAVE BEEN CHANGED HE HAS WOKE UP IN THE MORNING AND COUGHED UP BLOOD AND HIS MOUTH TASTE HORRIBLE, HE ALSO STATES THAT HIS SHOULDER HAS BEEN HURTING EVER SINCE HE HAS DEVICE PLACEMENT DONE.

## 2013-01-06 NOTE — Telephone Encounter (Addendum)
Dr. Ladona Ridgel advises that the post procedure soreness can be normal and will likely decrease with time.  Patient referred to PCP for evaluation of hemoptysis. Detailed message on home phone to advise, called pt at work and pt noted he has seen PCP Juanetta Gosling and had a chest CT was normal results.  Patient requests resumption of propranolol, which was discontinued by Dr. Graciela Husbands.  Patient believes that Rx with Xarelto is the cause for daily morning hemoptysis.

## 2013-01-07 NOTE — Telephone Encounter (Signed)
Schedule appointment with me.  Asked patient to bring him samples of his hemoptysis and a small plastic container.

## 2013-01-07 NOTE — Telephone Encounter (Signed)
Scheduled pt for tomorrow at 3pm with RR, pt accepted apt and will bring in sample

## 2013-01-08 ENCOUNTER — Ambulatory Visit: Payer: Medicare Other | Admitting: Cardiology

## 2013-01-09 NOTE — Telephone Encounter (Signed)
Noted pt cancelled apt for yesterday and cancelled, also noted offered apt with RR on 01-19-13, pt declined/cancelled this apt, pt has apt with GT noted on 02-18-13, left message for pt to advise if he has any further concerns with the sxs he discussed previously, please call office to schedule apt

## 2013-01-26 ENCOUNTER — Ambulatory Visit: Payer: Medicare Other | Admitting: Cardiology

## 2013-02-18 ENCOUNTER — Encounter: Payer: Self-pay | Admitting: Internal Medicine

## 2013-02-18 ENCOUNTER — Ambulatory Visit (INDEPENDENT_AMBULATORY_CARE_PROVIDER_SITE_OTHER): Payer: Medicare Other | Admitting: Internal Medicine

## 2013-02-18 VITALS — BP 128/68 | HR 58 | Ht 69.0 in | Wt 164.1 lb

## 2013-02-18 DIAGNOSIS — I4891 Unspecified atrial fibrillation: Secondary | ICD-10-CM

## 2013-02-18 DIAGNOSIS — Z950 Presence of cardiac pacemaker: Secondary | ICD-10-CM

## 2013-02-18 DIAGNOSIS — J40 Bronchitis, not specified as acute or chronic: Secondary | ICD-10-CM

## 2013-02-18 LAB — PACEMAKER DEVICE OBSERVATION
BRDY-0002RV: 50 {beats}/min
BRDY-0004RV: 120 {beats}/min
RV LEAD AMPLITUDE: 11.2 mv
RV LEAD THRESHOLD: 0.75 V

## 2013-02-18 NOTE — Assessment & Plan Note (Signed)
His ventricular rate is well controlled. He will continue his current anticoagulation regimen.

## 2013-02-18 NOTE — Assessment & Plan Note (Signed)
The patient has bronchitis with blood-tinged sputum. He reports that he has undergone evaluation by Dr. Juanetta Gosling. I recommended that he continue his anticoagulation regimen as the benefits outweigh the risk. However if his blood-tinged sputum turns into frank hemoptysis, bronchoscopy would be a consideration.

## 2013-02-18 NOTE — Patient Instructions (Addendum)
Your physician recommends that you schedule a follow-up appointment in: 11/15/13 for device check and Follow up with Ladona Ridgel in 9 months

## 2013-02-18 NOTE — Progress Notes (Signed)
HPI Peter Lopez returns today for followup. He is a very pleasant 77 year old man with symptomatic bradycardia, status post permanent pacemaker insertion, chronic atrial fibrillation, and chronic anticoagulation. The patient complains of bleeding and his gums and coughing up scant amounts of sputum which is blood-tinged. In addition, he has shoulder soreness on the left which he thinks occurred after his pacemaker was inserted. He has pain with lifting of his arm over his head. His syncopal episodes and near syncopal episodes have resolved. He denies peripheral edema, chest pain, or shortness of breath. No Known Allergies   Current Outpatient Prescriptions  Medication Sig Dispense Refill  . amLODipine-olmesartan (AZOR) 5-40 MG per tablet Take 1 tablet by mouth daily.      . folic acid (FOLVITE) 400 MCG tablet Take 400 mcg by mouth daily.       . methotrexate (RHEUMATREX) 2.5 MG tablet Take 5 mg by mouth once a week. sunday      . propranolol (INDERAL) 40 MG tablet Take 40 mg by mouth.       . Rivaroxaban (XARELTO) 20 MG TABS Take 1 tablet (20 mg total) by mouth daily. To be started on 11/27/2012.  30 tablet  4  . simvastatin (ZOCOR) 40 MG tablet Take 40 mg by mouth daily.         No current facility-administered medications for this visit.     Past Medical History  Diagnosis Date  . Exertional dyspnea   . 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 Hypertension   . Hyperlipidemia   . Benign essential tremor   . Tobacco abuse     30  pack years; discontinued in 2000; currently one pack per week  . Prostate cancer     S/P Prostatectomy  . Pacemaker 11/24/2012    Dr Ladona Ridgel    ROS:   All systems reviewed and negative except as noted in the HPI.   Past Surgical History  Procedure Laterality Date  . Meniscectomy      Right knee open meniscectomy  . Knee surgery      Left laparoscopic knee surgery  . Robot assisted laparoscopic radical prostatectomy    . Appendectomy    . Colonoscopy   2011  . Hernia repair    . Insert / replace / remove pacemaker  11/24/2012    Dr Lewayne Bunting     Family History  Problem Relation Age of Onset  . Alzheimer's disease Mother 54  . Early death Father 49    trauma  . Heart failure Brother      History   Social History  . Marital Status: Married    Spouse Name: N/A    Number of Children: N/A  . Years of Education: N/A   Occupational History  . Company secretary    Social History Main Topics  . Smoking status: Former Smoker -- 1.00 packs/day    Quit date: 12/11/1998  . Smokeless tobacco: Never Used  . Alcohol Use: Yes     Comment: modest  . Drug Use: No  . Sexually Active: Not on file   Other Topics Concern  . Not on file   Social History Narrative  . No narrative on file     BP 128/68  Pulse 58  Ht 5\' 9"  (1.753 m)  Wt 164 lb 1.3 oz (74.426 kg)  BMI 24.22 kg/m2  SpO2 97%  Physical Exam:  Well appearing 77 year old man,NAD HEENT: Unremarkable Neck:  6 cm JVD, no  thyromegally Lungs:  Clear with no wheezes, rales, or rhonchi. HEART:  IRegular rate rhythm, no murmurs, no rubs, no clicks Abd:  soft, positive bowel sounds, no organomegally, no rebound, no guarding Ext:  2 plus pulses, no edema, no cyanosis, no clubbing Skin:  No rashes no nodules Neuro:  CN II through XII intact, motor grossly intact  DEVICE  Normal device function.  See PaceArt for details.   Assess/Plan:

## 2013-02-18 NOTE — Assessment & Plan Note (Signed)
His St. Jude single chamber pacemaker is working normally. We'll plan to recheck in several months. 

## 2013-03-05 ENCOUNTER — Ambulatory Visit (INDEPENDENT_AMBULATORY_CARE_PROVIDER_SITE_OTHER): Payer: Medicare Other | Admitting: Otolaryngology

## 2013-03-05 DIAGNOSIS — R042 Hemoptysis: Secondary | ICD-10-CM

## 2013-03-12 ENCOUNTER — Telehealth: Payer: Self-pay | Admitting: *Deleted

## 2013-03-12 NOTE — Telephone Encounter (Signed)
Dr Daleen Squibb gave verbal instructions concerning medication management to Dr Juanetta Gosling via telephone per pt recent dx of hemoptysis, Dr Daleen Squibb advised for pt to STOP taking the Xarelto and START taking Aspirin 325mg  once daily, Dr Juanetta Gosling will advise pt about instructions that Dr Daleen Squibb advised to implement changes asap

## 2013-09-30 ENCOUNTER — Ambulatory Visit (INDEPENDENT_AMBULATORY_CARE_PROVIDER_SITE_OTHER): Payer: Medicare Other | Admitting: Internal Medicine

## 2013-09-30 ENCOUNTER — Encounter: Payer: Self-pay | Admitting: Internal Medicine

## 2013-09-30 DIAGNOSIS — Z950 Presence of cardiac pacemaker: Secondary | ICD-10-CM

## 2013-09-30 DIAGNOSIS — I4891 Unspecified atrial fibrillation: Secondary | ICD-10-CM

## 2013-09-30 LAB — MDC_IDC_ENUM_SESS_TYPE_INCLINIC
Battery Remaining Longevity: 109 mo
Battery Voltage: 2.79 V
Date Time Interrogation Session: 20150211092206
Lead Channel Pacing Threshold Amplitude: 0.75 V
Lead Channel Pacing Threshold Pulse Width: 0.4 ms
MDC IDC MSMT BATTERY IMPEDANCE: 158 Ohm
MDC IDC MSMT LEADCHNL RA IMPEDANCE VALUE: 0 Ohm
MDC IDC MSMT LEADCHNL RV IMPEDANCE VALUE: 724 Ohm
MDC IDC MSMT LEADCHNL RV SENSING INTR AMPL: 15.67 mV
MDC IDC SET LEADCHNL RV PACING AMPLITUDE: 2.5 V
MDC IDC SET LEADCHNL RV PACING PULSEWIDTH: 0.4 ms
MDC IDC SET LEADCHNL RV SENSING SENSITIVITY: 4 mV
MDC IDC STAT BRADY RV PERCENT PACED: 98 %

## 2013-09-30 NOTE — Patient Instructions (Signed)
Your physician recommends that you schedule a follow-up appointment in: 12 months with Dr Knox Saliva will receive a reminder letter two months in advance reminding you to call and schedule your appointment. If you don't receive this letter, please contact our office.  Remote monitoring is used to monitor your Pacemaker or ICD from home. This monitoring reduces the number of office visits required to check your device to one time per year. It allows [place] to keep an eye on the functioning of your device to ensure it is working properly. You are scheduled for a device check from home on May 18 th. You may send your transmission at any time that day. If you have a wireless device, the transmission will be sent automatically. After your physician reviews your transmission, you will receive a postcard with your next transmission date.

## 2013-09-30 NOTE — Assessment & Plan Note (Signed)
His Medtronic VVIR PPM is working normally. Will recheck in several months.

## 2013-09-30 NOTE — Progress Notes (Signed)
HPI [name] returns today for followup. He is a very pleasant 78 year old man with symptomatic bradycardia, status post permanent pacemaker insertion, chronic atrial fibrillation, and chronic anticoagulation. The patient noted that on systemic anticoagulation, he had hemoptysis, and bleeding gums. This resolved with cessation of his meds. Workup of the hemoptysis including ENT evaluation and CT scan of the chest have been unrevealing. He denies peripheral edema, chest pain, or shortness of breath. No Known Allergies   Current Outpatient Prescriptions  Medication Sig Dispense Refill  . amLODipine-olmesartan (AZOR) 5-40 MG per tablet Take 1 tablet by mouth daily.      . folic acid (FOLVITE) 941 MCG tablet Take 400 mcg by mouth daily.       . methotrexate (RHEUMATREX) 2.5 MG tablet Take 5 mg by mouth once a week. sunday      . propranolol (INDERAL) 40 MG tablet Take 40 mg by mouth.       . Rivaroxaban (XARELTO) 20 MG TABS Take 1 tablet (20 mg total) by mouth daily. To be started on 11/27/2012.  30 tablet  4  . simvastatin (ZOCOR) 40 MG tablet Take 40 mg by mouth daily.         No current facility-administered medications for this visit.     Past Medical History  Diagnosis Date  . Exertional dyspnea   . 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 Hypertension   . Hyperlipidemia   . Benign essential tremor   . Tobacco abuse     30  pack years; discontinued in 2000; currently one pack per week  . Prostate cancer     S/P Prostatectomy  . Pacemaker 11/24/2012    Dr Lovena Le    ROS:   All systems reviewed and negative except as noted in the HPI.   Past Surgical History  Procedure Laterality Date  . Meniscectomy      Right knee open meniscectomy  . Knee surgery      Left laparoscopic knee surgery  . Robot assisted laparoscopic radical prostatectomy    . Appendectomy    . Colonoscopy  2011  . Hernia repair    . Insert / replace / remove pacemaker  11/24/2012    Dr Cristopher Peru      Family History  Problem Relation Age of Onset  . Alzheimer's disease Mother 40  . Early death Father 62    trauma  . Heart failure Brother      History   Social History  . Marital Status: Married    Spouse Name: N/A    Number of Children: N/A  . Years of Education: N/A   Occupational History  . Runner, broadcasting/film/video    Social History Main Topics  . Smoking status: Former Smoker -- 1.00 packs/day    Quit date: 12/11/1998  . Smokeless tobacco: Never Used  . Alcohol Use: Yes     Comment: modest  . Drug Use: No  . Sexual Activity: Not on file   Other Topics Concern  . Not on file   Social History Narrative  . No narrative on file     BP 158/97, P 84, R - 16 Physical Exam:  Well appearing 78 year old man,NAD HEENT: Unremarkable Neck:  6 cm JVD, no thyromegally Lungs:  Clear with no wheezes, rales, or rhonchi. HEART:  IRegular rate rhythm, no murmurs, no rubs, no clicks Abd:  soft, positive bowel sounds, no organomegally, no rebound, no guarding Ext:  2 plus pulses, no edema,  no cyanosis, no clubbing Skin:  No rashes no nodules Neuro:  CN II through XII intact, motor grossly intact  DEVICE  Normal device function.  See PaceArt for details.   Assess/Plan:

## 2013-09-30 NOTE — Assessment & Plan Note (Signed)
His ventricular rate is well controlled. Will follow. He is not a candidate for any anti-coagulation.

## 2013-10-26 ENCOUNTER — Encounter: Payer: Medicare Other | Admitting: Internal Medicine

## 2013-10-26 ENCOUNTER — Other Ambulatory Visit (HOSPITAL_COMMUNITY): Payer: Self-pay

## 2013-10-26 DIAGNOSIS — J441 Chronic obstructive pulmonary disease with (acute) exacerbation: Secondary | ICD-10-CM

## 2013-10-26 LAB — PULMONARY FUNCTION TEST
DL/VA % PRED: 43 %
DL/VA: 1.98 ml/min/mmHg/L
DLCO UNC % PRED: 43 %
DLCO cor % pred: 43 %
DLCO cor: 13.33 ml/min/mmHg
DLCO unc: 13.33 ml/min/mmHg
FEF 25-75 Post: 0.75 L/sec
FEF 25-75 Pre: 0.75 L/sec
FEF2575-%Change-Post: 0 %
FEF2575-%PRED-POST: 36 %
FEF2575-%Pred-Pre: 36 %
FEV1-%CHANGE-POST: 5 %
FEV1-%PRED-POST: 69 %
FEV1-%Pred-Pre: 65 %
FEV1-PRE: 1.9 L
FEV1-Post: 2.01 L
FEV1FVC-%Change-Post: 9 %
FEV1FVC-%PRED-PRE: 64 %
FEV6-%Change-Post: -4 %
FEV6-%PRED-PRE: 103 %
FEV6-%Pred-Post: 99 %
FEV6-POST: 3.75 L
FEV6-PRE: 3.91 L
FEV6FVC-%CHANGE-POST: 0 %
FEV6FVC-%PRED-PRE: 101 %
FEV6FVC-%Pred-Post: 101 %
FVC-%Change-Post: -3 %
FVC-%PRED-PRE: 102 %
FVC-%Pred-Post: 98 %
FVC-PRE: 4.11 L
FVC-Post: 3.96 L
POST FEV6/FVC RATIO: 96 %
PRE FEV6/FVC RATIO: 95 %
Post FEV1/FVC ratio: 51 %
Pre FEV1/FVC ratio: 46 %
RV % PRED: 87 %
RV: 2.2 L
TLC % pred: 111 %
TLC: 7.64 L

## 2013-11-04 ENCOUNTER — Ambulatory Visit (HOSPITAL_COMMUNITY)
Admission: RE | Admit: 2013-11-04 | Discharge: 2013-11-04 | Disposition: A | Discharge status: Home / Self Care | Payer: Medicare Other | Source: Ambulatory Visit | Attending: Pulmonary Disease | Admitting: Pulmonary Disease

## 2013-11-04 DIAGNOSIS — J4489 Other specified chronic obstructive pulmonary disease: Secondary | ICD-10-CM | POA: Insufficient documentation

## 2013-11-04 DIAGNOSIS — J449 Chronic obstructive pulmonary disease, unspecified: Secondary | ICD-10-CM | POA: Insufficient documentation

## 2013-11-04 DIAGNOSIS — R0609 Other forms of dyspnea: Secondary | ICD-10-CM | POA: Insufficient documentation

## 2013-11-04 DIAGNOSIS — R0989 Other specified symptoms and signs involving the circulatory and respiratory systems: Principal | ICD-10-CM | POA: Insufficient documentation

## 2013-11-04 MED ORDER — ALBUTEROL SULFATE (2.5 MG/3ML) 0.083% IN NEBU
2.5000 mg | INHALATION_SOLUTION | Freq: Once | RESPIRATORY_TRACT | Status: AC
  Administered 2013-11-04: 2.5 mg via RESPIRATORY_TRACT

## 2013-11-06 NOTE — Procedures (Signed)
[name], Peter Lopez                ACCOUNT NO.:  [id number]  MEDICAL RECORD NO.:  [id number]  LOCATION:  RESP                          FACILITY:  [facility]  PHYSICIAN:  Hutson Luft L. Luan Pulling, M.D.DATE OF BIRTH:  [birth year]  DATE OF PROCEDURE: DATE OF DISCHARGE:  11/04/2013                           PULMONARY FUNCTION TEST   REASON FOR PULMONARY FUNCTION TESTING:  Chronic obstructive pulmonary disease.  1. Spirometry shows a mild-to-moderate ventilatory defect with     evidence of airflow obstruction. 2. Lung volumes are normal. 3. Airway resistance is high, confirming the presence of airflow     obstruction. 4. DLCO is severely reduced. 5. This study is consistent with the clinical diagnosis of COPD.     Marlisa Caridi L. Luan Pulling, M.D.     ELH/MEDQ  D:  11/06/2013  T:  11/06/2013  Job:  [id number]

## 2014-01-19 ENCOUNTER — Encounter: Payer: Self-pay | Admitting: Cardiology

## 2014-01-21 ENCOUNTER — Ambulatory Visit (INDEPENDENT_AMBULATORY_CARE_PROVIDER_SITE_OTHER): Payer: Medicare Other | Admitting: *Deleted

## 2014-01-21 DIAGNOSIS — I495 Sick sinus syndrome: Secondary | ICD-10-CM

## 2014-01-21 LAB — MDC_IDC_ENUM_SESS_TYPE_REMOTE
Battery Impedance: 206 Ohm
Brady Statistic RV Percent Paced: 97 %
Date Time Interrogation Session: 20150604094703
Lead Channel Impedance Value: 0 Ohm
Lead Channel Impedance Value: 676 Ohm
Lead Channel Setting Pacing Amplitude: 2.5 V
Lead Channel Setting Sensing Sensitivity: 4 mV
MDC IDC MSMT BATTERY REMAINING LONGEVITY: 103 mo
MDC IDC MSMT BATTERY VOLTAGE: 2.79 V
MDC IDC MSMT LEADCHNL RV PACING THRESHOLD AMPLITUDE: 0.625 V
MDC IDC MSMT LEADCHNL RV PACING THRESHOLD PULSEWIDTH: 0.4 ms
MDC IDC SET LEADCHNL RV PACING PULSEWIDTH: 0.4 ms

## 2014-01-22 NOTE — Progress Notes (Signed)
Remote pacemaker transmission.   

## 2014-02-09 ENCOUNTER — Encounter: Payer: Self-pay | Admitting: Cardiology

## 2014-02-09 ENCOUNTER — Telehealth: Payer: Self-pay | Admitting: Internal Medicine

## 2014-02-09 NOTE — Telephone Encounter (Signed)
New message     Did we get his remote transmission report?

## 2014-02-09 NOTE — Telephone Encounter (Signed)
Informed pt wife that transmission was received.  

## 2014-02-23 ENCOUNTER — Encounter: Payer: Self-pay | Admitting: Internal Medicine

## 2014-03-29 ENCOUNTER — Encounter: Payer: Self-pay | Admitting: Internal Medicine

## 2014-04-27 ENCOUNTER — Ambulatory Visit (INDEPENDENT_AMBULATORY_CARE_PROVIDER_SITE_OTHER): Payer: Medicare Other | Admitting: *Deleted

## 2014-04-27 DIAGNOSIS — I495 Sick sinus syndrome: Secondary | ICD-10-CM

## 2014-04-27 LAB — MDC_IDC_ENUM_SESS_TYPE_REMOTE
Battery Remaining Longevity: 101 mo
Brady Statistic RV Percent Paced: 98 %
Date Time Interrogation Session: 20150908082221
Lead Channel Impedance Value: 0 Ohm
Lead Channel Impedance Value: 689 Ohm
Lead Channel Pacing Threshold Amplitude: 0.75 V
Lead Channel Pacing Threshold Pulse Width: 0.4 ms
Lead Channel Setting Sensing Sensitivity: 4 mV
MDC IDC MSMT BATTERY IMPEDANCE: 231 Ohm
MDC IDC MSMT BATTERY VOLTAGE: 2.78 V
MDC IDC SET LEADCHNL RV PACING AMPLITUDE: 2.5 V
MDC IDC SET LEADCHNL RV PACING PULSEWIDTH: 0.4 ms

## 2014-04-27 NOTE — Progress Notes (Signed)
Remote pacemaker transmission.   

## 2014-05-11 ENCOUNTER — Encounter: Payer: Self-pay | Admitting: Cardiology

## 2014-05-13 ENCOUNTER — Encounter: Payer: Self-pay | Admitting: Internal Medicine

## 2014-07-29 ENCOUNTER — Ambulatory Visit (INDEPENDENT_AMBULATORY_CARE_PROVIDER_SITE_OTHER): Payer: Medicare Other | Admitting: *Deleted

## 2014-07-29 ENCOUNTER — Encounter (HOSPITAL_COMMUNITY): Payer: Self-pay | Admitting: Internal Medicine

## 2014-07-29 DIAGNOSIS — I495 Sick sinus syndrome: Secondary | ICD-10-CM

## 2014-07-29 NOTE — Progress Notes (Signed)
Remote pacemaker transmission.   

## 2014-08-10 LAB — MDC_IDC_ENUM_SESS_TYPE_REMOTE
Battery Impedance: 231 Ohm
Battery Remaining Longevity: 100 mo
Brady Statistic RV Percent Paced: 97 %
Date Time Interrogation Session: 20151210105224
Lead Channel Impedance Value: 658 Ohm
Lead Channel Setting Sensing Sensitivity: 4 mV
MDC IDC MSMT BATTERY VOLTAGE: 2.78 V
MDC IDC MSMT LEADCHNL RA IMPEDANCE VALUE: 0 Ohm
MDC IDC MSMT LEADCHNL RV PACING THRESHOLD AMPLITUDE: 0.75 V
MDC IDC MSMT LEADCHNL RV PACING THRESHOLD PULSEWIDTH: 0.4 ms
MDC IDC SET LEADCHNL RV PACING AMPLITUDE: 2.5 V
MDC IDC SET LEADCHNL RV PACING PULSEWIDTH: 0.4 ms

## 2014-08-24 ENCOUNTER — Encounter: Payer: Self-pay | Admitting: Cardiology

## 2014-08-30 ENCOUNTER — Encounter: Payer: Self-pay | Admitting: Internal Medicine

## 2014-10-22 ENCOUNTER — Encounter: Payer: Self-pay | Admitting: Internal Medicine

## 2014-10-22 ENCOUNTER — Ambulatory Visit (INDEPENDENT_AMBULATORY_CARE_PROVIDER_SITE_OTHER): Payer: PPO | Admitting: Internal Medicine

## 2014-10-22 VITALS — BP 152/80 | HR 78 | Ht 69.0 in | Wt 148.9 lb

## 2014-10-22 DIAGNOSIS — I10 Essential (primary) hypertension: Secondary | ICD-10-CM

## 2014-10-22 DIAGNOSIS — R251 Tremor, unspecified: Secondary | ICD-10-CM

## 2014-10-22 DIAGNOSIS — F17200 Nicotine dependence, unspecified, uncomplicated: Secondary | ICD-10-CM

## 2014-10-22 DIAGNOSIS — R634 Abnormal weight loss: Secondary | ICD-10-CM

## 2014-10-22 DIAGNOSIS — G250 Essential tremor: Secondary | ICD-10-CM

## 2014-10-22 DIAGNOSIS — I4891 Unspecified atrial fibrillation: Secondary | ICD-10-CM | POA: Diagnosis not present

## 2014-10-22 DIAGNOSIS — G252 Other specified forms of tremor: Secondary | ICD-10-CM

## 2014-10-22 DIAGNOSIS — Z720 Tobacco use: Secondary | ICD-10-CM

## 2014-10-22 DIAGNOSIS — Z950 Presence of cardiac pacemaker: Secondary | ICD-10-CM

## 2014-10-22 LAB — MDC_IDC_ENUM_SESS_TYPE_INCLINIC
Battery Impedance: 279 Ohm
Battery Voltage: 2.78 V
Date Time Interrogation Session: 20160304113225
Lead Channel Impedance Value: 0 Ohm
Lead Channel Impedance Value: 685 Ohm
Lead Channel Pacing Threshold Pulse Width: 0.4 ms
Lead Channel Sensing Intrinsic Amplitude: 15.67 mV
Lead Channel Setting Sensing Sensitivity: 4 mV
MDC IDC MSMT BATTERY REMAINING LONGEVITY: 97 mo
MDC IDC MSMT LEADCHNL RV PACING THRESHOLD AMPLITUDE: 0.75 V
MDC IDC SET LEADCHNL RV PACING AMPLITUDE: 2.5 V
MDC IDC SET LEADCHNL RV PACING PULSEWIDTH: 0.4 ms
MDC IDC STAT BRADY RV PERCENT PACED: 98 %

## 2014-10-22 LAB — PACEMAKER DEVICE OBSERVATION

## 2014-10-22 NOTE — Assessment & Plan Note (Signed)
His medtronic DDD PM is working normally. Will recheck in several months.

## 2014-10-22 NOTE — Assessment & Plan Note (Signed)
He is encouraged to stop smoking. Will follow.

## 2014-10-22 NOTE — Assessment & Plan Note (Signed)
The etiology is uncertain. He will undergo CXR as he has a longstanding h/o tobacco abuse. If negative he will followup with medical MD for additional evaluation.

## 2014-10-22 NOTE — Assessment & Plan Note (Signed)
His blood pressure is elevated. He admits to dietary and medical non-compliance. He is encouraged to reduce his sodium intake.

## 2014-10-22 NOTE — Progress Notes (Signed)
HPI [name] returns today for followup. He is a very pleasant 79 year old man with symptomatic bradycardia, status post permanent pacemaker insertion, chronic atrial fibrillation, and chronic anticoagulation. The patient noted that on systemic anticoagulation.  He denies peripheral edema, chest pain, or shortness of breath. He has lost about 20 lbs in the past few months. He denies cough or hemoptysis. He states that his appetite is down and he does not want to eat as much.  Allergies  Allergen Reactions  . Rivaroxaban     Other reaction(s): Other (See Comments) Coughing up blood     Current Outpatient Prescriptions  Medication Sig Dispense Refill  . amLODipine-olmesartan (AZOR) 5-40 MG per tablet Take 1 tablet by mouth daily.    Marland Kitchen aspirin 325 MG tablet Take 325 mg by mouth daily.    . folic acid (FOLVITE) 482 MCG tablet Take 400 mcg by mouth daily.     . methotrexate (RHEUMATREX) 2.5 MG tablet Take 5 mg by mouth once a week. sunday    . propranolol (INDERAL) 40 MG tablet Take 40 mg by mouth.     . simvastatin (ZOCOR) 40 MG tablet Take 40 mg by mouth daily.       No current facility-administered medications for this visit.     Past Medical History  Diagnosis Date  . Exertional dyspnea   . 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 Hypertension   . Hyperlipidemia   . Benign essential tremor   . Tobacco abuse     30  pack years; discontinued in 2000; currently one pack per week  . Prostate cancer     S/P Prostatectomy  . Pacemaker 11/24/2012    Dr Lovena Le    ROS:   All systems reviewed and negative except as noted in the HPI.   Past Surgical History  Procedure Laterality Date  . Meniscectomy      Right knee open meniscectomy  . Knee surgery      Left laparoscopic knee surgery  . Robot assisted laparoscopic radical prostatectomy    . Appendectomy    . Colonoscopy  2011  . Hernia repair    . Insert / replace / remove pacemaker  11/24/2012    Dr Cristopher Peru  .  Permanent pacemaker insertion N/A 11/24/2012    Procedure: PERMANENT PACEMAKER INSERTION;  Surgeon: Evans Lance, MD;  Location: [facility] CATH LAB;  Service: Cardiovascular;  Laterality: N/A;     Family History  Problem Relation Age of Onset  . Alzheimer's disease Mother 65  . Early death Father 50    trauma  . Heart failure Brother      History   Social History  . Marital Status: Married    Spouse Name: N/A  . Number of Children: N/A  . Years of Education: N/A   Occupational History  . Runner, broadcasting/film/video    Social History Main Topics  . Smoking status: Former Smoker -- 1.00 packs/day    Quit date: 12/11/1998  . Smokeless tobacco: Never Used  . Alcohol Use: Yes     Comment: modest  . Drug Use: No  . Sexual Activity: Not on file   Other Topics Concern  . Not on file   Social History Narrative     BP 152/80, P 78, R - 18 Physical Exam:  Well appearing 79 year old man,NAD HEENT: Unremarkable Neck:  6 cm JVD, no thyromegally Lungs:  Clear with no wheezes, rales, or rhonchi. HEART:  IRegular rate rhythm,  no murmurs, no rubs, no clicks Abd:  soft, positive bowel sounds, no organomegally, no rebound, no guarding Ext:  2 plus pulses, no edema, no cyanosis, no clubbing Skin:  No rashes no nodules Neuro:  CN II through XII intact, motor grossly intact  DEVICE  Normal device function.  See PaceArt for details.   Assess/Plan:

## 2014-10-22 NOTE — Patient Instructions (Signed)
Your physician wants you to follow-up in: 1 year with Dr. Lovena Le. You will receive a reminder letter in the mail two months in advance. If you don't receive a letter, please call our office to schedule the follow-up appointment.  Remote monitoring is used to monitor your Pacemaker of ICD from home. This monitoring reduces the number of office visits required to check your device to one time per year. It allows [place] to keep an eye on the functioning of your device to ensure it is working properly. You are scheduled for a device check from home on Junes 6. You may send your transmission at any time that day. If you have a wireless device, the transmission will be sent automatically. After your physician reviews your transmission, you will receive a postcard with your next transmission date.  Please have Chest X-Ray done today.  Your physician recommends that you continue on your current medications as directed. Please refer to the Current Medication list given to you today.  Thank you for choosing [facility]!

## 2014-10-25 ENCOUNTER — Encounter: Payer: Self-pay | Admitting: Internal Medicine

## 2015-01-24 ENCOUNTER — Ambulatory Visit (INDEPENDENT_AMBULATORY_CARE_PROVIDER_SITE_OTHER): Payer: PPO | Admitting: *Deleted

## 2015-01-24 DIAGNOSIS — I495 Sick sinus syndrome: Secondary | ICD-10-CM

## 2015-01-25 NOTE — Progress Notes (Signed)
Remote pacemaker transmission.   

## 2015-01-27 LAB — CUP PACEART REMOTE DEVICE CHECK
Battery Impedance: 378 Ohm
Date Time Interrogation Session: 20160605221652
Lead Channel Impedance Value: 0 Ohm
Lead Channel Impedance Value: 665 Ohm
Lead Channel Pacing Threshold Pulse Width: 0.4 ms
Lead Channel Setting Pacing Amplitude: 2.5 V
MDC IDC MSMT BATTERY REMAINING LONGEVITY: 90 mo
MDC IDC MSMT BATTERY VOLTAGE: 2.78 V
MDC IDC MSMT LEADCHNL RV PACING THRESHOLD AMPLITUDE: 0.75 V
MDC IDC SET LEADCHNL RV PACING PULSEWIDTH: 0.4 ms
MDC IDC SET LEADCHNL RV SENSING SENSITIVITY: 2.8 mV
MDC IDC STAT BRADY RV PERCENT PACED: 99 %

## 2015-02-07 ENCOUNTER — Encounter: Payer: Self-pay | Admitting: Cardiology

## 2015-02-09 ENCOUNTER — Encounter: Payer: Self-pay | Admitting: Internal Medicine

## 2015-04-26 ENCOUNTER — Ambulatory Visit (INDEPENDENT_AMBULATORY_CARE_PROVIDER_SITE_OTHER): Payer: PPO | Admitting: *Deleted

## 2015-04-26 DIAGNOSIS — I495 Sick sinus syndrome: Secondary | ICD-10-CM | POA: Diagnosis not present

## 2015-04-26 NOTE — Progress Notes (Signed)
Remote pacemaker transmission.   

## 2015-04-27 NOTE — Patient Instructions (Signed)
Peter Lopez  04/27/2015     '@PREFPERIOPPHARMACY'$ @   Your procedure is scheduled on 05/03/2015.  Report to [facility] at 9:30 A.M  Call this number if you have problems the morning of surgery:  [phone]   Remember:  Do not eat food or drink liquids after midnight.  Take these medicines the morning of surgery with A SIP OF WATER Folic Acid, Methotrexate, Inderal   Do not wear jewelry, make-up or nail polish.  Do not wear lotions, powders, or perfumes.  You may wear deodorant.  Do not shave 48 hours prior to surgery.  Men may shave face and neck.  Do not bring valuables to the hospital.  [facility] is not responsible for any belongings or valuables.  Contacts, dentures or bridgework may not be worn into surgery.  Leave your suitcase in the car.  After surgery it may be brought to your room.  For patients admitted to the hospital, discharge time will be determined by your treatment team.  Patients discharged the day of surgery will not be allowed to drive home.    Please read over the following fact sheets that you were given. Anesthesia Post-op Instructions     PATIENT INSTRUCTIONS POST-ANESTHESIA  IMMEDIATELY FOLLOWING SURGERY:  Do not drive or operate machinery for the first twenty four hours after surgery.  Do not make any important decisions for twenty four hours after surgery or while taking narcotic pain medications or sedatives.  If you develop intractable nausea and vomiting or a severe headache please notify your doctor immediately.  FOLLOW-UP:  Please make an appointment with your surgeon as instructed. You do not need to follow up with anesthesia unless specifically instructed to do so.  WOUND CARE INSTRUCTIONS (if applicable):  Keep a dry clean dressing on the anesthesia/puncture wound site if there is drainage.  Once the wound has quit draining you may leave it open to air.  Generally you should leave the bandage intact for twenty four hours unless there is  drainage.  If the epidural site drains for more than 36-48 hours please call the anesthesia department.  QUESTIONS?:  Please feel free to call your physician or the hospital operator if you have any questions, and they will be happy to assist you.      Cataract Surgery  A cataract is a clouding of the lens of the eye. When a lens becomes cloudy, vision is reduced based on the degree and nature of the clouding. Surgery may be needed to improve vision. Surgery removes the cloudy lens and usually replaces it with a substitute lens (intraocular lens, IOL). LET YOUR EYE DOCTOR KNOW ABOUT:  Allergies to food or medicine.  Medicines taken including herbs, eye drops, over-the-counter medicines, and creams.  Use of steroids (by mouth or creams).  Previous problems with anesthetics or numbing medicine.  History of bleeding problems or blood clots.  Previous surgery.  Other health problems, including diabetes and kidney problems.  Possibility of pregnancy, if this applies. RISKS AND COMPLICATIONS  Infection.  Inflammation of the eyeball (endophthalmitis) that can spread to both eyes (sympathetic ophthalmia).  Poor wound healing.  If an IOL is inserted, it can later fall out of proper position. This is very uncommon.  Clouding of the part of your eye that holds an IOL in place. This is called an "after-cataract." These are uncommon but easily treated. BEFORE THE PROCEDURE  Do not eat or drink anything except small amounts of water for 8 to  12 before your surgery, or as directed by your caregiver.  Unless you are told otherwise, continue any eye drops you have been prescribed.  Talk to your primary caregiver about all other medicines that you take (both prescription and nonprescription). In some cases, you may need to stop or change medicines near the time of your surgery. This is most important if you are taking blood-thinning medicine.Do not stop medicines unless you are told to do  so.  Arrange for someone to drive you to and from the procedure.  Do not put contact lenses in either eye on the day of your surgery. PROCEDURE There is more than one method for safely removing a cataract. Your doctor can explain the differences and help determine which is best for you. Phacoemulsification surgery is the most common form of cataract surgery.  An injection is given behind the eye or eye drops are given to make this a painless procedure.  A small cut (incision) is made on the edge of the clear, dome-shaped surface that covers the front of the eye (cornea).  A tiny probe is painlessly inserted into the eye. This device gives off ultrasound waves that soften and break up the cloudy center of the lens. This makes it easier for the cloudy lens to be removed by suction.  An IOL may be implanted.  The normal lens of the eye is covered by a clear capsule. Part of that capsule is intentionally left in the eye to support the IOL.  Your surgeon may or may not use stitches to close the incision. There are other forms of cataract surgery that require a larger incision and stitches to close the eye. This approach is taken in cases where the doctor feels that the cataract cannot be easily removed using phacoemulsification. AFTER THE PROCEDURE  When an IOL is implanted, it does not need care. It becomes a permanent part of your eye and cannot be seen or felt.  Your doctor will schedule follow-up exams to check on your progress.  Review your other medicines with your doctor to see which can be resumed after surgery.  Use eye drops or take medicine as prescribed by your doctor. Document Released: 07/26/2011 Document Revised: 12/21/2013 Document Reviewed: 07/26/2011 [facility] Patient Information 2015 [place], [place]. This information is not intended to replace advice given to you by your health care provider. Make sure you discuss any questions you have with your health care provider.

## 2015-04-28 ENCOUNTER — Encounter (HOSPITAL_COMMUNITY)
Admission: RE | Admit: 2015-04-28 | Discharge: 2015-04-28 | Disposition: A | Discharge status: Home / Self Care | Payer: PPO | Source: Ambulatory Visit | Attending: Ophthalmology | Admitting: Ophthalmology

## 2015-04-28 ENCOUNTER — Encounter (HOSPITAL_COMMUNITY): Payer: Self-pay

## 2015-04-28 DIAGNOSIS — H269 Unspecified cataract: Secondary | ICD-10-CM | POA: Diagnosis not present

## 2015-04-28 DIAGNOSIS — Z01812 Encounter for preprocedural laboratory examination: Secondary | ICD-10-CM | POA: Insufficient documentation

## 2015-04-28 LAB — BASIC METABOLIC PANEL
Anion gap: 9 (ref 5–15)
BUN: 19 mg/dL (ref 6–20)
CHLORIDE: 103 mmol/L (ref 101–111)
CO2: 23 mmol/L (ref 22–32)
CREATININE: 0.56 mg/dL — AB (ref 0.61–1.24)
Calcium: 9.1 mg/dL (ref 8.9–10.3)
GFR calc Af Amer: >60 mL/min (ref >60)
GFR calc non Af Amer: >60 mL/min (ref >60)
GLUCOSE: 102 mg/dL — AB (ref 65–99)
Potassium: 4.3 mmol/L (ref 3.5–5.1)
Sodium: 135 mmol/L (ref 135–145)

## 2015-04-28 LAB — CBC
HEMATOCRIT: 47.4 % (ref 39.0–52.0)
HEMOGLOBIN: 15.7 g/dL (ref 13.0–17.0)
MCH: 32.4 pg (ref 26.0–34.0)
MCHC: 33.1 g/dL (ref 30.0–36.0)
MCV: 97.7 fL (ref 78.0–100.0)
Platelets: 207 10*3/uL (ref 150–400)
RBC: 4.85 MIL/uL (ref 4.22–5.81)
RDW: 13.8 % (ref 11.5–15.5)
WBC: 7.6 10*3/uL (ref 4.0–10.5)

## 2015-05-02 MED ORDER — KETOROLAC TROMETHAMINE 0.5 % OP SOLN
OPHTHALMIC | Status: AC
  Filled 2015-05-02: qty 5

## 2015-05-02 MED ORDER — TETRACAINE HCL 0.5 % OP SOLN
OPHTHALMIC | Status: AC
  Filled 2015-05-02: qty 2

## 2015-05-02 MED ORDER — PHENYLEPHRINE HCL 2.5 % OP SOLN
OPHTHALMIC | Status: AC
  Filled 2015-05-02: qty 15

## 2015-05-02 MED ORDER — CYCLOPENTOLATE-PHENYLEPHRINE OP SOLN OPTIME - NO CHARGE
OPHTHALMIC | Status: AC
  Filled 2015-05-02: qty 2

## 2015-05-03 ENCOUNTER — Encounter (HOSPITAL_COMMUNITY): Payer: Self-pay | Admitting: *Deleted

## 2015-05-03 ENCOUNTER — Encounter (HOSPITAL_COMMUNITY)
Admission: RE | Disposition: A | Discharge status: Home / Self Care | Payer: Self-pay | Source: Ambulatory Visit | Attending: Ophthalmology

## 2015-05-03 ENCOUNTER — Ambulatory Visit (HOSPITAL_COMMUNITY): Payer: PPO | Admitting: Anesthesiology

## 2015-05-03 ENCOUNTER — Ambulatory Visit (HOSPITAL_COMMUNITY)
Admission: RE | Admit: 2015-05-03 | Discharge: 2015-05-03 | Disposition: A | Discharge status: Home / Self Care | Payer: PPO | Source: Ambulatory Visit | Attending: Ophthalmology | Admitting: Ophthalmology

## 2015-05-03 DIAGNOSIS — I10 Essential (primary) hypertension: Secondary | ICD-10-CM | POA: Insufficient documentation

## 2015-05-03 DIAGNOSIS — Z7982 Long term (current) use of aspirin: Secondary | ICD-10-CM | POA: Insufficient documentation

## 2015-05-03 DIAGNOSIS — H2512 Age-related nuclear cataract, left eye: Secondary | ICD-10-CM | POA: Insufficient documentation

## 2015-05-03 DIAGNOSIS — H25012 Cortical age-related cataract, left eye: Secondary | ICD-10-CM | POA: Diagnosis not present

## 2015-05-03 DIAGNOSIS — E780 Pure hypercholesterolemia: Secondary | ICD-10-CM | POA: Insufficient documentation

## 2015-05-03 DIAGNOSIS — Z79899 Other long term (current) drug therapy: Secondary | ICD-10-CM | POA: Insufficient documentation

## 2015-05-03 DIAGNOSIS — Z87891 Personal history of nicotine dependence: Secondary | ICD-10-CM | POA: Insufficient documentation

## 2015-05-03 HISTORY — PX: CATARACT EXTRACTION W/PHACO: SHX586

## 2015-05-03 SURGERY — PHACOEMULSIFICATION, CATARACT, WITH IOL INSERTION
Anesthesia: Monitor Anesthesia Care | Site: Eye | Laterality: Left

## 2015-05-03 MED ORDER — EPINEPHRINE HCL 1 MG/ML IJ SOLN
INTRAOCULAR | Status: DC | PRN
  Administered 2015-05-03: 500 mL

## 2015-05-03 MED ORDER — KETOROLAC TROMETHAMINE 0.5 % OP SOLN
1.0000 [drp] | OPHTHALMIC | Status: AC
  Administered 2015-05-03 (×3): 1 [drp] via OPHTHALMIC

## 2015-05-03 MED ORDER — CYCLOPENTOLATE-PHENYLEPHRINE 0.2-1 % OP SOLN
1.0000 [drp] | OPHTHALMIC | Status: AC
  Administered 2015-05-03 (×3): 1 [drp] via OPHTHALMIC

## 2015-05-03 MED ORDER — LACTATED RINGERS IV SOLN
INTRAVENOUS | Status: DC
  Administered 2015-05-03: 09:00:00 via INTRAVENOUS

## 2015-05-03 MED ORDER — PHENYLEPHRINE HCL 2.5 % OP SOLN
1.0000 [drp] | OPHTHALMIC | Status: AC
  Administered 2015-05-03 (×3): 1 [drp] via OPHTHALMIC

## 2015-05-03 MED ORDER — TETRACAINE 0.5 % OP SOLN OPTIME - NO CHARGE
OPHTHALMIC | Status: DC | PRN
  Administered 2015-05-03: 1 [drp] via OPHTHALMIC

## 2015-05-03 MED ORDER — MIDAZOLAM HCL 2 MG/2ML IJ SOLN
1.0000 mg | INTRAMUSCULAR | Status: DC | PRN
  Administered 2015-05-03: 2 mg via INTRAVENOUS

## 2015-05-03 MED ORDER — MIDAZOLAM HCL 2 MG/2ML IJ SOLN
INTRAMUSCULAR | Status: AC
  Filled 2015-05-03: qty 2

## 2015-05-03 MED ORDER — BSS IO SOLN
INTRAOCULAR | Status: DC | PRN
  Administered 2015-05-03: 15 mL

## 2015-05-03 MED ORDER — PROVISC 10 MG/ML IO SOLN
INTRAOCULAR | Status: DC | PRN
  Administered 2015-05-03: 0.85 mL via INTRAOCULAR

## 2015-05-03 MED ORDER — FENTANYL CITRATE (PF) 100 MCG/2ML IJ SOLN
INTRAMUSCULAR | Status: AC
  Filled 2015-05-03: qty 2

## 2015-05-03 MED ORDER — FENTANYL CITRATE (PF) 100 MCG/2ML IJ SOLN
25.0000 ug | INTRAMUSCULAR | Status: AC
  Administered 2015-05-03: 25 ug via INTRAVENOUS

## 2015-05-03 MED ORDER — EPINEPHRINE HCL 1 MG/ML IJ SOLN
INTRAMUSCULAR | Status: AC
  Filled 2015-05-03: qty 1

## 2015-05-03 MED ORDER — TETRACAINE HCL 0.5 % OP SOLN
1.0000 [drp] | OPHTHALMIC | Status: AC
  Administered 2015-05-03 (×3): 1 [drp] via OPHTHALMIC

## 2015-05-03 SURGICAL SUPPLY — 10 items

## 2015-05-03 NOTE — Anesthesia Postprocedure Evaluation (Signed)
  Anesthesia Post-op Note  Patient: Peter Lopez  Procedure(s) Performed: Procedure(s) with comments: CATARACT EXTRACTION PHACO AND INTRAOCULAR LENS PLACEMENT LEFT EYE (Left) - CDE:8.03  Patient Location: Short Stay  Anesthesia Type:MAC  Level of Consciousness: awake, alert  and oriented  Airway and Oxygen Therapy: Patient Spontanous Breathing  Post-op Pain: none  Post-op Assessment: Post-op Vital signs reviewed, Patient's Cardiovascular Status Stable, Respiratory Function Stable, Patent Airway and No signs of Nausea or vomiting              Post-op Vital Signs: Reviewed and stable  Last Vitals:  Filed Vitals:   05/03/15 0813  BP: 145/80  Pulse: 58  Temp: 36.4 C  Resp: 20    Complications: No apparent anesthesia complications

## 2015-05-03 NOTE — Transfer of Care (Signed)
Immediate Anesthesia Transfer of Care Note  Patient: Peter Lopez  Procedure(s) Performed: Procedure(s) with comments: CATARACT EXTRACTION PHACO AND INTRAOCULAR LENS PLACEMENT LEFT EYE (Left) - CDE:8.03  Patient Location: PACU  Anesthesia Type:MAC  Level of Consciousness: awake  Airway & Oxygen Therapy: Patient Spontanous Breathing  Post-op Assessment: Report given to RN  Post vital signs: Reviewed  Last Vitals:  Filed Vitals:   05/03/15 0813  BP: 145/80  Pulse: 58  Temp: 36.4 C  Resp: 20    Complications: No apparent anesthesia complications

## 2015-05-03 NOTE — H&P (Signed)
The patient was re examined and there is no change in the patients condition since the original H and P. 

## 2015-05-03 NOTE — Op Note (Signed)
Patient brought to the operating room and prepped and draped in the usual manner. Lid speculum inserted in left eye. Stab incision made at the twelve o'clock position. Provisc instilled in the anterior chamber. A 2.4 mm. Stab incision was made temporally. An anterior capsulotomy was done with a bent 25 gauge needle. The nucleus was hydrodissected. The Phaco tip was inserted in the anterior chamber and the nucleus was emulsified. CDE was 8.03. The cortical material was then removed with the I and A tip. Posterior capsule was the polished. The anterior chamber was deepened with Provisc. A 20.0 Diopter Alcon [id number] IOL was then inserted in the capsular bag. Provisc was then removed with the I and A tip. The wound was then hydrated. Patient sent to the Recovery Room in good condition with follow up in my office.  Preoperative Diagnosis: Cortical and Nuclear Cataract OS  Postoperative Diagnosis: Same  Procedure name: Kelman Phacoemulsification OS with IOL

## 2015-05-03 NOTE — Discharge Instructions (Signed)
Peter Lopez  05/03/2015           [facility] [address]      1. Avoid closing eyes tightly. One often closes the eye tightly when laughing, talking, sneezing, coughing or if they feel irritated. At these times, you should be careful not to close your eyes tightly.  2. Instill eye drops as instructed. To instill drops in your eye, open it, look up and have someone gently pull the lower lid down and instill a couple of drops inside the lower lid.  3. Do not touch upper lid.  4. Take Advil or Tylenol for pain.  5. You may use either eye for near work, such as reading or sewing and you may watch television.  6. You may have your hair done at the beauty parlor at any time.  7. Wear dark glasses with or without your own glasses if you are in bright light.  8. Call our office at [phone] or [phone] if you have sharp pain in your eye or unusual symptoms.  9. Do not be concerned because vision in the operative eye is not good. It will not be good, no matter how successful the operation, until you get a special lens for it. Your old glasses will not be suited to the new eye that was operated on and you will not be ready for a new lens for about a month.  10. Follow up at the [facility] office. Between 2-3 PM    I have received a copy of the above instructions and will follow them.      PATIENT INSTRUCTIONS POST-ANESTHESIA  IMMEDIATELY FOLLOWING SURGERY:  Do not drive or operate machinery for the first twenty four hours after surgery.  Do not make any important decisions for twenty four hours after surgery or while taking narcotic pain medications or sedatives.  If you develop intractable nausea and vomiting or a severe headache please notify your doctor immediately.  FOLLOW-UP:  Please make an appointment with your surgeon as instructed. You do not need to follow up with anesthesia unless specifically  instructed to do so.  WOUND CARE INSTRUCTIONS (if applicable):  Keep a dry clean dressing on the anesthesia/puncture wound site if there is drainage.  Once the wound has quit draining you may leave it open to air.  Generally you should leave the bandage intact for twenty four hours unless there is drainage.  If the epidural site drains for more than 36-48 hours please call the anesthesia department.  QUESTIONS?:  Please feel free to call your physician or the hospital operator if you have any questions, and they will be happy to assist you.

## 2015-05-03 NOTE — Anesthesia Preprocedure Evaluation (Signed)
Anesthesia Evaluation  Patient identified by MRN, date of birth, ID band Patient awake    Reviewed: Allergy & Precautions, NPO status , Patient's Chart, lab work & pertinent test results, reviewed documented beta blocker date and time   Airway Mallampati: II  TM Distance: >3 FB     Dental  (+) Teeth Intact   Pulmonary shortness of breath and with exertion, former smoker,    breath sounds clear to auscultation       Cardiovascular hypertension, Pt. on home beta blockers and Pt. on medications + DOE  + dysrhythmias + pacemaker  Rhythm:Regular Rate:Normal     Neuro/Psych    GI/Hepatic negative GI ROS,   Endo/Other    Renal/GU      Musculoskeletal   Abdominal   Peds  Hematology   Anesthesia Other Findings   Reproductive/Obstetrics                             Anesthesia Physical Anesthesia Plan  ASA: III  Anesthesia Plan: MAC   Post-op Pain Management:    Induction: Intravenous  Airway Management Planned: Nasal Cannula  Additional Equipment:   Intra-op Plan:   Post-operative Plan:   Informed Consent: I have reviewed the patients History and Physical, chart, labs and discussed the procedure including the risks, benefits and alternatives for the proposed anesthesia with the patient or authorized representative who has indicated his/her understanding and acceptance.     Plan Discussed with:   Anesthesia Plan Comments:         Anesthesia Quick Evaluation

## 2015-05-04 ENCOUNTER — Encounter (HOSPITAL_COMMUNITY): Payer: Self-pay | Admitting: Ophthalmology

## 2015-05-05 LAB — CUP PACEART REMOTE DEVICE CHECK
Battery Impedance: 427 Ohm
Brady Statistic RV Percent Paced: 98.7 %
Date Time Interrogation Session: 20160915162855
Lead Channel Setting Pacing Pulse Width: 0.4 ms
MDC IDC MSMT BATTERY VOLTAGE: 2.78 V
MDC IDC MSMT LEADCHNL RV IMPEDANCE VALUE: 706 Ohm
MDC IDC MSMT LEADCHNL RV PACING THRESHOLD AMPLITUDE: 0.75 V
MDC IDC MSMT LEADCHNL RV PACING THRESHOLD PULSEWIDTH: 0.4 ms
MDC IDC SET LEADCHNL RV PACING AMPLITUDE: 2.5 V
MDC IDC SET LEADCHNL RV SENSING SENSITIVITY: 2.8 mV

## 2015-05-20 ENCOUNTER — Encounter: Payer: Self-pay | Admitting: Cardiology

## 2015-06-01 ENCOUNTER — Encounter: Payer: Self-pay | Admitting: Internal Medicine

## 2015-07-27 ENCOUNTER — Ambulatory Visit (INDEPENDENT_AMBULATORY_CARE_PROVIDER_SITE_OTHER): Payer: PPO | Admitting: *Deleted

## 2015-07-27 ENCOUNTER — Telehealth: Payer: Self-pay | Admitting: Cardiology

## 2015-07-27 DIAGNOSIS — I495 Sick sinus syndrome: Secondary | ICD-10-CM | POA: Diagnosis not present

## 2015-07-27 NOTE — Telephone Encounter (Signed)
LMOVM reminding pt to send remote transmission.   

## 2015-07-28 NOTE — Progress Notes (Signed)
Remote pacemaker transmission.   

## 2015-08-04 LAB — CUP PACEART REMOTE DEVICE CHECK
Battery Impedance: 477 Ohm
Battery Remaining Longevity: 83 mo
Battery Voltage: 2.78 V
Brady Statistic RV Percent Paced: 99 %
Implantable Lead Implant Date: 20140407
Implantable Lead Location: 753860
Lead Channel Impedance Value: 0 Ohm
Lead Channel Setting Pacing Amplitude: 2.5 V
Lead Channel Setting Pacing Pulse Width: 0.4 ms
Lead Channel Setting Sensing Sensitivity: 4 mV
MDC IDC MSMT LEADCHNL RV IMPEDANCE VALUE: 646 Ohm
MDC IDC MSMT LEADCHNL RV PACING THRESHOLD AMPLITUDE: 0.75 V
MDC IDC MSMT LEADCHNL RV PACING THRESHOLD PULSEWIDTH: 0.4 ms
MDC IDC SESS DTM: 20161208013725

## 2015-08-10 ENCOUNTER — Encounter: Payer: Self-pay | Admitting: Cardiology

## 2015-09-06 DIAGNOSIS — Z961 Presence of intraocular lens: Secondary | ICD-10-CM | POA: Diagnosis not present

## 2015-11-11 DIAGNOSIS — R7301 Impaired fasting glucose: Secondary | ICD-10-CM | POA: Diagnosis not present

## 2015-11-11 DIAGNOSIS — R259 Unspecified abnormal involuntary movements: Secondary | ICD-10-CM | POA: Diagnosis not present

## 2015-11-11 DIAGNOSIS — I4891 Unspecified atrial fibrillation: Secondary | ICD-10-CM | POA: Diagnosis not present

## 2015-11-11 DIAGNOSIS — N401 Enlarged prostate with lower urinary tract symptoms: Secondary | ICD-10-CM | POA: Diagnosis not present

## 2015-11-11 DIAGNOSIS — J449 Chronic obstructive pulmonary disease, unspecified: Secondary | ICD-10-CM | POA: Diagnosis not present

## 2015-11-11 DIAGNOSIS — I10 Essential (primary) hypertension: Secondary | ICD-10-CM | POA: Diagnosis not present

## 2015-11-11 DIAGNOSIS — E785 Hyperlipidemia, unspecified: Secondary | ICD-10-CM | POA: Diagnosis not present

## 2015-12-05 DIAGNOSIS — M810 Age-related osteoporosis without current pathological fracture: Secondary | ICD-10-CM | POA: Diagnosis not present

## 2015-12-05 DIAGNOSIS — C61 Malignant neoplasm of prostate: Secondary | ICD-10-CM | POA: Diagnosis not present

## 2015-12-07 ENCOUNTER — Encounter: Payer: Self-pay | Admitting: Internal Medicine

## 2015-12-07 ENCOUNTER — Ambulatory Visit (INDEPENDENT_AMBULATORY_CARE_PROVIDER_SITE_OTHER): Payer: PPO | Admitting: Internal Medicine

## 2015-12-07 VITALS — BP 148/78 | HR 70 | Ht 69.0 in | Wt 140.0 lb

## 2015-12-07 DIAGNOSIS — I4891 Unspecified atrial fibrillation: Secondary | ICD-10-CM

## 2015-12-07 LAB — CUP PACEART INCLINIC DEVICE CHECK
Battery Impedance: 627 Ohm
Date Time Interrogation Session: 20170428103321
Implantable Lead Implant Date: 20140407
Implantable Lead Location: 753860
Lead Channel Pacing Threshold Pulse Width: 0.4 ms
Lead Channel Setting Pacing Pulse Width: 0.4 ms
Lead Channel Setting Sensing Sensitivity: 4 mV
MDC IDC MSMT BATTERY REMAINING LONGEVITY: 72 mo
MDC IDC MSMT BATTERY VOLTAGE: 2.77 V
MDC IDC MSMT LEADCHNL RV IMPEDANCE VALUE: 639 Ohm
MDC IDC MSMT LEADCHNL RV PACING THRESHOLD AMPLITUDE: 0.75 V
MDC IDC SET LEADCHNL RV PACING AMPLITUDE: 2.5 V
MDC IDC STAT BRADY RV PERCENT PACED: 98.7 %

## 2015-12-07 NOTE — Progress Notes (Signed)
HPI Peter Lopez returns today for followup. He is a very pleasant 80 year old man with symptomatic bradycardia, status post permanent pacemaker insertion, chronic atrial fibrillation, and chronic anticoagulation. The patient noted that on systemic anticoagulation.  He denies peripheral edema, chest pain, or shortness of breath. He has lost about 20 lbs in the past few months. He denies cough or hemoptysis. He states that his appetite is down and he does not want to eat as much.  Allergies  Allergen Reactions  . Rivaroxaban     Other reaction(s): Other (See Comments) Coughing up blood     Current Outpatient Prescriptions  Medication Sig Dispense Refill  . amLODipine-olmesartan (AZOR) 5-40 MG per tablet Take 1 tablet by mouth daily.    Marland Kitchen aspirin 325 MG tablet Take 325 mg by mouth daily.    . Calcium Citrate-Vitamin D (CALCIUM + D PO) Take 1,200 mg by mouth daily.    . folic acid (FOLVITE) 015 MCG tablet Take 400 mcg by mouth daily.     . methotrexate (RHEUMATREX) 2.5 MG tablet Take 5 mg by mouth once a week. 'sunday    . propranolol (INDERAL) 40 MG tablet Take 40 mg by mouth.     . simvastatin (ZOCOR) 40 MG tablet Take 40 mg by mouth daily.       No current facility-administered medications for this visit.     Past Medical History  Diagnosis Date  . Exertional dyspnea   . 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 Hypertension   . Hyperlipidemia   . Benign essential tremor   . Tobacco abuse     30'$  pack years; discontinued in 2000; currently one pack per week  . Prostate cancer ([place])     S/P Prostatectomy  . Pacemaker 11/24/2012    Dr Lovena Le    ROS:   All systems reviewed and negative except as noted in the HPI.   Past Surgical History  Procedure Laterality Date  . Meniscectomy      Right knee open meniscectomy  . Knee surgery      Left laparoscopic knee surgery  . Robot assisted laparoscopic radical prostatectomy    . Appendectomy    . Colonoscopy  2011  . Hernia repair     . Insert / replace / remove pacemaker  11/24/2012    Dr Cristopher Peru  . Permanent pacemaker insertion N/A 11/24/2012    Procedure: PERMANENT PACEMAKER INSERTION;  Surgeon: Evans Lance, MD;  Location: [facility] CATH LAB;  Service: Cardiovascular;  Laterality: N/A;  . Cataract extraction w/phaco Left 05/03/2015    Procedure: CATARACT EXTRACTION PHACO AND INTRAOCULAR LENS PLACEMENT LEFT EYE;  Surgeon: Rutherford Guys, MD;  Location: [facility] ORS;  Service: Ophthalmology;  Laterality: Left;  CDE:8.03     Family History  Problem Relation Age of Onset  . Alzheimer's disease Mother 81  . Early death Father 67    trauma  . Heart failure Brother      Social History   Social History  . Marital Status: Married    Spouse Name: N/A  . Number of Children: N/A  . Years of Education: N/A   Occupational History  . Runner, broadcasting/film/video    Social History Main Topics  . Smoking status: Former Smoker -- 1.00 packs/day    Quit date: 12/11/1998  . Smokeless tobacco: Never Used  . Alcohol Use: 0.0 oz/week    0 Standard drinks or equivalent per week     Comment: modest  .  Drug Use: No  . Sexual Activity: Not on file   Other Topics Concern  . Not on file   Social History Narrative     BP 152/80, P 78, R - 18 Physical Exam:  Well appearing 80 year old man,NAD HEENT: Unremarkable Neck:  6 cm JVD, no thyromegally Lungs:  Clear with no wheezes, rales, or rhonchi. HEART:  IRegular rate rhythm, no murmurs, no rubs, no clicks Abd:  soft, positive bowel sounds, no organomegally, no rebound, no guarding Ext:  2 plus pulses, no edema, no cyanosis, no clubbing Skin:  No rashes no nodules Neuro:  CN II through XII intact, motor grossly intact  DEVICE  Normal device function.  See PaceArt for details.   Assess/Plan: 1. Chronic diastolic heart failure - his symptoms are well controlled. I asked him to reduce his sodium intake. 2. Atrial fib - his ventricular rate is well controlled. 3. PPM - her Medtronic  VVI PM is working normally. Will follow.  Peter Lopez.D.

## 2015-12-07 NOTE — Patient Instructions (Signed)
Your physician wants you to follow-up in: 1 Year with Dr. Lovena Le. You will receive a reminder letter in the mail two months in advance. If you don't receive a letter, please call our office to schedule the follow-up appointment.  Remote monitoring is used to monitor your Pacemaker of ICD from home. This monitoring reduces the number of office visits required to check your device to one time per year. It allows [place] to keep an eye on the functioning of your device to ensure it is working properly. You are scheduled for a device check from home on 03/06/16. You may send your transmission at any time that day. If you have a wireless device, the transmission will be sent automatically. After your physician reviews your transmission, you will receive a postcard with your next transmission date.  If you need a refill on your cardiac medications before your next appointment, please call your pharmacy.  Thank you for choosing [facility] [place] [facility]!

## 2015-12-08 DIAGNOSIS — R259 Unspecified abnormal involuntary movements: Secondary | ICD-10-CM | POA: Diagnosis not present

## 2015-12-08 DIAGNOSIS — I4891 Unspecified atrial fibrillation: Secondary | ICD-10-CM | POA: Diagnosis not present

## 2015-12-08 DIAGNOSIS — J449 Chronic obstructive pulmonary disease, unspecified: Secondary | ICD-10-CM | POA: Diagnosis not present

## 2015-12-08 DIAGNOSIS — I10 Essential (primary) hypertension: Secondary | ICD-10-CM | POA: Diagnosis not present

## 2015-12-21 DIAGNOSIS — M810 Age-related osteoporosis without current pathological fracture: Secondary | ICD-10-CM | POA: Diagnosis not present

## 2015-12-21 DIAGNOSIS — C61 Malignant neoplasm of prostate: Secondary | ICD-10-CM | POA: Diagnosis not present

## 2016-01-04 ENCOUNTER — Ambulatory Visit (INDEPENDENT_AMBULATORY_CARE_PROVIDER_SITE_OTHER): Payer: PPO | Admitting: Orthopedic Surgery

## 2016-01-04 ENCOUNTER — Ambulatory Visit (INDEPENDENT_AMBULATORY_CARE_PROVIDER_SITE_OTHER): Payer: PPO

## 2016-01-04 VITALS — BP 151/80 | Ht 69.0 in | Wt 144.0 lb

## 2016-01-04 DIAGNOSIS — M25531 Pain in right wrist: Secondary | ICD-10-CM

## 2016-01-04 DIAGNOSIS — M189 Osteoarthritis of first carpometacarpal joint, unspecified: Secondary | ICD-10-CM

## 2016-01-04 DIAGNOSIS — M1830 Unilateral post-traumatic osteoarthritis of first carpometacarpal joint, unspecified hand: Secondary | ICD-10-CM

## 2016-01-04 DIAGNOSIS — M654 Radial styloid tenosynovitis [de Quervain]: Secondary | ICD-10-CM

## 2016-01-04 NOTE — Patient Instructions (Signed)
Wear splint for one month  Ice 20 minutes daily  Remove for bathing  If he can tolerate it also where to sleep

## 2016-01-04 NOTE — Progress Notes (Signed)
Chief Complaint  Patient presents with  . Wrist Pain    RIGHT WRIST PAIN, RIGHT THUMB PAIN, DOI APPROX 11/21/15 FALL   HPI 80 years old fell last 7 weeks ago injured his right thumb. Comes in complaining of pain over the right thumb CMC joint first extensor compartment moderately severe unrelieved by wrist splinting pain is constant worse with wrist extension and thumb extension  Review of Systems  Respiratory: Positive for cough.   Cardiovascular: Positive for palpitations. Negative for leg swelling.  Gastrointestinal: Negative for heartburn.  Genitourinary: Negative for dysuria.    Past Medical History  Diagnosis Date  . Exertional dyspnea   . 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 Hypertension   . Hyperlipidemia   . Benign essential tremor   . Tobacco abuse     30 pack years; discontinued in 2000; currently one pack per week  . Prostate cancer ([place])     S/P Prostatectomy  . Pacemaker 11/24/2012    Dr Lovena Le    Past Surgical History  Procedure Laterality Date  . Meniscectomy      Right knee open meniscectomy  . Knee surgery      Left laparoscopic knee surgery  . Robot assisted laparoscopic radical prostatectomy    . Appendectomy    . Colonoscopy  2011  . Hernia repair    . Insert / replace / remove pacemaker  11/24/2012    Dr Cristopher Peru  . Permanent pacemaker insertion N/A 11/24/2012    Procedure: PERMANENT PACEMAKER INSERTION;  Surgeon: Evans Lance, MD;  Location: [facility] CATH LAB;  Service: Cardiovascular;  Laterality: N/A;  . Cataract extraction w/phaco Left 05/03/2015    Procedure: CATARACT EXTRACTION PHACO AND INTRAOCULAR LENS PLACEMENT LEFT EYE;  Surgeon: Rutherford Guys, MD;  Location: [facility] ORS;  Service: Ophthalmology;  Laterality: Left;  CDE:8.03   Family History  Problem Relation Age of Onset  . Alzheimer's disease Mother 60  . Early death Father 35    trauma  . Heart failure Brother    Social History  Substance Use Topics  . Smoking status: Former Smoker --  1.00 packs/day    Quit date: 12/11/1998  . Smokeless tobacco: Never Used  . Alcohol Use: 0.0 oz/week    0 Standard drinks or equivalent per week     Comment: modest    Current outpatient prescriptions:  .  amLODipine-olmesartan (AZOR) 5-40 MG per tablet, Take 1 tablet by mouth daily., Disp: , Rfl:  .  aspirin 325 MG tablet, Take 325 mg by mouth daily., Disp: , Rfl:  .  Calcium Citrate-Vitamin D (CALCIUM + D PO), Take 1,200 mg by mouth daily., Disp: , Rfl:  .  folic acid (FOLVITE) 469 MCG tablet, Take 400 mcg by mouth daily. , Disp: , Rfl:  .  methotrexate (RHEUMATREX) 2.5 MG tablet, Take 5 mg by mouth once a week. sunday, Disp: , Rfl:  .  propranolol (INDERAL) 40 MG tablet, Take 40 mg by mouth. , Disp: , Rfl:  .  simvastatin (ZOCOR) 40 MG tablet, Take 40 mg by mouth daily.  , Disp: , Rfl:   BP 151/80 mmHg  Ht '5\' 9"'$  (1.753 m)  Wt 144 lb (65.318 kg)  BMI 21.26 kg/m2  Physical Exam  Constitutional: He is oriented to person, place, and time. He appears well-developed and well-nourished. No distress.  Cardiovascular: Normal rate and intact distal pulses.   Neurological: He is alert and oriented to person, place, and time.  Skin: Skin is warm and dry. No rash noted. He is not diaphoretic. No erythema. No pallor.  Psychiatric: He has a normal mood and affect. His behavior is normal. Judgment and thought content normal.    Ortho Exam No problems today with his ambulation and gait. His thumb is tender in the [facility] joint and first extensor compartment has painful extension and ulnar deviation of the wrist flexion extension is normal. Wrist is stable CMC joint subluxation. He has normal EPL strength and deep EPB strength. Skin shows some ecchymosis which is chronic. No erythema is noted. Normal color temperature and capillary refill. Epitrochlear lymph nodes normal. Sensation in the thumb is normal.  The left thumb is nontender and has normal range of motion  ASSESSMENT: My personal  interpretation of the images:  Images in the office today show mild arthritis of the [facility] joint some impingement of the radial styloid and scaphoid. No fracture  PLAN  Recommend thumb spica splint for one month and follow-up repeat exam

## 2016-02-02 ENCOUNTER — Ambulatory Visit (INDEPENDENT_AMBULATORY_CARE_PROVIDER_SITE_OTHER): Payer: PPO | Admitting: Orthopedic Surgery

## 2016-02-02 VITALS — BP 141/79 | HR 73 | Ht 69.0 in | Wt 149.0 lb

## 2016-02-02 DIAGNOSIS — M654 Radial styloid tenosynovitis [de Quervain]: Secondary | ICD-10-CM

## 2016-02-02 NOTE — Progress Notes (Signed)
Chief Complaint  Patient presents with  . Follow-up    Right thumb de Quervains    BP 141/79 mmHg  Pulse 73  Ht '5\' 9"'$  (1.753 m)  Wt 149 lb (67.586 kg)  BMI 21.99 kg/m2  Encounter Diagnosis  Name Primary?  Tennis Must Quervain's syndrome (tenosynovitis) Yes    Follow-up de Quervain's syndrome patient wore splint did well no pain full range of motion negative Finkelstein's test  Allowed to follow-up as needed released

## 2016-02-14 ENCOUNTER — Other Ambulatory Visit (HOSPITAL_COMMUNITY)
Admission: RE | Admit: 2016-02-14 | Discharge: 2016-02-14 | Disposition: A | Discharge status: Home / Self Care | Payer: PPO | Source: Ambulatory Visit | Attending: Adult Health | Admitting: Adult Health

## 2016-02-14 ENCOUNTER — Encounter: Payer: Self-pay | Admitting: Adult Health

## 2016-02-14 ENCOUNTER — Telehealth: Payer: Self-pay | Admitting: Internal Medicine

## 2016-02-14 ENCOUNTER — Ambulatory Visit (INDEPENDENT_AMBULATORY_CARE_PROVIDER_SITE_OTHER): Payer: PPO | Admitting: Adult Health

## 2016-02-14 VITALS — BP 126/82 | HR 77 | Ht 69.0 in | Wt 156.6 lb

## 2016-02-14 DIAGNOSIS — I5031 Acute diastolic (congestive) heart failure: Secondary | ICD-10-CM | POA: Diagnosis not present

## 2016-02-14 DIAGNOSIS — I4891 Unspecified atrial fibrillation: Secondary | ICD-10-CM

## 2016-02-14 DIAGNOSIS — I10 Essential (primary) hypertension: Secondary | ICD-10-CM

## 2016-02-14 LAB — COMPREHENSIVE METABOLIC PANEL
ALK PHOS: 191 U/L — AB (ref 38–126)
ALT: 18 U/L (ref 17–63)
ANION GAP: 7 (ref 5–15)
AST: 31 U/L (ref 15–41)
Albumin: 4 g/dL (ref 3.5–5.0)
BUN: 20 mg/dL (ref 6–20)
CALCIUM: 8.5 mg/dL — AB (ref 8.9–10.3)
CO2: 26 mmol/L (ref 22–32)
CREATININE: 0.7 mg/dL (ref 0.61–1.24)
Chloride: 96 mmol/L — ABNORMAL LOW (ref 101–111)
Glucose, Bld: 103 mg/dL — ABNORMAL HIGH (ref 65–99)
Potassium: 3.9 mmol/L (ref 3.5–5.1)
Sodium: 129 mmol/L — ABNORMAL LOW (ref 135–145)
Total Bilirubin: 1.7 mg/dL — ABNORMAL HIGH (ref 0.3–1.2)
Total Protein: 6.6 g/dL (ref 6.5–8.1)

## 2016-02-14 LAB — CBC WITH DIFFERENTIAL/PLATELET
Basophils Absolute: 0 10*3/uL (ref 0.0–0.1)
Basophils Relative: 0 %
Eosinophils Absolute: 0.1 10*3/uL (ref 0.0–0.7)
Eosinophils Relative: 1 %
HCT: 46.4 % (ref 39.0–52.0)
Hemoglobin: 15.6 g/dL (ref 13.0–17.0)
Lymphocytes Relative: 23 %
Lymphs Abs: 1.6 10*3/uL (ref 0.7–4.0)
MCH: 32.4 pg (ref 26.0–34.0)
MCHC: 33.6 g/dL (ref 30.0–36.0)
MCV: 96.3 fL (ref 78.0–100.0)
Monocytes Absolute: 1.2 10*3/uL — ABNORMAL HIGH (ref 0.1–1.0)
Monocytes Relative: 18 %
Neutro Abs: 3.9 10*3/uL (ref 1.7–7.7)
Neutrophils Relative %: 58 %
Platelets: 166 10*3/uL (ref 150–400)
RBC: 4.82 MIL/uL (ref 4.22–5.81)
RDW: 14.4 % (ref 11.5–15.5)
WBC: 6.8 10*3/uL (ref 4.0–10.5)

## 2016-02-14 MED ORDER — POTASSIUM CHLORIDE CRYS ER 20 MEQ PO TBCR: 40.0000 meq | EXTENDED_RELEASE_TABLET | Freq: Every day | ORAL | Status: DC

## 2016-02-14 MED ORDER — TORSEMIDE 20 MG PO TABS
40.0000 mg | ORAL_TABLET | Freq: Two times a day (BID) | ORAL | Status: DC
Start: 2016-02-14 — End: 2016-02-16

## 2016-02-14 NOTE — Progress Notes (Signed)
Cardiology Office Note   Date:  02/14/2016   ID:  Peter Lopez, DOB 1935-08-22, MRN [id number]  PCP:  Alonza Bogus, MD  Cardiologist:  Bryna Colander, NP   Chief Complaint  Patient presents with  . Edema  . Shortness of Breath      History of Present Illness: Peter Lopez is a 80 y.o. male who presents for ongoing assessment and management of symptomatic bradycardia, status post permanent pacemaker insertion, history of chronic atrial fibrillation not on chronic anticoagulation. He was last seen by Dr. Lovena Le on 12/07/2015 with pacemaker interrogation.he was found to be functioning appropriately.he was counseled on a low sodium diet. There is no evidence of edema.  The patient called our office today stating that he was having lower extremity edema and is requested to be seen.  He has gained 16 lbs in 2 months, with 7 lbs within the last week. He denies excessive salt intake. He is having PND and orthopnea. Edema of his lower abdomen, and LEE below the knees. He is having worsening DOE out of proportion to COPD. He continues to drink ETOH twice a day.   Echocardiogram 2014 Left ventricle: The cavity size was normal. Wall thickness was normal. Systolic function was normal. The estimated ejection fraction was 60%. Wall motion was normal; there were no regional wall motion abnormalities. - Ventricular septum: The contour showed mild systolic flattening. - Aortic valve: Mildly calcified annulus. Trileaflet; mildly thickened leaflets. - Left atrium: The atrium was moderately dilated. - Right ventricle: The cavity size was mildly to moderately dilated. Wall thickness was mildly increased. Systolic function was mildly reduced. - Right atrium: The atrium was moderately dilated. - Atrial septum: No defect or patent foramen ovale was identified. - Pulmonary arteries: Systolic pressure was mildly increased. PA peak pressure: 81m Hg (S).  Past Medical  History  Diagnosis Date  . Exertional dyspnea   . 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 Hypertension   . Hyperlipidemia   . Benign essential tremor   . Tobacco abuse     30 pack years; discontinued in 2000; currently one pack per week  [place]     S/P Prostatectomy  . Pacemaker 11/24/2012    Dr TLovena Le   Past Surgical History  Procedure Laterality Date  . Meniscectomy      Right knee open meniscectomy  . Knee surgery      Left laparoscopic knee surgery  . Robot assisted laparoscopic radical prostatectomy    . Appendectomy    . Colonoscopy  2011  . Hernia repair    . Insert / replace / remove pacemaker  11/24/2012    Dr GCristopher Peru . Permanent pacemaker insertion N/A 11/24/2012    Procedure: PERMANENT PACEMAKER INSERTION;  Surgeon: GEvans Lance MD;  [facility];  Service: Cardiovascular;  Laterality: N/A;  . Cataract extraction w/phaco Left 05/03/2015    Procedure: CATARACT EXTRACTION PHACO AND INTRAOCULAR LENS PLACEMENT LEFT EYE;  Surgeon: MRutherford Guys MD;  Location: [facility]S;  Service: Ophthalmology;  Laterality: Left;  CDE:8.03     Current Outpatient Prescriptions  Medication Sig Dispense Refill  . amLODipine-olmesartan (AZOR) 5-40 MG per tablet Take 1 tablet by mouth daily.    .Marland Kitchenaspirin 325 MG tablet Take 325 mg by mouth daily.    . Calcium Citrate-Vitamin D (CALCIUM + D PO) Take 1,200 mg by mouth daily.    . folic acid (FOLVITE) 4284MCG  tablet Take 400 mcg by mouth daily.     . methotrexate (RHEUMATREX) 2.5 MG tablet Take 5 mg by mouth once a week. sunday    . propranolol (INDERAL) 40 MG tablet Take 40 mg by mouth.     . simvastatin (ZOCOR) 40 MG tablet Take 40 mg by mouth daily.       No current facility-administered medications for this visit.    Allergies:   Rivaroxaban    Social History:  The patient  reports that he quit smoking about 17 years ago. He has never used smokeless tobacco. He reports that he drinks alcohol. He reports  that he does not use illicit drugs.   Family History:  The patient's family history includes Alzheimer's disease (age of onset: 50) in his mother; Early death (age of onset: 33) in his father; Heart failure in his brother.    ROS: All other systems are reviewed and negative. Unless otherwise mentioned in H&P    PHYSICAL EXAM: VS:  BP 126/82 mmHg  Pulse 77  Ht '5\' 9"'$  (1.753 m)  Wt 156 lb 9.6 oz (71.033 kg)  BMI 23.12 kg/m2  SpO2 92% , BMI Body mass index is 23.12 kg/(m^2). GEN: Well nourished, well developed, in no acute distress HEENT: normal Neck: no JVD, carotid bruits, or masses Cardiac: RRR distant heart sounds; no murmurs, rubs, or gallops Respiratory: Bilateral crackles without wheezes GI: soft, nontender, nondistended, + BS Lower abdomen is mildly distended. He has a ventral hernia.  MS: no deformity or atrophy2+-3+ pretibial and feet edema. Bilaterally.  Skin: warm and dry, no rash Neuro:  Strength and sensation are intact Psych: euthymic mood, full affect  Recent Labs: 04/28/2015: BUN 19; Creatinine, Ser 0.56*; Hemoglobin 15.7; Platelets 207; Potassium 4.3; Sodium 135    Lipid Panel    Component Value Date/Time   CHOL 163 12/07/2011 1333   TRIG 100 12/07/2011 1333   HDL 57 12/07/2011 1333   CHOLHDL 2.9 12/07/2011 1333   VLDL 20 12/07/2011 1333   LDLCALC 86 12/07/2011 1333      Wt Readings from Last 3 Encounters:  02/14/16 156 lb 9.6 oz (71.033 kg)  02/02/16 149 lb (67.586 kg)  01/04/16 144 lb (65.318 kg)     ASSESSMENT AND PLAN:  1. Acute diastolic CHF: I will begin him on toresemide 40 mg BID for two days, with potassium 28mq daily. He will have labs completed to include CMET, CBC, and BNP. Echocardiogram will be completed for comparison of LV fx from 2014.  He will see me in 2 days. If he is not diuresing or symptoms worsen he is to be admitted for IV diureses. I have explained this to him and to his wife who verbalize understanding. [facility]   2. Atrial Fibrillation: Heart rate is well controlled. He will continue propanolol as directed. He is not on anticoagulation.   3. Hypertension: BP is normal despite his fluid retention. Will see him on close follow up.    Current medicines are reviewed at length with the patient today.    Labs/ tests ordered today include: CMET, CBC, BNP, Echocardiogram.  No orders of the defined types were placed in this encounter.     Disposition:   FU with 2 days.   Signed, KJory Sims NP  02/14/2016 1:33 PM    [place] [place]  [address]Phone: [phone] Fax: [phone]

## 2016-02-14 NOTE — Progress Notes (Signed)
Name: Peter Lopez    DOB: November 29, 1935  Age: 80 y.o.  MR#: [id number]       PCP:  Alonza Bogus, MD      Insurance: Payor: Tennis Must / Plan: Tennis Must / Product Type: *No Product type* /   CC:   No chief complaint on file.   VS Filed Vitals:   02/14/16 1308  BP: 126/82  Pulse: 77  Height: '5\' 9"'$  (1.753 m)  Weight: 156 lb 9.6 oz (71.033 kg)  SpO2: 92%    Weights Current Weight  02/14/16 156 lb 9.6 oz (71.033 kg)  02/02/16 149 lb (67.586 kg)  01/04/16 144 lb (65.318 kg)    Blood Pressure  BP Readings from Last 3 Encounters:  02/14/16 126/82  02/02/16 141/79  01/04/16 151/80     Admit date:  (Not on file) Last encounter with RMR:  Visit date not found   Allergy Rivaroxaban  Current Outpatient Prescriptions  Medication Sig Dispense Refill  . amLODipine-olmesartan (AZOR) 5-40 MG per tablet Take 1 tablet by mouth daily.    Marland Kitchen aspirin 325 MG tablet Take 325 mg by mouth daily.    . Calcium Citrate-Vitamin D (CALCIUM + D PO) Take 1,200 mg by mouth daily.    . folic acid (FOLVITE) 093 MCG tablet Take 400 mcg by mouth daily.     . methotrexate (RHEUMATREX) 2.5 MG tablet Take 5 mg by mouth once a week. sunday    . propranolol (INDERAL) 40 MG tablet Take 40 mg by mouth.     . simvastatin (ZOCOR) 40 MG tablet Take 40 mg by mouth daily.       No current facility-administered medications for this visit.    Discontinued Meds:   There are no discontinued medications.  Patient Active Problem List   Diagnosis Date Noted  . Weight loss 10/22/2014  . Pacemaker 02/18/2013  . Bronchitis 02/18/2013  . Fractured pelvis ([place]) 02-20-2013  . Atrial fibrillation, new onset ([place]) 09/29/2012  . Syncope 09/29/2012  . ETOH abuse 09/29/2012  . Hypertension 10/26/2010  . ADENOCARCINOMA, PROSTATE 05/05/2010  . Hyperlipidemia 05/05/2010  . TOBACCO ABUSE 05/05/2010  . TREMOR, ESSENTIAL 05/05/2010  . Sick sinus syndrome ([facility]) 05/05/2010    LABS    Component Value  Date/Time   NA 135 04/28/2015 1320   NA 138 11/11/2012 1531   NA 135 09/26/2012 1344   K 4.3 04/28/2015 1320   K 4.6 11/11/2012 1531   K 3.4* 09/26/2012 1344   CL 103 04/28/2015 1320   CL 103 11/11/2012 1531   CL 97 09/26/2012 1344   CO2 23 04/28/2015 1320   CO2 28 11/11/2012 1531   CO2 24 09/26/2012 1344   GLUCOSE 102* 04/28/2015 1320   GLUCOSE 98 11/11/2012 1531   GLUCOSE 185* 09/26/2012 1344   BUN 19 04/28/2015 1320   BUN 16 11/11/2012 1531   BUN 16 09/26/2012 1344   CREATININE 0.56* 04/28/2015 1320   CREATININE 0.64 11/24/2012 0936   CREATININE 1.0 11/11/2012 1531   CALCIUM 9.1 04/28/2015 1320   CALCIUM 9.6 11/11/2012 1531   CALCIUM 9.4 09/26/2012 1344   GFRNONAA >60 04/28/2015 1320   GFRNONAA >90 11/24/2012 0936   GFRNONAA >90 09/26/2012 1344   GFRAA >60 04/28/2015 1320   GFRAA >90 11/24/2012 0936   GFRAA >90 09/26/2012 1344   CMP     Component Value Date/Time   NA 135 04/28/2015 1320   K 4.3 04/28/2015 1320   CL 103 04/28/2015 1320  CO2 23 04/28/2015 1320   GLUCOSE 102* 04/28/2015 1320   BUN 19 04/28/2015 1320   CREATININE 0.56* 04/28/2015 1320   CALCIUM 9.1 04/28/2015 1320   PROT 7.3 09/26/2012 1344   ALBUMIN 4.0 09/26/2012 1344   AST 30 09/26/2012 1344   ALT 28 09/26/2012 1344   ALKPHOS 174* 09/26/2012 1344   BILITOT 3.4* 09/26/2012 1344   GFRNONAA >60 04/28/2015 1320   GFRAA >60 04/28/2015 1320       Component Value Date/Time   WBC 7.6 04/28/2015 1320   WBC 7.4 11/24/2012 0936   WBC 9.6 11/11/2012 1531   HGB 15.7 04/28/2015 1320   HGB 14.3 11/24/2012 0936   HGB 15.7 11/11/2012 1531   HCT 47.4 04/28/2015 1320   HCT 40.7 11/24/2012 0936   HCT 46.2 11/11/2012 1531   MCV 97.7 04/28/2015 1320   MCV 89.1 11/24/2012 0936   MCV 91.7 11/11/2012 1531    Lipid Panel     Component Value Date/Time   CHOL 163 12/07/2011 1333   TRIG 100 12/07/2011 1333   HDL 57 12/07/2011 1333   CHOLHDL 2.9 12/07/2011 1333   VLDL 20 12/07/2011 1333   LDLCALC 86  12/07/2011 1333    ABG    Component Value Date/Time   PHART 7.428 05/03/2010 1015   PCO2ART 36.2 05/03/2010 1015   PO2ART 83.7 05/03/2010 1015   HCO3 23.5 05/03/2010 1015   TCO2 20.2 05/03/2010 1015   O2SAT 96.4 05/03/2010 1015     No results found for: TSH BNP (last 3 results) No results for input(s): BNP in the last 8760 hours.  ProBNP (last 3 results) No results for input(s): PROBNP in the last 8760 hours.  Cardiac Panel (last 3 results) No results for input(s): CKTOTAL, CKMB, TROPONINI, RELINDX in the last 72 hours.  Iron/TIBC/Ferritin/ %Sat No results found for: IRON, TIBC, FERRITIN, IRONPCTSAT   EKG Orders placed or performed in visit on 12/07/15  . EKG 12-Lead     Prior Assessment and Plan Problem List as of 02/14/2016      Cardiovascular and Mediastinum   Sick sinus syndrome [facility])   Last Assessment & Plan 12/12/2010 Office Visit Written 12/13/2010  9:52 PM by Yehuda Savannah, MD    Patient remains asymptomatic with respect to his conduction system disease.  It appears likely that symptomatic bradycardia will occur at some point at which time pacemaker insertion will be necessary.      Hypertension   Last Assessment & Plan 10/22/2014 Office Visit Written 10/22/2014 11:39 AM by Evans Lance, MD    His blood pressure is elevated. He admits to dietary and medical non-compliance. He is encouraged to reduce his sodium intake.       Atrial fibrillation, new onset [facility])   Last Assessment & Plan 10/05/202015 Office Visit Written 10/05/202015  9:57 AM by Evans Lance, MD    His ventricular rate is well controlled. Will follow. He is not a candidate for any anti-coagulation.        Syncope   Last Assessment & Plan 11/11/2012 Office Visit Written 11/11/2012  3:15 PM by Evans Lance, MD    The patient has syncope, atrial fibrillation, and documented pauses of over 3 seconds on cardiac monitoring. All this occurred in the absence of any AV nodal blocking drugs. I've discussed  the treatment options with the patient. He would be a good candidate for insertion of our lead was pacemaker. This be scheduled in the next couple of weeks.  Respiratory   Bronchitis   Last Assessment & Plan 02/18/2013 Office Visit Written 02/18/2013  8:56 AM by Evans Lance, MD    The patient has bronchitis with blood-tinged sputum. He reports that he has undergone evaluation by Dr. Luan Pulling. I recommended that he continue his anticoagulation regimen as the benefits outweigh the risk. However if his blood-tinged sputum turns into frank hemoptysis, bronchoscopy would be a consideration.        Musculoskeletal and Integument   Fractured pelvis [facility])     Genitourinary   ADENOCARCINOMA, PROSTATE   Last Assessment & Plan 12/07/2011 Office Visit Edited 12/08/2011 10:46 PM by Yehuda Savannah, MD    No evidence for recurrent carcinoma.        Other   Hyperlipidemia   Last Assessment & Plan 12/07/2011 Office Visit Written 12/08/2011 10:41 PM by Yehuda Savannah, MD    Recent lipid profile demonstrates adequate control of hyperlipidemia with current medication, which will be continued.      TOBACCO ABUSE   Last Assessment & Plan 10/22/2014 Office Visit Written 10/22/2014 11:40 AM by Evans Lance, MD    He is encouraged to stop smoking. Will follow.       TREMOR, ESSENTIAL   ETOH abuse   Last Assessment & Plan 10/30/2012 Office Visit Edited 10/30/2012 12:28 PM by Lendon Colonel, NP    The patient diminshes is the amount of alcohol he drinks daily.       Pacemaker   Last Assessment & Plan 10/22/2014 Office Visit Written 10/22/2014 11:39 AM by Evans Lance, MD    His medtronic DDD PM is working normally. Will recheck in several months.      Weight loss   Last Assessment & Plan 10/22/2014 Office Visit Written 10/22/2014 11:38 AM by Evans Lance, MD    The etiology is uncertain. He will undergo CXR as he has a longstanding h/o tobacco abuse. If negative he will followup with medical MD  for additional evaluation.           Imaging: No results found.

## 2016-02-14 NOTE — Patient Instructions (Signed)
Your physician recommends that you schedule a follow-up appointment with Peter Lopez on Thursday.   Your physician has recommended you make the following change in your medication:  Start Taking Potassium 40 mEq Daily Start Taking Torsemide 40 mg Two Times Daily   Your physician recommends that you have lab work done today.  Your physician has requested that you have an echocardiogram. Echocardiography is a painless test that uses sound waves to create images of your heart. It provides your doctor with information about the size and shape of your heart and how well your heart's chambers and valves are working. This procedure takes approximately one hour. There are no restrictions for this procedure.   If you need a refill on your cardiac medications before your next appointment, please call your pharmacy.  Thank you for choosing [facility]!

## 2016-02-14 NOTE — Telephone Encounter (Signed)
per pt phone call-feet/ankle swelling, feels like he's gained about 10lbs

## 2016-02-14 NOTE — Telephone Encounter (Signed)
Apt made today with NP

## 2016-02-15 ENCOUNTER — Telehealth: Payer: Self-pay | Admitting: *Deleted

## 2016-02-15 DIAGNOSIS — Z79899 Other long term (current) drug therapy: Secondary | ICD-10-CM

## 2016-02-15 NOTE — Telephone Encounter (Signed)
-----   Message from Lendon Colonel, NP sent at 02/14/2016  2:42 PM EDT ----- Not found to be anemic. Waiting for BMET

## 2016-02-16 ENCOUNTER — Ambulatory Visit (INDEPENDENT_AMBULATORY_CARE_PROVIDER_SITE_OTHER): Payer: PPO | Admitting: Adult Health

## 2016-02-16 ENCOUNTER — Other Ambulatory Visit (HOSPITAL_COMMUNITY)
Admission: RE | Admit: 2016-02-16 | Discharge: 2016-02-16 | Disposition: A | Discharge status: Home / Self Care | Payer: PPO | Source: Ambulatory Visit | Attending: Adult Health | Admitting: Adult Health

## 2016-02-16 ENCOUNTER — Encounter: Payer: Self-pay | Admitting: Adult Health

## 2016-02-16 ENCOUNTER — Telehealth: Payer: Self-pay

## 2016-02-16 VITALS — BP 116/66 | HR 98 | Ht 68.5 in | Wt 129.0 lb

## 2016-02-16 DIAGNOSIS — Z79899 Other long term (current) drug therapy: Secondary | ICD-10-CM | POA: Diagnosis not present

## 2016-02-16 DIAGNOSIS — I10 Essential (primary) hypertension: Secondary | ICD-10-CM | POA: Diagnosis not present

## 2016-02-16 DIAGNOSIS — I5032 Chronic diastolic (congestive) heart failure: Secondary | ICD-10-CM | POA: Diagnosis not present

## 2016-02-16 LAB — BASIC METABOLIC PANEL
Anion gap: 10 (ref 5–15)
BUN: 15 mg/dL (ref 6–20)
CALCIUM: 9.3 mg/dL (ref 8.9–10.3)
CO2: 38 mmol/L — AB (ref 22–32)
CREATININE: 0.8 mg/dL (ref 0.61–1.24)
Chloride: 85 mmol/L — ABNORMAL LOW (ref 101–111)
GFR calc Af Amer: >60 mL/min (ref >60)
GLUCOSE: 118 mg/dL — AB (ref 65–99)
Potassium: 2.7 mmol/L — CL (ref 3.5–5.1)
Sodium: 133 mmol/L — ABNORMAL LOW (ref 135–145)

## 2016-02-16 MED ORDER — TORSEMIDE 20 MG PO TABS: 20.0000 mg | ORAL_TABLET | Freq: Every day | ORAL | Status: DC

## 2016-02-16 NOTE — Telephone Encounter (Signed)
Critical lab K+ 2.7, per Arnold Long NP, take 40 meq by mouth for the next 3 days, then resume 20 meq potassium daily,pt verbalized understanding

## 2016-02-16 NOTE — Progress Notes (Signed)
Name: Peter Lopez    DOB: 10-04-1935  Age: 80 y.o.  MR#: [id number]       PCP:  Alonza Bogus, MD      Insurance: Payor: Tennis Must / Plan: Tennis Must / Product Type: *No Product type* /   CC:    Chief Complaint  Patient presents with  . Congestive Heart Failure    VS Filed Vitals:   02/16/16 1255  BP: 116/66  Pulse: 98  Height: 5' 8.5" (1.74 m)  Weight: 129 lb (58.514 kg)  SpO2: 99%    Weights Current Weight  02/16/16 129 lb (58.514 kg)  02/14/16 156 lb 9.6 oz (71.033 kg)  02/02/16 149 lb (67.586 kg)    Blood Pressure  BP Readings from Last 3 Encounters:  02/16/16 116/66  02/14/16 126/82  02/02/16 141/79     Admit date:  (Not on file) Last encounter with RMR:  02/14/2016   Allergy Rivaroxaban  Current Outpatient Prescriptions  Medication Sig Dispense Refill  . amLODipine-olmesartan (AZOR) 5-40 MG per tablet Take 1 tablet by mouth daily.    Marland Kitchen aspirin 325 MG tablet Take 325 mg by mouth daily.    . Calcium Citrate-Vitamin D (CALCIUM + D PO) Take 1,200 mg by mouth daily.    . folic acid (FOLVITE) 329 MCG tablet Take 400 mcg by mouth daily.     . methotrexate (RHEUMATREX) 2.5 MG tablet Take 5 mg by mouth once a week. sunday    . potassium chloride SA (KLOR-CON M20) 20 MEQ tablet Take 2 tablets (40 mEq total) by mouth daily. 60 tablet 6  . propranolol (INDERAL) 40 MG tablet Take 40 mg by mouth.     . simvastatin (ZOCOR) 40 MG tablet Take 40 mg by mouth daily.      Marland Kitchen torsemide (DEMADEX) 20 MG tablet Take 2 tablets (40 mg total) by mouth 2 (two) times daily. 120 tablet 6   No current facility-administered medications for this visit.    Discontinued Meds:   There are no discontinued medications.  Patient Active Problem List   Diagnosis Date Noted  . Weight loss 10/22/2014  . Pacemaker 02/18/2013  . Bronchitis 02/18/2013  . Fractured pelvis ([place]) 05-31-2013  . Atrial fibrillation, new onset ([place]) 09/29/2012  . Syncope 09/29/2012  . ETOH  abuse 09/29/2012  . Hypertension 10/26/2010  . ADENOCARCINOMA, PROSTATE 05/05/2010  . Hyperlipidemia 05/05/2010  . TOBACCO ABUSE 05/05/2010  . TREMOR, ESSENTIAL 05/05/2010  . Sick sinus syndrome ([facility]) 05/05/2010    LABS    Component Value Date/Time   NA 129* 02/14/2016 1350   NA 135 04/28/2015 1320   NA 138 11/11/2012 1531   K 3.9 02/14/2016 1350   K 4.3 04/28/2015 1320   K 4.6 11/11/2012 1531   CL 96* 02/14/2016 1350   CL 103 04/28/2015 1320   CL 103 11/11/2012 1531   CO2 26 02/14/2016 1350   CO2 23 04/28/2015 1320   CO2 28 11/11/2012 1531   GLUCOSE 103* 02/14/2016 1350   GLUCOSE 102* 04/28/2015 1320   GLUCOSE 98 11/11/2012 1531   BUN 20 02/14/2016 1350   BUN 19 04/28/2015 1320   BUN 16 11/11/2012 1531   CREATININE 0.70 02/14/2016 1350   CREATININE 0.56* 04/28/2015 1320   CREATININE 0.64 11/24/2012 0936   CALCIUM 8.5* 02/14/2016 1350   CALCIUM 9.1 04/28/2015 1320   CALCIUM 9.6 11/11/2012 1531   GFRNONAA >60 02/14/2016 1350   GFRNONAA >60 04/28/2015 1320   GFRNONAA >90 11/24/2012 9242  GFRAA >60 02/14/2016 1350   GFRAA >60 04/28/2015 1320   GFRAA >90 11/24/2012 0936   CMP     Component Value Date/Time   NA 129* 02/14/2016 1350   K 3.9 02/14/2016 1350   CL 96* 02/14/2016 1350   CO2 26 02/14/2016 1350   GLUCOSE 103* 02/14/2016 1350   BUN 20 02/14/2016 1350   CREATININE 0.70 02/14/2016 1350   CALCIUM 8.5* 02/14/2016 1350   PROT 6.6 02/14/2016 1350   ALBUMIN 4.0 02/14/2016 1350   AST 31 02/14/2016 1350   ALT 18 02/14/2016 1350   ALKPHOS 191* 02/14/2016 1350   BILITOT 1.7* 02/14/2016 1350   GFRNONAA >60 02/14/2016 1350   GFRAA >60 02/14/2016 1350       Component Value Date/Time   WBC 6.8 02/14/2016 1350   WBC 7.6 04/28/2015 1320   WBC 7.4 11/24/2012 0936   HGB 15.6 02/14/2016 1350   HGB 15.7 04/28/2015 1320   HGB 14.3 11/24/2012 0936   HCT 46.4 02/14/2016 1350   HCT 47.4 04/28/2015 1320   HCT 40.7 11/24/2012 0936   MCV 96.3 02/14/2016 1350   MCV  97.7 04/28/2015 1320   MCV 89.1 11/24/2012 0936    Lipid Panel     Component Value Date/Time   CHOL 163 12/07/2011 1333   TRIG 100 12/07/2011 1333   HDL 57 12/07/2011 1333   CHOLHDL 2.9 12/07/2011 1333   VLDL 20 12/07/2011 1333   LDLCALC 86 12/07/2011 1333    ABG    Component Value Date/Time   PHART 7.428 05/03/2010 1015   PCO2ART 36.2 05/03/2010 1015   PO2ART 83.7 05/03/2010 1015   HCO3 23.5 05/03/2010 1015   TCO2 20.2 05/03/2010 1015   O2SAT 96.4 05/03/2010 1015     No results found for: TSH BNP (last 3 results) No results for input(s): BNP in the last 8760 hours.  ProBNP (last 3 results) No results for input(s): PROBNP in the last 8760 hours.  Cardiac Panel (last 3 results) No results for input(s): CKTOTAL, CKMB, TROPONINI, RELINDX in the last 72 hours.  Iron/TIBC/Ferritin/ %Sat No results found for: IRON, TIBC, FERRITIN, IRONPCTSAT   EKG Orders placed or performed in visit on 12/07/15  . EKG 12-Lead     Prior Assessment and Plan Problem List as of 02/16/2016      Cardiovascular and Mediastinum   Sick sinus syndrome [facility])   Last Assessment & Plan 12/12/2010 Office Visit Written 12/13/2010  9:52 PM by Yehuda Savannah, MD    Patient remains asymptomatic with respect to his conduction system disease.  It appears likely that symptomatic bradycardia will occur at some point at which time pacemaker insertion will be necessary.      Hypertension   Last Assessment & Plan 10/22/2014 Office Visit Written 10/22/2014 11:39 AM by Evans Lance, MD    His blood pressure is elevated. He admits to dietary and medical non-compliance. He is encouraged to reduce his sodium intake.       Atrial fibrillation, new onset [facility])   Last Assessment & Plan 2020-12-1113 Office Visit Written 2020-12-1113  9:57 AM by Evans Lance, MD    His ventricular rate is well controlled. Will follow. He is not a candidate for any anti-coagulation.        Syncope   Last Assessment & Plan 11/11/2012  Office Visit Written 11/11/2012  3:15 PM by Evans Lance, MD    The patient has syncope, atrial fibrillation, and documented pauses of over 3 seconds  on cardiac monitoring. All this occurred in the absence of any AV nodal blocking drugs. I've discussed the treatment options with the patient. He would be a good candidate for insertion of our lead was pacemaker. This be scheduled in the next couple of weeks.        Respiratory   Bronchitis   Last Assessment & Plan 02/18/2013 Office Visit Written 02/18/2013  8:56 AM by Evans Lance, MD    The patient has bronchitis with blood-tinged sputum. He reports that he has undergone evaluation by Dr. Luan Pulling. I recommended that he continue his anticoagulation regimen as the benefits outweigh the risk. However if his blood-tinged sputum turns into frank hemoptysis, bronchoscopy would be a consideration.        Musculoskeletal and Integument   Fractured pelvis [facility]     Genitourinary   ADENOCARCINOMA, PROSTATE   Last Assessment & Plan 12/07/2011 Office Visit Edited 12/08/2011 10:46 PM by Yehuda Savannah, MD    No evidence for recurrent carcinoma.        Other   Hyperlipidemia   Last Assessment & Plan 12/07/2011 Office Visit Written 12/08/2011 10:41 PM by Yehuda Savannah, MD    Recent lipid profile demonstrates adequate control of hyperlipidemia with current medication, which will be continued.      TOBACCO ABUSE   Last Assessment & Plan 10/22/2014 Office Visit Written 10/22/2014 11:40 AM by Evans Lance, MD    He is encouraged to stop smoking. Will follow.       TREMOR, ESSENTIAL   ETOH abuse   Last Assessment & Plan 10/30/2012 Office Visit Edited 10/30/2012 12:28 PM by Lendon Colonel, NP    The patient diminshes is the amount of alcohol he drinks daily.       Pacemaker   Last Assessment & Plan 10/22/2014 Office Visit Written 10/22/2014 11:39 AM by Evans Lance, MD    His medtronic DDD PM is working normally. Will recheck in several months.       Weight loss   Last Assessment & Plan 10/22/2014 Office Visit Written 10/22/2014 11:38 AM by Evans Lance, MD    The etiology is uncertain. He will undergo CXR as he has a longstanding h/o tobacco abuse. If negative he will followup with medical MD for additional evaluation.           Imaging: No results found.

## 2016-02-16 NOTE — Progress Notes (Signed)
Cardiology Office Note   Date:  02/16/2016   ID:  QUINNLAN ABRUZZO, DOB 09-30-1935, MRN [id number]  PCP:  Alonza Bogus, MD  Cardiologist: Bryna Colander, NP   Chief Complaint  Patient presents with  . Congestive Heart Failure      History of Present Illness: Peter Lopez is a 80 y.o. male who presents for ongoing assessment and management of symptomatic bradycardia, status post permanent pacemaker insertion, history of chronic atrial fibrillation not on chronic anticoagulation. He was seen two days ago in the office due to LEE edema and decompensated diastolic CHF. He was placed on Demedex 40 mg BID for two days with potassium replacement. He is here for quick follow up to evaluate his response to treatment.   Labs were completed prior to diureses. NA 129, potassium 3.9, Creatinine 0.70. He was not anemic. Echo is pending.   He is doing much better today. His weight has gone down 23 lbs since being seen last. He is breathing improved, he can sleep lying down and he has no more chest pressure. He did have some cramping in his feet this morning.   Past Medical History  Diagnosis Date  . Exertional dyspnea   . 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 Hypertension   . Hyperlipidemia   . Benign essential tremor   . Tobacco abuse     30 pack years; discontinued in 2000; currently one pack per week  . Prostate cancer ([place])     S/P Prostatectomy  . Pacemaker 11/24/2012    Dr Lovena Le    Past Surgical History  Procedure Laterality Date  . Meniscectomy      Right knee open meniscectomy  . Knee surgery      Left laparoscopic knee surgery  . Robot assisted laparoscopic radical prostatectomy    . Appendectomy    . Colonoscopy  2011  . Hernia repair    . Insert / replace / remove pacemaker  11/24/2012    Dr Cristopher Peru  . Permanent pacemaker insertion N/A 11/24/2012    Procedure: PERMANENT PACEMAKER INSERTION;  Surgeon: Evans Lance, MD;  Location: [facility] CATH LAB;  Service:  Cardiovascular;  Laterality: N/A;  . Cataract extraction w/phaco Left 05/03/2015    Procedure: CATARACT EXTRACTION PHACO AND INTRAOCULAR LENS PLACEMENT LEFT EYE;  Surgeon: Rutherford Guys, MD;  Location: [facility] ORS;  Service: Ophthalmology;  Laterality: Left;  CDE:8.03     Current Outpatient Prescriptions  Medication Sig Dispense Refill  . amLODipine-olmesartan (AZOR) 5-40 MG per tablet Take 1 tablet by mouth daily.    Marland Kitchen aspirin 325 MG tablet Take 325 mg by mouth daily.    . Calcium Citrate-Vitamin D (CALCIUM + D PO) Take 1,200 mg by mouth daily.    . folic acid (FOLVITE) 992 MCG tablet Take 400 mcg by mouth daily.     . methotrexate (RHEUMATREX) 2.5 MG tablet Take 5 mg by mouth once a week. sunday    . potassium chloride SA (KLOR-CON M20) 20 MEQ tablet Take 2 tablets (40 mEq total) by mouth daily. 60 tablet 6  . propranolol (INDERAL) 40 MG tablet Take 40 mg by mouth.     . simvastatin (ZOCOR) 40 MG tablet Take 40 mg by mouth daily.      Marland Kitchen torsemide (DEMADEX) 20 MG tablet Take 2 tablets (40 mg total) by mouth 2 (two) times daily. 120 tablet 6   No current facility-administered medications for this visit.    Allergies:  Rivaroxaban    Social History:  The patient  reports that he quit smoking about 17 years ago. He has never used smokeless tobacco. He reports that he drinks alcohol. He reports that he does not use illicit drugs.   Family History:  The patient's family history includes Alzheimer's disease (age of onset: 62) in his mother; Early death (age of onset: 53) in his father; Heart failure in his brother.    ROS: All other systems are reviewed and negative. Unless otherwise mentioned in H&P    PHYSICAL EXAM: VS:  BP 116/66 mmHg  Pulse 98  Ht 5' 8.5" (1.74 m)  Wt 129 lb (58.514 kg)  BMI 19.33 kg/m2  SpO2 99% , BMI Body mass index is 19.33 kg/(m^2). GEN: Well nourished, well developed, in no acute distress HEENT: normal Neck: no JVD, carotid bruits, or masses Cardiac: RRR;  distant,  no murmurs, rubs, or gallops,no edema  Respiratory:  Clear to auscultation bilaterally, normal work of breathing GI: soft, nontender, nondistended, + BS MS: no deformity or atrophy Skin: warm and dry, no rash Neuro:  Strength and sensation are intact Psych: euthymic mood, full affect  Recent Labs: 02/14/2016: ALT 18; BUN 20; Creatinine, Ser 0.70; Hemoglobin 15.6; Platelets 166; Potassium 3.9; Sodium 129*    Lipid Panel    Component Value Date/Time   CHOL 163 12/07/2011 1333   TRIG 100 12/07/2011 1333   HDL 57 12/07/2011 1333   CHOLHDL 2.9 12/07/2011 1333   VLDL 20 12/07/2011 1333   LDLCALC 86 12/07/2011 1333      Wt Readings from Last 3 Encounters:  02/16/16 129 lb (58.514 kg)  02/14/16 156 lb 9.6 oz (71.033 kg)  02/02/16 149 lb (67.586 kg)     ASSESSMENT AND PLAN:  1. Acute on Chronic Diastolic CHF: He has had excellent diureses and improvement in symptoms. I will decrease the diuretics from 40 mg BID, to 20 mg daily and daily potassium supplements. He is to continue to weigh daily. He will keep appointment to have echo next week now that he can lie down without breathing issues. He will have a follow up BMET today.   2. Hypertension: BP is soft. May need to cut back on amlodipine and olmesartan but will wait for echo report before adjusting medications to evaluate LV fx. He will see [place] on follow up after echo is completed.    Current medicines are reviewed at length with the patient today.    Labs/ tests ordered today include: BMET No orders of the defined types were placed in this encounter.     Disposition:   FU with post echo.   Signed, Jory Sims, NP  02/16/2016 1:10 PM    [facility] [address], [place], [place] [address] Phone: [phone]; Fax: [phone]

## 2016-02-16 NOTE — Patient Instructions (Signed)
Medication Instructions:  DECREASE TORSEMIDE TO 20 MG DAILY   Labwork: Your physician recommends that you return for lab work in: TODAY  BMET   Testing/Procedures: Your physician has requested that you have an echocardiogram. Echocardiography is a painless test that uses sound waves to create images of your heart. It provides your doctor with information about the size and shape of your heart and how well your heart's chambers and valves are working. This procedure takes approximately one hour. There are no restrictions for this procedure.  ** KEEP THE ECHO APPT. **  Follow-Up: Your physician recommends that you schedule a follow-up appointment in: AFTER THE ECHOCARDIOGRAM    Any Other Special Instructions Will Be Listed Below (If Applicable).     If you need a refill on your cardiac medications before your next appointment, please call your pharmacy.

## 2016-02-16 NOTE — Addendum Note (Signed)
Addended by: Barbarann Ehlers A on: 02/16/2016 02:19 PM   Modules accepted: Orders, Medications

## 2016-02-23 ENCOUNTER — Ambulatory Visit (HOSPITAL_COMMUNITY)
Admission: RE | Admit: 2016-02-23 | Discharge: 2016-02-23 | Disposition: A | Discharge status: Home / Self Care | Payer: PPO | Source: Ambulatory Visit | Attending: Adult Health | Admitting: Adult Health

## 2016-02-23 DIAGNOSIS — E785 Hyperlipidemia, unspecified: Secondary | ICD-10-CM | POA: Insufficient documentation

## 2016-02-23 DIAGNOSIS — I340 Nonrheumatic mitral (valve) insufficiency: Secondary | ICD-10-CM | POA: Insufficient documentation

## 2016-02-23 DIAGNOSIS — I071 Rheumatic tricuspid insufficiency: Secondary | ICD-10-CM | POA: Insufficient documentation

## 2016-02-23 DIAGNOSIS — I351 Nonrheumatic aortic (valve) insufficiency: Secondary | ICD-10-CM | POA: Insufficient documentation

## 2016-02-23 DIAGNOSIS — J449 Chronic obstructive pulmonary disease, unspecified: Secondary | ICD-10-CM | POA: Diagnosis not present

## 2016-02-23 DIAGNOSIS — Z720 Tobacco use: Secondary | ICD-10-CM | POA: Diagnosis not present

## 2016-02-23 DIAGNOSIS — I119 Hypertensive heart disease without heart failure: Secondary | ICD-10-CM | POA: Diagnosis not present

## 2016-02-23 DIAGNOSIS — I4891 Unspecified atrial fibrillation: Secondary | ICD-10-CM | POA: Diagnosis not present

## 2016-02-23 DIAGNOSIS — I10 Essential (primary) hypertension: Secondary | ICD-10-CM | POA: Diagnosis not present

## 2016-02-23 LAB — ECHOCARDIOGRAM COMPLETE
AVLVOTPG: 2 mmHg
E decel time: 190 msec
EERAT: 23.77
FS: 30 % (ref 28–44)
IVS/LV PW RATIO, ED: 1.07
LA ID, A-P, ES: 55 mm
LA diam end sys: 55 mm
LA diam index: 3.22 cm/m2
LA vol index: 45.4 mL/m2
LAVOL: 77.6 mL
LAVOLA4C: 63.6 mL
LDCA: 2.84 cm2
LV SIMPSON'S DISK: 59
LV dias vol index: 42 mL/m2
LV dias vol: 73 mL (ref 62–150)
LV sys vol index: 17 mL/m2
LVEEAVG: 23.77
LVEEMED: 23.77
LVOT SV: 37 mL
LVOT VTI: 13 cm
LVOT diameter: 19 mm
LVOT peak vel: 62.5 cm/s
LVSYSVOL: 30 mL (ref 21–61)
MV Dec: 190
MV Peak grad: 2 mmHg
MVPKEVEL: 71.3 m/s
PW: 9.53 mm — AB (ref 0.6–1.1)
RV TAPSE: 14.7 mm
Reg peak vel: 259 cm/s
Stroke v: 43 ml
TDI e' medial: 3
TRMAXVEL: 259 cm/s

## 2016-02-23 NOTE — Progress Notes (Signed)
*  PRELIMINARY RESULTS* Echocardiogram 2D Echocardiogram has been performed.  Peter Lopez 02/23/2016, 10:32 AM

## 2016-02-27 ENCOUNTER — Encounter: Payer: Self-pay | Admitting: *Deleted

## 2016-02-27 ENCOUNTER — Ambulatory Visit (INDEPENDENT_AMBULATORY_CARE_PROVIDER_SITE_OTHER): Payer: PPO | Admitting: Cardiology

## 2016-02-27 ENCOUNTER — Telehealth: Payer: Self-pay | Admitting: Cardiology

## 2016-02-27 ENCOUNTER — Encounter: Payer: Self-pay | Admitting: Cardiology

## 2016-02-27 ENCOUNTER — Other Ambulatory Visit: Payer: Self-pay | Admitting: Cardiology

## 2016-02-27 VITALS — BP 144/68 | HR 67 | Ht 69.0 in | Wt 128.2 lb

## 2016-02-27 DIAGNOSIS — I519 Heart disease, unspecified: Secondary | ICD-10-CM

## 2016-02-27 DIAGNOSIS — I272 Other secondary pulmonary hypertension: Secondary | ICD-10-CM

## 2016-02-27 DIAGNOSIS — I5032 Chronic diastolic (congestive) heart failure: Secondary | ICD-10-CM

## 2016-02-27 DIAGNOSIS — I4891 Unspecified atrial fibrillation: Secondary | ICD-10-CM | POA: Diagnosis not present

## 2016-02-27 DIAGNOSIS — Z950 Presence of cardiac pacemaker: Secondary | ICD-10-CM | POA: Diagnosis not present

## 2016-02-27 DIAGNOSIS — I5189 Other ill-defined heart diseases: Secondary | ICD-10-CM

## 2016-02-27 NOTE — Progress Notes (Signed)
Clinical Summary Peter Lopez is a 80 y.o.male seen today for follow up of the following medical problems. He was last seen by NP Peter Lopez, this is our first visit togeterh.    1. Symptomatic bradycardia - has permanent pacemaker follows by Peter Lopez, no recent symptoms.    2. Chronic afib - has not been on anticoag due to previous side effects according to previous notes.  - no recent palpitations.   3. Chronic diastolic HF - fairly recent issues with volume overload, over 20 lbs weight gain with significant edema. . Was changed to torsemide '40mg'$  bid with 23 lbs weight loss. Now down to 128 lbs and stable at his baseline weight. Currently taking torsemide '20mg'$  once daily - repeat echo showed LVEF 55-60%, cannot evaluate diastolic dysfunction, severely dilated RV, moderate RV dysfunction with TAPSE 1.2, PASP 35.   4. HTN - compliant with meds  5. RV dysfunction/Pulmonary HTN - recent echo with severely dilated RV, moderate RV dysfunction. Moderate to severe TR with dilated TV anulus, PASP 35 by echo.  - echo 09/2012 personally reviewed. Mild to moderate RV enlargement, severe pulmonary HTN with PASP 73. Based on TR spectral Doppler waveform PASP may be underestimated - PFTs 10/2013 airway obstruction, severely decreased DLCO. CT chest 11/2012 with emphsymatous changes and granulomatous changes, ? Bibasilar fibrosis.  - on methotrexate for polymyalgia rheumatia - no snoring, no apneic episodes, some daytime fatigue.     Past Medical History  Diagnosis Date  . Exertional dyspnea   . 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 Hypertension   . Hyperlipidemia   . Benign essential tremor   . Tobacco abuse     30 pack years; discontinued in 2000; currently one pack per week  . Prostate cancer ([place])     S/P Prostatectomy  . Pacemaker 11/24/2012    Peter Lopez     Allergies  Allergen Reactions  . Rivaroxaban     Other reaction(s): Other (See Comments) Coughing up blood      Current Outpatient Prescriptions  Medication Sig Dispense Refill  . amLODipine-olmesartan (AZOR) 5-40 MG per tablet Take 1 tablet by mouth daily.    Peter Lopez Kitchen aspirin 325 MG tablet Take 325 mg by mouth daily.    . Calcium Citrate-Vitamin D (CALCIUM + D PO) Take 1,200 mg by mouth daily.    . folic acid (FOLVITE) 174 MCG tablet Take 400 mcg by mouth daily.     . methotrexate (RHEUMATREX) 2.5 MG tablet Take 5 mg by mouth once a week. sunday    . potassium chloride SA (K-DUR,KLOR-CON) 20 MEQ tablet Take 20 mEq by mouth once.    . propranolol (INDERAL) 40 MG tablet Take 40 mg by mouth.     . simvastatin (ZOCOR) 40 MG tablet Take 40 mg by mouth daily.      Peter Lopez Kitchen torsemide (DEMADEX) 20 MG tablet Take 1 tablet (20 mg total) by mouth daily. 180 tablet 3   No current facility-administered medications for this visit.     Past Surgical History  Procedure Laterality Date  . Meniscectomy      Right knee open meniscectomy  . Knee surgery      Left laparoscopic knee surgery  . Robot assisted laparoscopic radical prostatectomy    . Appendectomy    . Colonoscopy  2011  . Hernia repair    . Insert / replace / remove pacemaker  11/24/2012    Peter Lopez  . Permanent  pacemaker insertion N/A 11/24/2012    Procedure: PERMANENT PACEMAKER INSERTION;  Surgeon: Peter Lance, Peter Lopez;  Location: [facility] CATH LAB;  Service: Cardiovascular;  Laterality: N/A;  . Cataract extraction w/phaco Left 05/03/2015    Procedure: CATARACT EXTRACTION PHACO AND INTRAOCULAR LENS PLACEMENT LEFT EYE;  Surgeon: Peter Guys, Peter Lopez;  Location: [facility] ORS;  Service: Ophthalmology;  Laterality: Left;  CDE:8.03     Allergies  Allergen Reactions  . Rivaroxaban     Other reaction(s): Other (See Comments) Coughing up blood      Family History  Problem Relation Age of Onset  . Alzheimer's disease Mother 27  . Early death Father 32    trauma  . Heart failure Brother      Social History Peter Lopez reports that he quit smoking about 17  years ago. He has never used smokeless tobacco. Peter Lopez reports that he drinks alcohol.   Review of Systems CONSTITUTIONAL: No weight loss, fever, chills, weakness or fatigue.  HEENT: Eyes: No visual loss, blurred vision, double vision or yellow sclerae.No hearing loss, sneezing, congestion, runny nose or sore throat.  SKIN: No rash or itching.  CARDIOVASCULAR: per HPI RESPIRATORY: per HPI  GASTROINTESTINAL: No anorexia, nausea, vomiting or diarrhea. No abdominal pain or blood.  GENITOURINARY: No burning on urination, no polyuria NEUROLOGICAL: No headache, dizziness, syncope, paralysis, ataxia, numbness or tingling in the extremities. No change in bowel or bladder control.  MUSCULOSKELETAL: No muscle, back pain, joint pain or stiffness.  LYMPHATICS: No enlarged nodes. No history of splenectomy.  PSYCHIATRIC: No history of depression or anxiety.  ENDOCRINOLOGIC: No reports of sweating, cold or heat intolerance. No polyuria or polydipsia.  Peter Lopez Kitchen   Physical Examination Filed Vitals:   02/27/16 1311  BP: 144/68  Pulse: 67   Filed Vitals:   02/27/16 1311  Height: '5\' 9"'$  (1.753 m)  Weight: 128 lb 3.2 oz (58.151 kg)    Gen: resting comfortably, no acute distress HEENT: no scleral icterus, pupils equal round and reactive, no palptable cervical adenopathy,  CV: RRR, no m/r/g, no jvd Resp: Clear to auscultation bilaterally GI: abdomen is soft, non-tender, non-distended, normal bowel sounds, no hepatosplenomegaly MSK: extremities are warm, no edema.  Skin: warm, no rash Neuro:  no focal deficits Psych: appropriate affect   Diagnostic Studies 02/2016 echo Study Conclusions  - Left ventricle: The cavity size was normal. Wall thickness was  increased in a pattern of mild LVH. Systolic function was normal.  The estimated ejection fraction was in the range of 55% to 60%.  Wall motion was normal; there were no regional wall motion  abnormalities. The study is not technically  sufficient to allow  evaluation of LV diastolic function. - Aortic valve: Mildly calcified annulus. Trileaflet; mildly  thickened leaflets. There was mild regurgitation. Valve area  (VTI): 2.1 cm^2. Valve area (Vmax): 1.79 cm^2. Valve area  (Vmean): 1.75 cm^2. - Mitral valve: Mildly calcified annulus. Mildly thickened leaflets  . There was mild regurgitation. - Left atrium: The atrium was severely dilated. - Right ventricle: The cavity size was severely dilated. Systolic  function was moderately reduced. TAPSE: 12 mm . - Right atrium: The atrium was massively dilated. - Tricuspid valve: There was moderate-severe regurgitation. By  color Doppler TR appears moderate, the eccentricity of the jet  may lead to underestimation. There is systolic hepatic flow  reversal which suggests the TR is severe. - Pulmonary arteries: Systolic pressure was mildly increased. PA  peak pressure: 35 mm Hg (S). - Technically adequate  study.    Assessment and Plan  1. Symptomatic bradycardia - no current symptoms, pacemaker followed by EP  2. Chronic afib - no recent symptoms. Has not been on anticoag in the past due to adverse reaction, defer to EP who has followed patient for several years  3. Chronic diastolic HF - appears euvolemic, continue current diuretic  4. Pulmonary HTN/ RV systolic dysfunction - evidence of severe pulmonary HTN by echo 09/2012, PASP at least 73 and potentially underestimated based on TR waveform - recent echo shows worsening RV with severe RV dilatation, moderate dysfunction, and moderate to severe TR with TV anular dilation. PASPS 35, though potentially underestiamted due to decreased RV function and severity of TR - concern would be pulm HTN related to his automimmune disease vs COPD vs left heart disease. Abnormal PFTs and CT chest in the past, ? COPD or interstitial lung disease, likely will need pulmonary evaluation. We will obtain RHC to verify pressures and help  establish etiology. Likely played a role in his recent severe volume overload - continue diuretic.    F/u 1 month. Potential pulmonary and/or pulm HTN referral pending RHC numbers.    Arnoldo Lenis, M.D.

## 2016-02-27 NOTE — Patient Instructions (Signed)
Your physician recommends that you schedule a follow-up appointment in: 1 month with Dr. Harl Bowie  Your physician recommends that you continue on your current medications as directed. Please refer to the Current Medication list given to you today.  Your physician has requested that you have a cardiac catheterization. Cardiac catheterization is used to diagnose and/or treat various heart conditions. Doctors may recommend this procedure for a number of different reasons. The most common reason is to evaluate chest pain. Chest pain can be a symptom of coronary artery disease (CAD), and cardiac catheterization can show whether plaque is narrowing or blocking your heart's arteries. This procedure is also used to evaluate the valves, as well as measure the blood flow and oxygen levels in different parts of your heart. For further information please visit [email or web address]. Please follow instruction sheet, as given.  Thank you for choosing [facility]!!

## 2016-02-27 NOTE — Telephone Encounter (Signed)
[place] 03/01/16 @ 9AM dr. Claiborne Billings

## 2016-02-28 NOTE — Telephone Encounter (Signed)
HTA [id number] valid 02-28-16 to 08-26-16

## 2016-03-01 ENCOUNTER — Ambulatory Visit (HOSPITAL_COMMUNITY)
Admission: RE | Admit: 2016-03-01 | Discharge: 2016-03-01 | Disposition: A | Discharge status: Home / Self Care | Payer: PPO | Source: Ambulatory Visit | Attending: Cardiovascular Disease | Admitting: Cardiovascular Disease

## 2016-03-01 ENCOUNTER — Ambulatory Visit: Payer: PPO | Admitting: Adult Health

## 2016-03-01 ENCOUNTER — Encounter (HOSPITAL_COMMUNITY)
Admission: RE | Disposition: A | Discharge status: Home / Self Care | Payer: Self-pay | Source: Ambulatory Visit | Attending: Cardiovascular Disease

## 2016-03-01 ENCOUNTER — Encounter (HOSPITAL_COMMUNITY): Payer: Self-pay | Admitting: Cardiovascular Disease

## 2016-03-01 DIAGNOSIS — I482 Chronic atrial fibrillation: Secondary | ICD-10-CM | POA: Diagnosis not present

## 2016-03-01 DIAGNOSIS — M353 Polymyalgia rheumatica: Secondary | ICD-10-CM | POA: Diagnosis not present

## 2016-03-01 DIAGNOSIS — Z950 Presence of cardiac pacemaker: Secondary | ICD-10-CM | POA: Insufficient documentation

## 2016-03-01 DIAGNOSIS — E785 Hyperlipidemia, unspecified: Secondary | ICD-10-CM | POA: Insufficient documentation

## 2016-03-01 DIAGNOSIS — I272 Other secondary pulmonary hypertension: Secondary | ICD-10-CM | POA: Diagnosis not present

## 2016-03-01 DIAGNOSIS — Z8546 Personal history of malignant neoplasm of prostate: Secondary | ICD-10-CM | POA: Diagnosis not present

## 2016-03-01 DIAGNOSIS — Z7982 Long term (current) use of aspirin: Secondary | ICD-10-CM | POA: Diagnosis not present

## 2016-03-01 DIAGNOSIS — Z8249 Family history of ischemic heart disease and other diseases of the circulatory system: Secondary | ICD-10-CM | POA: Insufficient documentation

## 2016-03-01 DIAGNOSIS — G250 Essential tremor: Secondary | ICD-10-CM | POA: Insufficient documentation

## 2016-03-01 DIAGNOSIS — I5042 Chronic combined systolic (congestive) and diastolic (congestive) heart failure: Secondary | ICD-10-CM | POA: Diagnosis not present

## 2016-03-01 DIAGNOSIS — I110 Hypertensive heart disease with heart failure: Secondary | ICD-10-CM | POA: Insufficient documentation

## 2016-03-01 HISTORY — PX: CARDIAC CATHETERIZATION: SHX172

## 2016-03-01 LAB — POCT I-STAT 3, VENOUS BLOOD GAS (G3P V)
ACID-BASE EXCESS: 2 mmol/L (ref 0.0–2.0)
BICARBONATE: 27.9 meq/L — AB (ref 20.0–24.0)
O2 SAT: 62 %
PO2 VEN: 33 mmHg (ref 31.0–45.0)
TCO2: 29 mmol/L (ref 0–100)
pCO2, Ven: 47 mmHg (ref 45.0–50.0)
pH, Ven: 7.383 — ABNORMAL HIGH (ref 7.250–7.300)

## 2016-03-01 LAB — CBC
HEMATOCRIT: 48.9 % (ref 39.0–52.0)
Hemoglobin: 16.5 g/dL (ref 13.0–17.0)
MCH: 32.4 pg (ref 26.0–34.0)
MCHC: 33.7 g/dL (ref 30.0–36.0)
MCV: 95.9 fL (ref 78.0–100.0)
PLATELETS: 196 10*3/uL (ref 150–400)
RBC: 5.1 MIL/uL (ref 4.22–5.81)
RDW: 13.8 % (ref 11.5–15.5)
WBC: 5.2 10*3/uL (ref 4.0–10.5)

## 2016-03-01 LAB — BASIC METABOLIC PANEL
Anion gap: 8 (ref 5–15)
BUN: 12 mg/dL (ref 6–20)
CALCIUM: 8.8 mg/dL — AB (ref 8.9–10.3)
CO2: 30 mmol/L (ref 22–32)
CREATININE: 0.74 mg/dL (ref 0.61–1.24)
Chloride: 95 mmol/L — ABNORMAL LOW (ref 101–111)
GFR calc Af Amer: >60 mL/min (ref >60)
GLUCOSE: 91 mg/dL (ref 65–99)
POTASSIUM: 4.1 mmol/L (ref 3.5–5.1)
SODIUM: 133 mmol/L — AB (ref 135–145)

## 2016-03-01 LAB — PROTIME-INR
INR: 1.39 (ref 0.00–1.49)
PROTHROMBIN TIME: 17.2 s — AB (ref 11.6–15.2)

## 2016-03-01 SURGERY — RIGHT HEART CATH

## 2016-03-01 MED ORDER — LIDOCAINE HCL (PF) 1 % IJ SOLN
INTRAMUSCULAR | Status: AC
  Filled 2016-03-01: qty 30

## 2016-03-01 MED ORDER — MIDAZOLAM HCL 2 MG/2ML IJ SOLN
INTRAMUSCULAR | Status: DC | PRN
  Administered 2016-03-01: 1 mg via INTRAVENOUS

## 2016-03-01 MED ORDER — SODIUM CHLORIDE 0.9 % IV SOLN: 250.0000 mL | INTRAVENOUS | Status: DC | PRN

## 2016-03-01 MED ORDER — SODIUM CHLORIDE 0.9% FLUSH: 3.0000 mL | Freq: Two times a day (BID) | INTRAVENOUS | Status: DC

## 2016-03-01 MED ORDER — DIAZEPAM 5 MG PO TABS: 5.0000 mg | ORAL_TABLET | ORAL | Status: DC | PRN

## 2016-03-01 MED ORDER — FENTANYL CITRATE (PF) 100 MCG/2ML IJ SOLN
INTRAMUSCULAR | Status: AC
  Filled 2016-03-01: qty 2

## 2016-03-01 MED ORDER — SODIUM CHLORIDE 0.9 % IV SOLN
INTRAVENOUS | Status: DC
  Administered 2016-03-01: 08:00:00 via INTRAVENOUS

## 2016-03-01 MED ORDER — ONDANSETRON HCL 4 MG/2ML IJ SOLN: 4.0000 mg | Freq: Four times a day (QID) | INTRAMUSCULAR | Status: DC | PRN

## 2016-03-01 MED ORDER — FENTANYL CITRATE (PF) 100 MCG/2ML IJ SOLN
INTRAMUSCULAR | Status: DC | PRN
  Administered 2016-03-01: 25 ug via INTRAVENOUS

## 2016-03-01 MED ORDER — HEPARIN (PORCINE) IN NACL 2-0.9 UNIT/ML-% IJ SOLN
INTRAMUSCULAR | Status: AC
  Filled 2016-03-01: qty 500

## 2016-03-01 MED ORDER — ACETAMINOPHEN 325 MG PO TABS: 650.0000 mg | ORAL_TABLET | ORAL | Status: DC | PRN

## 2016-03-01 MED ORDER — LIDOCAINE HCL (PF) 1 % IJ SOLN
INTRAMUSCULAR | Status: DC | PRN
Start: 2016-03-01 — End: 2016-03-01
  Administered 2016-03-01: 5 mL via SUBCUTANEOUS
  Administered 2016-03-01: 2 mL via SUBCUTANEOUS

## 2016-03-01 MED ORDER — SODIUM CHLORIDE 0.9% FLUSH: 3.0000 mL | INTRAVENOUS | Status: DC | PRN

## 2016-03-01 MED ORDER — SODIUM CHLORIDE 0.9 % IV SOLN
250.0000 mL | INTRAVENOUS | Status: DC | PRN
Start: 2016-03-01 — End: 2016-03-01

## 2016-03-01 MED ORDER — HEPARIN (PORCINE) IN NACL 2-0.9 UNIT/ML-% IJ SOLN
INTRAMUSCULAR | Status: DC | PRN
  Administered 2016-03-01: 500 mL

## 2016-03-01 MED ORDER — MIDAZOLAM HCL 2 MG/2ML IJ SOLN
INTRAMUSCULAR | Status: AC
  Filled 2016-03-01: qty 2

## 2016-03-01 MED ORDER — SODIUM CHLORIDE 0.9 % IV SOLN: INTRAVENOUS | Status: DC

## 2016-03-01 SURGICAL SUPPLY — 11 items
CATH BALLN WEDGE 5F 110CM (CATHETERS) ×1 IMPLANT
CATH SWAN GANZ 7F STRAIGHT (CATHETERS) ×1 IMPLANT
DEVICE TORQUE H2O (MISCELLANEOUS) ×1 IMPLANT
GUIDEWIRE .025 260CM (WIRE) ×1 IMPLANT
PACK CARDIAC CATHETERIZATION (CUSTOM PROCEDURE TRAY) ×1 IMPLANT
PROTECTION STATION PRESSURIZED (MISCELLANEOUS) ×2
SHEATH FAST CATH BRACH 5F 5CM (SHEATH) ×1 IMPLANT
SHEATH PINNACLE 7F 10CM (SHEATH) ×1 IMPLANT
STATION PROTECTION PRESSURIZED (MISCELLANEOUS) IMPLANT
TUBING ART PRESS 72  MALE/FEM (TUBING) ×1
TUBING ART PRESS 72 MALE/FEM (TUBING) IMPLANT

## 2016-03-01 NOTE — Progress Notes (Signed)
Site area: Right brachial a 5 french venous sheath was removed  Site Prior to Removal:  Level 0  Pressure Applied For 10 MINUTES    Bedrest Beginning at 1125am  Manual:   Yes.    Patient Status During Pull:  stable  Post Pull Groin Site:  Level 0  Post Pull Instructions Given:  Yes.    Post Pull Pulses Present:  Yes.    Dressing Applied:  Yes.    Comments:  VS remain stable during sheath pull

## 2016-03-01 NOTE — Interval H&P Note (Signed)
History and Physical Interval Note:  03/01/2016 9:57 AM  Peter Lopez  has presented today for surgery, with the diagnosis of hp  The various methods of treatment have been discussed with the patient and family. After consideration of risks, benefits and other options for treatment, the patient has consented to  Procedure(s): Right Heart Cath (N/A) as a surgical intervention .  The patient's history has been reviewed, patient examined, no change in status, stable for surgery.  I have reviewed the patient's chart and labs.  Questions were answered to the patient's satisfaction.     Shelva Majestic

## 2016-03-01 NOTE — Progress Notes (Signed)
Site area: Right groin a 7 french venous sheath was removed  Site Prior to Removal:  Level 0  Pressure Applied For 15 MINUTES    Bedrest Beginning at 1125am  Manual:   Yes.    Patient Status During Pull:  stable  Post Pull Groin Site:  Level 0  Post Pull Instructions Given:  Yes.    Post Pull Pulses Present:  Yes.    Dressing Applied:  Yes.    Comments:  VS remain stable during sheath pull

## 2016-03-01 NOTE — Discharge Instructions (Signed)

## 2016-03-01 NOTE — H&P (View-Only) (Signed)
Clinical Summary [name] is a 80 y.o.male seen today for follow up of the following medical problems. He was last seen by NP Purcell Nails, this is our first visit togeterh.    1. 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    2. Chronic afib - has not been on anticoag due to previous side effects according to previous notes.  - no recent palpitations.   3. Chronic diastolic HF - fairly recent issues with volume overload, over 20 lbs weight gain with significant edema. . Was changed to torsemide '40mg'$  bid with 23 lbs weight loss. Now down to 128 lbs and stable at his baseline weight. Currently taking torsemide '20mg'$  once daily - repeat echo showed LVEF 55-60%, cannot evaluate diastolic dysfunction, severely dilated RV, moderate RV dysfunction with TAPSE 1.2, PASP 35.   4. HTN - compliant with meds  5. RV dysfunction/Pulmonary HTN - recent echo with severely dilated RV, moderate RV dysfunction. Moderate to severe TR with dilated TV anulus, PASP 35 by echo.  - echo 09/2012 personally reviewed. Mild to moderate RV enlargement, severe pulmonary HTN with PASP 73. Based on TR spectral Doppler waveform PASP may be underestimated - PFTs 10/2013 airway obstruction, severely decreased DLCO. CT chest 11/2012 with emphsymatous changes and granulomatous changes, ? Bibasilar fibrosis.  - on methotrexate for polymyalgia rheumatia - no snoring, no apneic episodes, some daytime fatigue.     Past Medical History  Diagnosis Date  . Exertional dyspnea   . 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 Hypertension   . Hyperlipidemia   . Benign essential tremor   . Tobacco abuse     30 pack years; discontinued in 2000; currently one pack per week  . Prostate cancer ([place])     S/P Prostatectomy  . Pacemaker 11/24/2012    Dr Lovena Le     Allergies  Allergen Reactions  . Rivaroxaban     Other reaction(s): Other (See Comments) Coughing up blood      Current Outpatient Prescriptions  Medication Sig Dispense Refill  . amLODipine-olmesartan (AZOR) 5-40 MG per tablet Take 1 tablet by mouth daily.    Marland Kitchen aspirin 325 MG tablet Take 325 mg by mouth daily.    . Calcium Citrate-Vitamin D (CALCIUM + D PO) Take 1,200 mg by mouth daily.    . folic acid (FOLVITE) 712 MCG tablet Take 400 mcg by mouth daily.     . methotrexate (RHEUMATREX) 2.5 MG tablet Take 5 mg by mouth once a week. sunday    . potassium chloride SA (K-DUR,KLOR-CON) 20 MEQ tablet Take 20 mEq by mouth once.    . propranolol (INDERAL) 40 MG tablet Take 40 mg by mouth.     . simvastatin (ZOCOR) 40 MG tablet Take 40 mg by mouth daily.      Marland Kitchen torsemide (DEMADEX) 20 MG tablet Take 1 tablet (20 mg total) by mouth daily. 180 tablet 3   No current facility-administered medications for this visit.     Past Surgical History  Procedure Laterality Date  . Meniscectomy      Right knee open meniscectomy  . Knee surgery      Left laparoscopic knee surgery  . Robot assisted laparoscopic radical prostatectomy    . Appendectomy    . Colonoscopy  2011  . Hernia repair    . Insert / replace / remove pacemaker  11/24/2012    Dr Cristopher Peru  . Permanent  pacemaker insertion N/A 11/24/2012    Procedure: PERMANENT PACEMAKER INSERTION;  Surgeon: Evans Lance, MD;  Location: [facility] CATH LAB;  Service: Cardiovascular;  Laterality: N/A;  . Cataract extraction w/phaco Left 05/03/2015    Procedure: CATARACT EXTRACTION PHACO AND INTRAOCULAR LENS PLACEMENT LEFT EYE;  Surgeon: Rutherford Guys, MD;  Location: [facility];  Service: Ophthalmology;  Laterality: Left;  CDE:8.03     Allergies  Allergen Reactions  . Rivaroxaban     Other reaction(s): Other (See Comments) Coughing up blood      Family History  Problem Relation Age of Onset  . Alzheimer's disease Mother 91  . Early death Father 69    trauma  . Heart failure Brother      Social History Mr. Minshall reports that he quit smoking about 17  years ago. He has never used smokeless tobacco. Mr. Baby reports that he drinks alcohol.   Review of Systems CONSTITUTIONAL: No weight loss, fever, chills, weakness or fatigue.  HEENT: Eyes: No visual loss, blurred vision, double vision or yellow sclerae.No hearing loss, sneezing, congestion, runny nose or sore throat.  SKIN: No rash or itching.  CARDIOVASCULAR: per HPI RESPIRATORY: per HPI  GASTROINTESTINAL: No anorexia, nausea, vomiting or diarrhea. No abdominal pain or blood.  GENITOURINARY: No burning on urination, no polyuria NEUROLOGICAL: No headache, dizziness, syncope, paralysis, ataxia, numbness or tingling in the extremities. No change in bowel or bladder control.  MUSCULOSKELETAL: No muscle, back pain, joint pain or stiffness.  LYMPHATICS: No enlarged nodes. No history of splenectomy.  PSYCHIATRIC: No history of depression or anxiety.  ENDOCRINOLOGIC: No reports of sweating, cold or heat intolerance. No polyuria or polydipsia.  Marland Kitchen   Physical Examination Filed Vitals:   02/27/16 1311  BP: 144/68  Pulse: 67   Filed Vitals:   02/27/16 1311  Height: '5\' 9"'$  (1.753 m)  Weight: 128 lb 3.2 oz (58.151 kg)    Gen: resting comfortably, no acute distress HEENT: no scleral icterus, pupils equal round and reactive, no palptable cervical adenopathy,  CV: RRR, no m/r/g, no jvd Resp: Clear to auscultation bilaterally GI: abdomen is soft, non-tender, non-distended, normal bowel sounds, no hepatosplenomegaly MSK: extremities are warm, no edema.  Skin: warm, no rash Neuro:  no focal deficits Psych: appropriate affect   Diagnostic Studies 02/2016 echo Study Conclusions  - Left ventricle: The cavity size was normal. Wall thickness was  increased in a pattern of mild LVH. Systolic function was normal.  The estimated ejection fraction was in the range of 55% to 60%.  Wall motion was normal; there were no regional wall motion  abnormalities. The study is not technically  sufficient to allow  evaluation of LV diastolic function. - Aortic valve: Mildly calcified annulus. Trileaflet; mildly  thickened leaflets. There was mild regurgitation. Valve area  (VTI): 2.1 cm^2. Valve area (Vmax): 1.79 cm^2. Valve area  (Vmean): 1.75 cm^2. - Mitral valve: Mildly calcified annulus. Mildly thickened leaflets  . There was mild regurgitation. - Left atrium: The atrium was severely dilated. - Right ventricle: The cavity size was severely dilated. Systolic  function was moderately reduced. TAPSE: 12 mm . - Right atrium: The atrium was massively dilated. - Tricuspid valve: There was moderate-severe regurgitation. By  color Doppler TR appears moderate, the eccentricity of the jet  may lead to underestimation. There is systolic hepatic flow  reversal which suggests the TR is severe. - Pulmonary arteries: Systolic pressure was mildly increased. PA  peak pressure: 35 mm Hg (S). - Technically adequate  study.    Assessment and Plan  1. Symptomatic bradycardia - no current symptoms, pacemaker followed by EP  2. Chronic afib - no recent symptoms. Has not been on anticoag in the past due to adverse reaction, defer to EP who has followed patient for several years  3. Chronic diastolic HF - appears euvolemic, continue current diuretic  4. Pulmonary HTN/ RV systolic dysfunction - evidence of severe pulmonary HTN by echo 09/2012, PASP at least 73 and potentially underestimated based on TR waveform - recent echo shows worsening RV with severe RV dilatation, moderate dysfunction, and moderate to severe TR with TV anular dilation. PASPS 35, though potentially underestiamted due to decreased RV function and severity of TR - concern would be pulm HTN related to his automimmune disease vs COPD vs left heart disease. Abnormal PFTs and CT chest in the past, ? COPD or interstitial lung disease, likely will need pulmonary evaluation. We will obtain RHC to verify pressures and help  establish etiology. Likely played a role in his recent severe volume overload - continue diuretic.    F/u 1 month. Potential pulmonary and/or pulm HTN referral pending RHC numbers.    Arnoldo Lenis, M.D.

## 2016-03-02 ENCOUNTER — Telehealth: Payer: Self-pay | Admitting: *Deleted

## 2016-03-02 NOTE — Telephone Encounter (Signed)
Pt aware and voiced understanding - appt for August confirmed

## 2016-03-02 NOTE — Telephone Encounter (Signed)
-----   Message from Arnoldo Lenis, MD sent at 03/02/2016  9:06 AM EDT ----- Reviewed right heart cath results, fortunately results show just mild pulmonary hypertension. We will continue to monitor at this time and discuss in more detail at our upcoming follow up.

## 2016-03-07 ENCOUNTER — Encounter: Payer: PPO | Admitting: *Deleted

## 2016-03-12 ENCOUNTER — Ambulatory Visit (INDEPENDENT_AMBULATORY_CARE_PROVIDER_SITE_OTHER): Payer: PPO | Admitting: *Deleted

## 2016-03-12 DIAGNOSIS — I495 Sick sinus syndrome: Secondary | ICD-10-CM | POA: Diagnosis not present

## 2016-03-12 NOTE — Progress Notes (Signed)
Remote pacemaker transmission.   

## 2016-03-13 LAB — CUP PACEART INCLINIC DEVICE CHECK
Battery Remaining Longevity: 68 mo
Date Time Interrogation Session: 20170723222009
Implantable Lead Implant Date: 20140407
Implantable Lead Location: 753860
Lead Channel Pacing Threshold Amplitude: 0.75 V
Lead Channel Pacing Threshold Pulse Width: 0.4 ms
Lead Channel Setting Pacing Amplitude: 2.5 V
MDC IDC MSMT BATTERY IMPEDANCE: 753 Ohm
MDC IDC MSMT BATTERY VOLTAGE: 2.77 V
MDC IDC MSMT LEADCHNL RA IMPEDANCE VALUE: 0 Ohm
MDC IDC MSMT LEADCHNL RV IMPEDANCE VALUE: 665 Ohm
MDC IDC SET LEADCHNL RV PACING PULSEWIDTH: 0.4 ms
MDC IDC SET LEADCHNL RV SENSING SENSITIVITY: 2 mV
MDC IDC STAT BRADY RV PERCENT PACED: 99 %

## 2016-03-14 ENCOUNTER — Encounter: Payer: Self-pay | Admitting: Cardiology

## 2016-03-16 ENCOUNTER — Emergency Department (HOSPITAL_COMMUNITY): Payer: PPO

## 2016-03-16 ENCOUNTER — Encounter (HOSPITAL_COMMUNITY): Payer: Self-pay | Admitting: Emergency Medicine

## 2016-03-16 ENCOUNTER — Emergency Department (HOSPITAL_COMMUNITY)
Admission: EM | Admit: 2016-03-16 | Discharge: 2016-03-16 | Disposition: A | Discharge status: Home / Self Care | Payer: PPO | Attending: Emergency Medicine | Admitting: Emergency Medicine

## 2016-03-16 DIAGNOSIS — I10 Essential (primary) hypertension: Secondary | ICD-10-CM | POA: Diagnosis not present

## 2016-03-16 DIAGNOSIS — S72111A Displaced fracture of greater trochanter of right femur, initial encounter for closed fracture: Secondary | ICD-10-CM

## 2016-03-16 DIAGNOSIS — Z7982 Long term (current) use of aspirin: Secondary | ICD-10-CM | POA: Insufficient documentation

## 2016-03-16 DIAGNOSIS — Z87891 Personal history of nicotine dependence: Secondary | ICD-10-CM | POA: Diagnosis not present

## 2016-03-16 DIAGNOSIS — Y9389 Activity, other specified: Secondary | ICD-10-CM | POA: Diagnosis not present

## 2016-03-16 DIAGNOSIS — Y929 Unspecified place or not applicable: Secondary | ICD-10-CM | POA: Diagnosis not present

## 2016-03-16 DIAGNOSIS — S72114A Nondisplaced fracture of greater trochanter of right femur, initial encounter for closed fracture: Secondary | ICD-10-CM | POA: Insufficient documentation

## 2016-03-16 DIAGNOSIS — Z950 Presence of cardiac pacemaker: Secondary | ICD-10-CM | POA: Diagnosis not present

## 2016-03-16 DIAGNOSIS — M25551 Pain in right hip: Secondary | ICD-10-CM | POA: Diagnosis not present

## 2016-03-16 DIAGNOSIS — W1809XA Striking against other object with subsequent fall, initial encounter: Secondary | ICD-10-CM | POA: Insufficient documentation

## 2016-03-16 DIAGNOSIS — R079 Chest pain, unspecified: Secondary | ICD-10-CM | POA: Diagnosis not present

## 2016-03-16 DIAGNOSIS — Z79899 Other long term (current) drug therapy: Secondary | ICD-10-CM | POA: Diagnosis not present

## 2016-03-16 DIAGNOSIS — S79911A Unspecified injury of right hip, initial encounter: Secondary | ICD-10-CM | POA: Diagnosis not present

## 2016-03-16 DIAGNOSIS — Z8546 Personal history of malignant neoplasm of prostate: Secondary | ICD-10-CM | POA: Diagnosis not present

## 2016-03-16 DIAGNOSIS — R531 Weakness: Secondary | ICD-10-CM | POA: Diagnosis not present

## 2016-03-16 DIAGNOSIS — S299XXA Unspecified injury of thorax, initial encounter: Secondary | ICD-10-CM | POA: Diagnosis not present

## 2016-03-16 DIAGNOSIS — Y999 Unspecified external cause status: Secondary | ICD-10-CM | POA: Diagnosis not present

## 2016-03-16 LAB — URINALYSIS, ROUTINE W REFLEX MICROSCOPIC
BILIRUBIN URINE: NEGATIVE
GLUCOSE, UA: NEGATIVE mg/dL
Hgb urine dipstick: NEGATIVE
KETONES UR: NEGATIVE mg/dL
Leukocytes, UA: NEGATIVE
NITRITE: NEGATIVE
PH: 6 (ref 5.0–8.0)
Protein, ur: NEGATIVE mg/dL
Specific Gravity, Urine: <1.005 — ABNORMAL LOW (ref 1.005–1.030)

## 2016-03-16 LAB — CBC WITH DIFFERENTIAL/PLATELET
Basophils Absolute: 0 10*3/uL (ref 0.0–0.1)
Basophils Relative: 0 %
EOS ABS: 0.1 10*3/uL (ref 0.0–0.7)
Eosinophils Relative: 2 %
HEMATOCRIT: 40 % (ref 39.0–52.0)
HEMOGLOBIN: 13.8 g/dL (ref 13.0–17.0)
LYMPHS ABS: 1.3 10*3/uL (ref 0.7–4.0)
LYMPHS PCT: 19 %
MCH: 32.5 pg (ref 26.0–34.0)
MCHC: 34.5 g/dL (ref 30.0–36.0)
MCV: 94.1 fL (ref 78.0–100.0)
Monocytes Absolute: 0.9 10*3/uL (ref 0.1–1.0)
Monocytes Relative: 13 %
NEUTROS ABS: 4.6 10*3/uL (ref 1.7–7.7)
NEUTROS PCT: 66 %
Platelets: 150 10*3/uL (ref 150–400)
RBC: 4.25 MIL/uL (ref 4.22–5.81)
RDW: 14.1 % (ref 11.5–15.5)
WBC: 6.9 10*3/uL (ref 4.0–10.5)

## 2016-03-16 LAB — COMPREHENSIVE METABOLIC PANEL
ALK PHOS: 140 U/L — AB (ref 38–126)
ALT: 21 U/L (ref 17–63)
AST: 25 U/L (ref 15–41)
Albumin: 3.7 g/dL (ref 3.5–5.0)
Anion gap: 5 (ref 5–15)
BUN: 18 mg/dL (ref 6–20)
CALCIUM: 8.4 mg/dL — AB (ref 8.9–10.3)
CO2: 24 mmol/L (ref 22–32)
CREATININE: 0.64 mg/dL (ref 0.61–1.24)
Chloride: 102 mmol/L (ref 101–111)
GFR calc non Af Amer: >60 mL/min (ref >60)
Glucose, Bld: 146 mg/dL — ABNORMAL HIGH (ref 65–99)
Potassium: 4.2 mmol/L (ref 3.5–5.1)
SODIUM: 131 mmol/L — AB (ref 135–145)
Total Bilirubin: 1.5 mg/dL — ABNORMAL HIGH (ref 0.3–1.2)
Total Protein: 6.5 g/dL (ref 6.5–8.1)

## 2016-03-16 LAB — TROPONIN I: Troponin I: <0.03 ng/mL (ref <0.03)

## 2016-03-16 MED ORDER — SODIUM CHLORIDE 0.9 % IV BOLUS (SEPSIS)
1000.0000 mL | Freq: Once | INTRAVENOUS | Status: AC
  Administered 2016-03-16: 1000 mL via INTRAVENOUS

## 2016-03-16 MED ORDER — HYDROCODONE-ACETAMINOPHEN 5-325 MG PO TABS: 1.0000 | ORAL_TABLET | Freq: Once | ORAL | Status: DC

## 2016-03-16 MED ORDER — HYDROCODONE-ACETAMINOPHEN 5-325 MG PO TABS: 1.0000 | ORAL_TABLET | ORAL | 0 refills | Status: DC | PRN

## 2016-03-16 NOTE — ED Triage Notes (Signed)
Pt had increase in weakness monday.  Reports change in diuretic with increased wt loss.  Pt reports feeling unable to walk starting yesterday.  Reports fall on Monday and resulting hip pain.

## 2016-03-16 NOTE — Discharge Instructions (Signed)
You may bear weight on your leg as tolerated and use your walker. Follow up with Dr. Aline Brochure or Dr. Ninfa Linden. Return to the ED if you develop new or worsening symptoms.

## 2016-03-16 NOTE — ED Notes (Signed)
Pt assisted to bathroom via wheelchair.

## 2016-03-16 NOTE — ED Notes (Addendum)
Family at Bedside

## 2016-03-16 NOTE — ED Notes (Signed)
Pt waiting to be evaluated by ortho. Still declines pain medication

## 2016-03-16 NOTE — ED Provider Notes (Signed)
[facility] DEPT Provider Note   CSN: [id number] Arrival date & time: 03/16/16  6195  First Provider Contact:  9:19 AM    By signing my name below, I, Rayna Sexton, attest that this documentation has been prepared under the direction and in the presence of Ezequiel Essex, MD. Electronically Signed: Rayna Sexton, ED Scribe. 03/16/16. 9:50 AM.   History   Chief Complaint Chief Complaint  Patient presents with  . Hip Pain  . Weakness    HPI HPI Comments: Peter Lopez is a 80 y.o. male who presents to the Emergency Department complaining of a fall that occurred 4 days ago. Pt states he stepped off of a curb and tripped resulting in his fall. Pt states that he had felt "wobbly" at the time of his fall and his wife states it seemed as if his "knees gave out on him". He reports associated, worsening, moderate, right hip pain stating that he was able to ambulate initially and his pain has worsened to the point that he can no longer ambulate. He describes his feeling as "instability of my lower torso". Pt takes 1 aspirin per day but denies being on any other blood thinning medications.  He is currently on diuretics. He denies a PMHx of MI or DM and a SHx including cardiac stents. He denies LOC, HA, neck pain, head trauma, dizziness, lightheadedness, abd pain, CP and n/v/d.    The history is provided by the patient and the spouse. No language interpreter was used.  Weakness  Pertinent negatives include no chest pain, no abdominal pain and no headaches.    Past Medical History:  Diagnosis Date  . 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 Benign essential tremor   . Exertional dyspnea   . Hyperlipidemia   . Hypertension   . Pacemaker 11/24/2012   Dr Lovena Le  . Prostate cancer ([place])    S/P Prostatectomy  . Tobacco abuse    30 pack years; discontinued in 2000; currently one pack per week    Patient Active Problem List   Diagnosis Date Noted  . Pulmonary hypertension ([place])   .  Weight loss 10/22/2014  . Pacemaker 02/18/2013  . Bronchitis 02/18/2013  . Fractured pelvis ([place]) 08/06/202014  . Atrial fibrillation, new onset ([place]) 09/29/2012  . Syncope 09/29/2012  . ETOH abuse 09/29/2012  . Hypertension 10/26/2010  . ADENOCARCINOMA, PROSTATE 05/05/2010  . Hyperlipidemia 05/05/2010  . TOBACCO ABUSE 05/05/2010  . TREMOR, ESSENTIAL 05/05/2010  . Sick sinus syndrome ([place]) 05/05/2010    Past Surgical History:  Procedure Laterality Date  . APPENDECTOMY    . CARDIAC CATHETERIZATION N/A 03/01/2016   Procedure: Right Heart Cath;  Surgeon: Troy Sine, MD;  Location: [facility];  Service: Cardiovascular;  Laterality: N/A;  . CATARACT EXTRACTION W/PHACO Left 05/03/2015   Procedure: CATARACT EXTRACTION PHACO AND INTRAOCULAR LENS PLACEMENT LEFT EYE;  Surgeon: Rutherford Guys, MD;  Location: [facility];  Service: Ophthalmology;  Laterality: Left;  CDE:8.03  . COLONOSCOPY  2011  . HERNIA REPAIR    . INSERT / REPLACE / REMOVE PACEMAKER  11/24/2012   Dr Cristopher Peru  . KNEE SURGERY     Left laparoscopic knee surgery  . MENISCECTOMY     Right knee open meniscectomy  . PERMANENT PACEMAKER INSERTION N/A 11/24/2012   Procedure: PERMANENT PACEMAKER INSERTION;  Surgeon: Evans Lance, MD;  Location: [facility] CATH LAB;  Service: Cardiovascular;  Laterality: N/A;  . ROBOT ASSISTED LAPAROSCOPIC RADICAL PROSTATECTOMY  Home Medications    Prior to Admission medications   Medication Sig Start Date End Date Taking? Authorizing Provider  acetaminophen (TYLENOL) 500 MG tablet Take 1,000 mg by mouth every 6 (six) hours as needed for moderate pain.   Yes Historical Provider, MD  amLODipine-olmesartan (AZOR) 5-40 MG per tablet Take 1 tablet by mouth daily.   Yes Historical Provider, MD  aspirin 325 MG tablet Take 325 mg by mouth daily.   Yes Historical Provider, MD  Calcium Citrate-Vitamin D (CALCIUM + D PO) Take 1,200 mg by mouth daily.   Yes Historical Provider, MD  folic acid  (FOLVITE) 629 MCG tablet Take 400 mcg by mouth daily.    Yes Historical Provider, MD  Leuprolide Acetate, 6 Month, (LUPRON DEPOT, 2-MONTH,) 45 MG injection Inject 45 mg into the muscle every 6 (six) months.   Yes Historical Provider, MD  LINZESS 72 MCG capsule Take 1 capsule by mouth daily as needed for constipation. 12/21/15  Yes Historical Provider, MD  methotrexate (RHEUMATREX) 2.5 MG tablet Take 2.5 mg by mouth once a week. sunday   Yes Historical Provider, MD  potassium chloride SA (K-DUR,KLOR-CON) 20 MEQ tablet Take 40 mEq by mouth daily.    Yes Historical Provider, MD  propranolol (INDERAL) 40 MG tablet Take 40 mg by mouth daily.  02/07/13  Yes Historical Provider, MD  simvastatin (ZOCOR) 40 MG tablet Take 40 mg by mouth daily.     Yes Historical Provider, MD  torsemide (DEMADEX) 20 MG tablet Take 1 tablet (20 mg total) by mouth daily. 02/16/16  Yes Lendon Colonel, NP  umeclidinium-vilanterol (ANORO ELLIPTA) 62.5-25 MCG/INH AEPB Inhale 1 puff into the lungs daily.   Yes Historical Provider, MD  HYDROcodone-acetaminophen (NORCO/VICODIN) 5-325 MG tablet Take 1 tablet by mouth every 4 (four) hours as needed. 03/16/16   Ezequiel Essex, MD    Family History Family History  Problem Relation Age of Onset  . Alzheimer's disease Mother 56  . Early death Father 98    trauma  . Heart failure Brother     Social History Social History  Substance Use Topics  . Smoking status: Former Smoker    Packs/day: 1.00    Quit date: 12/11/1998  . Smokeless tobacco: Never Used  . Alcohol use 0.0 oz/week     Comment: modest     Allergies   Rivaroxaban   Review of Systems Review of Systems  Cardiovascular: Negative for chest pain.  Gastrointestinal: Negative for abdominal pain, diarrhea, nausea and vomiting.  Musculoskeletal: Positive for arthralgias. Negative for neck pain.  Neurological: Positive for weakness. Negative for dizziness, syncope, light-headedness and headaches.  All other systems  reviewed and are negative.  Physical Exam Updated Vital Signs BP 136/76 (BP Location: Right Arm)   Pulse 80   Temp 97.7 F (36.5 C) (Oral)   Resp 20   Ht '5\' 8"'$  (1.727 m)   Wt 128 lb (58.1 kg)   SpO2 100%   BMI 19.46 kg/m   Physical Exam  Constitutional: He is oriented to person, place, and time. He appears well-developed and well-nourished. No distress.  HENT:  Head: Normocephalic and atraumatic.  Mouth/Throat: Oropharynx is clear and moist. No oropharyngeal exudate.  Eyes: Conjunctivae and EOM are normal. Pupils are equal, round, and reactive to light.  Neck: Normal range of motion. Neck supple.  No meningismus. No cervical spine tenderness.   Cardiovascular: Normal rate, regular rhythm, normal heart sounds and intact distal pulses.   No murmur heard. Pulmonary/Chest: Effort  normal and breath sounds normal. No respiratory distress.  Abdominal: Soft. There is no tenderness. There is no rebound and no guarding.  Musculoskeletal: Normal range of motion. He exhibits tenderness. He exhibits no edema.  TTP of right lateral hip with reduced ROM; slightly externally rotated   Neurological: He is alert and oriented to person, place, and time. No cranial nerve deficit. He exhibits normal muscle tone. Coordination normal.   5/5 strength throughout. CN 2-12 intact.Equal grip strength.   Skin: Skin is warm.  Psychiatric: He has a normal mood and affect. His behavior is normal.  Nursing note and vitals reviewed.   ED Treatments / Results  Labs (all labs ordered are listed, but only abnormal results are displayed) Labs Reviewed  COMPREHENSIVE METABOLIC PANEL - Abnormal; Notable for the following:       Result Value   Sodium 131 (*)    Glucose, Bld 146 (*)    Calcium 8.4 (*)    Alkaline Phosphatase 140 (*)    Total Bilirubin 1.5 (*)    All other components within normal limits  URINALYSIS, ROUTINE W REFLEX MICROSCOPIC (NOT AT [facility]) - Abnormal; Notable for the following:    Specific  Gravity, Urine <1.005 (*)    All other components within normal limits  TROPONIN I  CBC WITH DIFFERENTIAL/PLATELET    EKG  EKG Interpretation  Date/Time:  Friday March 16 2016 10:02:53 EDT Ventricular Rate:  51 PR Interval:    QRS Duration: 164 QT Interval:  509 QTC Calculation: 469 R Axis:   -88 Text Interpretation:  Ventricular-paced rhythm No further analysis attempted due to paced rhythm No significant change was found Confirmed by Wyvonnia Dusky  MD, Briauna Gilmartin [phone]) on 03/16/2016 2:38:11 PM       Radiology Dg Chest 2 View  Result Date: 03/16/2016 CLINICAL DATA:  Pain following fall EXAM: CHEST  2 VIEW COMPARISON:  October 22, 2013 FINDINGS: There is scarring in the bases. Lungs appear somewhat hyperexpanded. There is no edema or consolidation. There is cardiomegaly with slight pulmonary venous hypertension. There is atherosclerotic calcification in the aorta. Pacemaker lead tip is attached to the right ventricle. No pneumothorax. There old healed rib fractures on the left. Bones are osteoporotic. No acute fracture evident. No adenopathy. IMPRESSION: Lungs hyperexpanded with bibasilar scarring. No edema or consolidation. Pulmonary vascular congestion present. Aortic atherosclerosis. Old rib trauma on the left. Bones osteoporotic. No pneumothorax. Electronically Signed   By: Lowella Grip III M.D.   On: 03/16/2016 10:39  Ct Pelvis Wo Contrast  Result Date: 03/16/2016 CLINICAL DATA:  Fall on right hip, continued pain EXAM: CT PELVIS WITHOUT CONTRAST TECHNIQUE: Multidetector CT imaging of the pelvis was performed following the standard protocol without intravenous contrast. COMPARISON:  Right hip radiographs dated 03/16/2016 FINDINGS: No evidence of right femoral neck fracture. However, there is a nondisplaced fracture of the right greater trochanter (series 7/images 76-82). Old right superior and inferior pubic rami fracture deformities. Bilateral hip joint spaces are preserved. Degenerative  changes of the lower lumbar spine. Bladder is within normal limits. No pelvic ascites. No suspicious pelvic lymphadenopathy. Atherosclerotic calcifications of the abdominal aorta and branch vessels. IMPRESSION: Nondisplaced fracture of the right greater trochanter. No evidence of right femoral neck fracture. Old right superior and inferior pubic rami fractures. Electronically Signed   By: Julian Hy M.D.   On: 03/16/2016 14:08  Dg Hip Unilat W Or Wo Pelvis 2-3 Views Right  Result Date: 03/16/2016 CLINICAL DATA:  Pain following fall 4 days prior  EXAM: DG HIP (WITH OR WITHOUT PELVIS) 2-3V RIGHT COMPARISON:  None. FINDINGS: Frontal pelvis as well as frontal and lateral right hip images were obtained. Bones are somewhat osteoporotic. There is evidence of old healed fractures of the right superior pubic ramus and ischium. There is no acute fracture or dislocation. Joint spaces appear normal. There is extensive arterial vascular calcification. IMPRESSION: No acute fracture or dislocation. Old trauma with healing involving the right superior pubic ramus and ischium. Extensive atherosclerosis with multiple foci of arterial vascular calcification in the pelvis and proximal thighs. Electronically Signed   By: Lowella Grip III M.D.   On: 03/16/2016 10:37  Dg Femur Min 2 Views Right  Result Date: 03/16/2016 CLINICAL DATA:  Status post fall 03/12/2016 with right hip pain. The patient fell again yesterday and is now unable to walk. EXAM: RIGHT FEMUR 2 VIEWS COMPARISON:  None. FINDINGS: No acute bony or joint abnormality is identified. No notable degenerative change about the right hip. Moderate appearing degenerative change about the medial compartment of the right knee is noted. Extensive atherosclerotic vascular disease is seen. IMPRESSION: No acute abnormality. Electronically Signed   By: Inge Rise M.D.   On: 03/16/2016 11:35   Procedures Procedures  DIAGNOSTIC STUDIES: Oxygen Saturation is 98%  on RA, normal by my interpretation.    COORDINATION OF CARE: 9:25 AM Discussed next steps with pt. Pt verbalized understanding and is agreeable with the plan.    Medications Ordered in ED Medications  HYDROcodone-acetaminophen (NORCO/VICODIN) 5-325 MG per tablet 1 tablet (not administered)  sodium chloride 0.9 % bolus 1,000 mL (0 mLs Intravenous Stopped 03/16/16 1618)     Initial Impression / Assessment and Plan / ED Course  I have reviewed the triage vital signs and the nursing notes.  Pertinent labs & imaging results that were available during my care of the patient were reviewed by me and considered in my medical decision making (see chart for details).  Clinical Course  mechanical fall onto R hip four days ago, now not able to bear weight. Did not hit head.  Hip xray negative CT does show R greater trochanter fracture. D/w Dr. Ninfa Linden who agrees with pain control, WBAT, and outpatient followup.  Potassium checked at wife's request.  This was normal. She was advised not to give him more potassium that prescribed.   I personally performed the services described in this documentation, which was scribed in my presence. The recorded information has been reviewed and is accurate.   Final Clinical Impressions(s) / ED Diagnoses   Final diagnoses:  Greater trochanter fracture, right, closed, initial encounter    New Prescriptions Discharge Medication List as of 03/16/2016  3:58 PM    START taking these medications   Details  HYDROcodone-acetaminophen (NORCO/VICODIN) 5-325 MG tablet Take 1 tablet by mouth every 4 (four) hours as needed., Starting Fri 03/16/2016, Print         Ezequiel Essex, MD 03/16/16 1710

## 2016-03-16 NOTE — ED Notes (Signed)
Pt declined pain medication . States he only has pain when standing

## 2016-03-16 NOTE — ED Notes (Signed)
Patient family request Potassium be checked. Family member stated she increased in intake.

## 2016-03-16 NOTE — ED Notes (Signed)
Patient transported to X-ray 

## 2016-03-22 ENCOUNTER — Ambulatory Visit (INDEPENDENT_AMBULATORY_CARE_PROVIDER_SITE_OTHER): Payer: PPO | Admitting: Orthopedic Surgery

## 2016-03-22 VITALS — BP 139/72 | Ht 68.5 in | Wt 122.0 lb

## 2016-03-22 DIAGNOSIS — S72111A Displaced fracture of greater trochanter of right femur, initial encounter for closed fracture: Secondary | ICD-10-CM

## 2016-03-22 MED ORDER — HYDROCODONE-ACETAMINOPHEN 10-325 MG PO TABS: 1.0000 | ORAL_TABLET | ORAL | 0 refills | Status: DC | PRN

## 2016-03-22 NOTE — Patient Instructions (Signed)
Use walker for 6 weeks

## 2016-03-22 NOTE — Progress Notes (Signed)
Chief Complaint  Patient presents with  . New Patient (Initial Visit)    Right hip pain s/p fall   HPI 80 year old male fell and injured his right hip x-rays were initially inconclusive because CT scan showed a nondisplaced right greater trochanter fracture  Patient complains of pain over the right greater trochanter which is severe for him constant present since July 25 date of injury. He is using a walker with some modest relief of symptoms  Review of Systems  Constitutional: Negative for chills and fever.  Respiratory: Negative for shortness of breath.   Cardiovascular: Negative for chest pain.    Past Medical History:  Diagnosis Date  . 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 Benign essential tremor   . Exertional dyspnea   . Hyperlipidemia   . Hypertension   . Pacemaker 11/24/2012   Dr Lovena Le  . Prostate cancer ([place])    S/P Prostatectomy  . Tobacco abuse    30 pack years; discontinued in 2000; currently one pack per week    Past Surgical History:  Procedure Laterality Date  . APPENDECTOMY    . CARDIAC CATHETERIZATION N/A 03/01/2016   Procedure: Right Heart Cath;  Surgeon: Troy Sine, MD;  Location: [facility];  Service: Cardiovascular;  Laterality: N/A;  . CATARACT EXTRACTION W/PHACO Left 05/03/2015   Procedure: CATARACT EXTRACTION PHACO AND INTRAOCULAR LENS PLACEMENT LEFT EYE;  Surgeon: Rutherford Guys, MD;  Location: [facility];  Service: Ophthalmology;  Laterality: Left;  CDE:8.03  . COLONOSCOPY  2011  . HERNIA REPAIR    . INSERT / REPLACE / REMOVE PACEMAKER  11/24/2012   Dr Cristopher Peru  . KNEE SURGERY     Left laparoscopic knee surgery  . MENISCECTOMY     Right knee open meniscectomy  . PERMANENT PACEMAKER INSERTION N/A 11/24/2012   Procedure: PERMANENT PACEMAKER INSERTION;  Surgeon: Evans Lance, MD;  Location: [facility] CATH LAB;  Service: Cardiovascular;  Laterality: N/A;  . ROBOT ASSISTED LAPAROSCOPIC RADICAL PROSTATECTOMY     Family History  Problem  Relation Age of Onset  . Alzheimer's disease Mother 65  . Early death Father 52    trauma  . Heart failure Brother    Social History  Substance Use Topics  . Smoking status: Former Smoker    Packs/day: 1.00    Quit date: 12/11/1998  . Smokeless tobacco: Never Used  . Alcohol use 0.0 oz/week     Comment: modest    Current Outpatient Prescriptions:  .  acetaminophen (TYLENOL) 500 MG tablet, Take 1,000 mg by mouth every 6 (six) hours as needed for moderate pain., Disp: , Rfl:  .  amLODipine-olmesartan (AZOR) 5-40 MG per tablet, Take 1 tablet by mouth daily., Disp: , Rfl:  .  aspirin 325 MG tablet, Take 325 mg by mouth daily., Disp: , Rfl:  .  Calcium Citrate-Vitamin D (CALCIUM + D PO), Take 1,200 mg by mouth daily., Disp: , Rfl:  .  folic acid (FOLVITE) 867 MCG tablet, Take 400 mcg by mouth daily. , Disp: , Rfl:  .  HYDROcodone-acetaminophen (NORCO/VICODIN) 5-325 MG tablet, Take 1 tablet by mouth every 4 (four) hours as needed., Disp: 10 tablet, Rfl: 0 .  Leuprolide Acetate, 6 Month, (LUPRON DEPOT, 58-MONTH,) 45 MG injection, Inject 45 mg into the muscle every 6 (six) months., Disp: , Rfl:  .  LINZESS 72 MCG capsule, Take 1 capsule by mouth daily as needed for constipation., Disp: , Rfl:  .  methotrexate ([place])  2.5 MG tablet, Take 2.5 mg by mouth once a week. sunday, Disp: , Rfl:  .  potassium chloride SA (K-DUR,KLOR-CON) 20 MEQ tablet, Take 40 mEq by mouth daily. , Disp: , Rfl:  .  propranolol (INDERAL) 40 MG tablet, Take 40 mg by mouth daily. , Disp: , Rfl:  .  simvastatin (ZOCOR) 40 MG tablet, Take 40 mg by mouth daily.  , Disp: , Rfl:  .  torsemide (DEMADEX) 20 MG tablet, Take 1 tablet (20 mg total) by mouth daily., Disp: 180 tablet, Rfl: 3 .  umeclidinium-vilanterol (ANORO ELLIPTA) 62.5-25 MCG/INH AEPB, Inhale 1 puff into the lungs daily., Disp: , Rfl:   BP 139/72   Ht 5' 8.5" (1.74 m)   Wt 122 lb (55.3 kg)   BMI 18.28 kg/m   Physical Exam  Constitutional: He is oriented  to person, place, and time. He appears well-developed and well-nourished. No distress.  Cardiovascular: Normal rate and intact distal pulses.   Neurological: He is alert and oriented to person, place, and time.  Skin: Skin is warm and dry. No rash noted. He is not diaphoretic. No erythema. No pallor.  Psychiatric: He has a normal mood and affect. His behavior is normal. Judgment and thought content normal.    Ortho Exam Ambulatory status is altered using a walker to assist with ambulation  Left hip nontender well aligned for range of motion normal passive range of motion hip stable strength normal skin intact pulses good distally sensation normal  Right greater trochanter tenderness patient cannot lift his leg into flexion passive range of motion is normal but painful hip is stable neurovascular exam is intact  ASSESSMENT: My personal interpretation of the images:  I reviewed his CT scan and plain films is plain films do not show any fracture he does have calcification of his right femoral artery his CT scan shows a nondisplaced right greater trochanter fracture    PLAN *Nonoperative treatment  Walker pain medication to control and get him comfortable  Walker for 6 weeks x-ray in 6 weeks  Arther Abbott, MD 03/22/2016 2:34 PM

## 2016-03-29 ENCOUNTER — Telehealth: Payer: Self-pay | Admitting: Adult Health

## 2016-03-29 ENCOUNTER — Other Ambulatory Visit (HOSPITAL_COMMUNITY)
Admission: RE | Admit: 2016-03-29 | Discharge: 2016-03-29 | Disposition: A | Discharge status: Home / Self Care | Payer: PPO | Source: Ambulatory Visit | Attending: Adult Health | Admitting: Adult Health

## 2016-03-29 ENCOUNTER — Ambulatory Visit (INDEPENDENT_AMBULATORY_CARE_PROVIDER_SITE_OTHER): Payer: PPO | Admitting: Cardiology

## 2016-03-29 ENCOUNTER — Encounter: Payer: Self-pay | Admitting: Cardiology

## 2016-03-29 DIAGNOSIS — J439 Emphysema, unspecified: Secondary | ICD-10-CM | POA: Diagnosis not present

## 2016-03-29 DIAGNOSIS — I482 Chronic atrial fibrillation, unspecified: Secondary | ICD-10-CM

## 2016-03-29 DIAGNOSIS — I495 Sick sinus syndrome: Secondary | ICD-10-CM | POA: Insufficient documentation

## 2016-03-29 DIAGNOSIS — R634 Abnormal weight loss: Secondary | ICD-10-CM

## 2016-03-29 DIAGNOSIS — I10 Essential (primary) hypertension: Secondary | ICD-10-CM | POA: Diagnosis not present

## 2016-03-29 DIAGNOSIS — I272 Other secondary pulmonary hypertension: Secondary | ICD-10-CM

## 2016-03-29 LAB — BASIC METABOLIC PANEL
Anion gap: 6 (ref 5–15)
BUN: 20 mg/dL (ref 6–20)
CALCIUM: 8.7 mg/dL — AB (ref 8.9–10.3)
CO2: 28 mmol/L (ref 22–32)
CREATININE: 0.86 mg/dL (ref 0.61–1.24)
Chloride: 96 mmol/L — ABNORMAL LOW (ref 101–111)
GFR calc Af Amer: >60 mL/min (ref >60)
GLUCOSE: 110 mg/dL — AB (ref 65–99)
Potassium: 4.6 mmol/L (ref 3.5–5.1)
Sodium: 130 mmol/L — ABNORMAL LOW (ref 135–145)

## 2016-03-29 NOTE — Telephone Encounter (Signed)
Patient wants to speak with nurse regarding patient taking Potassium along with diuretic. / tg

## 2016-03-29 NOTE — Patient Instructions (Signed)
Your physician recommends that you schedule a follow-up appointment with Dr. Harl Bowie.  Your physician has recommended you make the following change in your medication:   Your physician recommends that you hav lab work in done today.  Do not take Torsemide or Potassium until after lab results are reviewed with you.   If you need a refill on your cardiac medications before your next appointment, please call your pharmacy.  Thank you for choosing [facility]!

## 2016-03-29 NOTE — Telephone Encounter (Signed)
Wife says spouse is weaker,requests sooner apt than 8/24 with MD,soonest apt was on Friday of next week and wife declined,stated he had something he had to do.placed on cancellation list , can be at office in 5 minutes per wife.       Addendum: had cancellation today at 1 pm,booked with [facility]

## 2016-03-29 NOTE — Assessment & Plan Note (Signed)
PFTs by Dr Luan Pulling in 2015, he appears to have significant COPD by exam

## 2016-03-29 NOTE — Assessment & Plan Note (Signed)
MDT PTVDP 2014- Dr Lovena Le follows

## 2016-03-29 NOTE — Assessment & Plan Note (Signed)
He has hip DJD that limits his activity, he uses a walker.

## 2016-03-29 NOTE — Progress Notes (Signed)
03/29/2016 Alda Berthold   May 19, 1936  [id number]  Primary Physician Alonza Bogus, MD Primary Cardiologist: Dr Harl Bowie  HPI:  80 y/o male with COPD, SSS-s/p PTVDP,  Atrial fibrillation (not on anticoagulation secondary to previous problems), and pulmonary HTN. He was seen by Jory Sims in June for LE edema. His diuretics were increased for 2 days. His wgt then was 156. When seen in follow up 6/29 his wgt was down to 129. His diuretics were decreased to once a day. He had a Rt heart cath 03/01/16 that suggested mild pulmonary HTN with severe RVD. His wife called concerned because the pt's wgt continues to drop. He weighs 120 lbs today. His B/P has dropped as well- systolic of 371 today. He denies syncope.    Current Outpatient Prescriptions  Medication Sig Dispense Refill  . acetaminophen (TYLENOL) 500 MG tablet Take 1,000 mg by mouth every 6 (six) hours as needed for moderate pain.    Marland Kitchen amLODipine-olmesartan (AZOR) 5-40 MG per tablet Take 1 tablet by mouth daily.    Marland Kitchen aspirin 325 MG tablet Take 325 mg by mouth daily.    . Calcium Citrate-Vitamin D (CALCIUM + D PO) Take 1,200 mg by mouth daily.    . folic acid (FOLVITE) 062 MCG tablet Take 400 mcg by mouth daily.     Marland Kitchen HYDROcodone-acetaminophen (NORCO) 10-325 MG tablet Take 1 tablet by mouth every 4 (four) hours as needed. 42 tablet 0  . Leuprolide Acetate, 6 Month, (LUPRON DEPOT, 34-MONTH,) 45 MG injection Inject 45 mg into the muscle every 6 (six) months.    Marland Kitchen LINZESS 72 MCG capsule Take 1 capsule by mouth daily as needed for constipation.    . methotrexate (RHEUMATREX) 2.5 MG tablet Take 2.5 mg by mouth once a week. sunday    . potassium chloride SA (K-DUR,KLOR-CON) 20 MEQ tablet Take 40 mEq by mouth daily.     . propranolol (INDERAL) 40 MG tablet Take 40 mg by mouth daily.     . simvastatin (ZOCOR) 40 MG tablet Take 40 mg by mouth daily.      Marland Kitchen torsemide (DEMADEX) 20 MG tablet Take 1 tablet (20 mg total) by mouth daily. 180  tablet 3  . umeclidinium-vilanterol (ANORO ELLIPTA) 62.5-25 MCG/INH AEPB Inhale 1 puff into the lungs daily.     No current facility-administered medications for this visit.     Allergies  Allergen Reactions  . Rivaroxaban     Other reaction(s): Other (See Comments) Coughing up blood    Social History   Social History  . Marital status: Married    Spouse name: N/A  . Number of children: N/A  . Years of education: N/A   Occupational History  . Runner, broadcasting/film/video    Social History Main Topics  . Smoking status: Former Smoker    Packs/day: 1.00    Quit date: 12/11/1998  . Smokeless tobacco: Never Used  . Alcohol use 0.0 oz/week     Comment: modest  . Drug use: No  . Sexual activity: Not on file   Other Topics Concern  . Not on file   Social History Narrative  . No narrative on file     Review of Systems: General: negative for chills, fever, night sweats or weight changes.  Cardiovascular: negative for chest pain, dyspnea on exertion, palpitations Dermatological: negative for rash Respiratory: negative for wheezing Urologic: negative for hematuria Abdominal: negative for nausea, vomiting, diarrhea, bright red blood per rectum, melena, or hematemesis Neurologic:  negative for visual changes, syncope, or dizziness All other systems reviewed and are otherwise negative except as noted above.    Blood pressure 104/60, pulse (!) 54, height '5\' 9"'$  (1.753 m), weight 120 lb (54.4 kg), SpO2 98 %.  General appearance: alert, cooperative, cachectic and no distress Neck: no carotid bruit and no JVD Lungs: decreased breath sounds Heart: irregularly irregular rhythm Extremities: no edema Skin: Skin color, texture, turgor normal. No rashes or lesions Neurologic: Grossly normal   ASSESSMENT AND PLAN:   Weight loss Pt is in the office today with complaints of continued weight loss. He weighed 156 lbs in June- now 120 lbs.  Pulmonary hypertension ([place]) Mild by Rt heart cath  03/01/16. He does have severe RVD and TR  Sick sinus syndrome [facility]) MDT PTVDP 2014- Dr Lovena Le follows  Emphysema of lung [facility]) PFTs by Dr Luan Pulling in 2015, he appears to have significant COPD by exam  Chronic atrial fibrillation ([place]) CHADs VASc=3 for age and HTN. He has had problems with anticoagulation in the past.  DJD (degenerative joint disease) of pelvis He has hip DJD that limits his activity, he uses a walker.    PLAN  Mr Mcmichen has been told by various sources to increase his fluid intake-"I drink at least a liter of water a day" and double his potasium.   I've ordered a BMP. I'm not sure if his wgt loss is dehydration from diuretic Rx-vs cachexia from COPD-or possibly some as yet undiscovered metabolic problem (? cancer). Further recommendations pending his BMP result, the last one he had 7/28 did not suggest he was dehydrated. I also told him to drink only when thirsty and not push fluids for now.   Kerin Ransom PA-C 03/29/2016 1:35 PM

## 2016-03-29 NOTE — Assessment & Plan Note (Signed)
CHADs VASc=3 for age and HTN. He has had problems with anticoagulation in the past.

## 2016-03-29 NOTE — Assessment & Plan Note (Signed)
Pt is in the office today with complaints of continued weight loss. He weighed 156 lbs in June- now 120 lbs.

## 2016-03-29 NOTE — Assessment & Plan Note (Signed)
Mild by Rt heart cath 03/01/16. He does have severe RVD and TR

## 2016-04-02 ENCOUNTER — Telehealth: Payer: Self-pay | Admitting: *Deleted

## 2016-04-02 NOTE — Telephone Encounter (Signed)
Called patient with test results. No answer. Left message to call back.  

## 2016-04-02 NOTE — Telephone Encounter (Signed)
-----   Message from Lendon Colonel, NP sent at 04/02/2016  7:10 AM EDT ----- Results reviewed. No changes.

## 2016-04-09 ENCOUNTER — Ambulatory Visit: Payer: PPO | Admitting: Adult Health

## 2016-04-12 ENCOUNTER — Ambulatory Visit (INDEPENDENT_AMBULATORY_CARE_PROVIDER_SITE_OTHER): Payer: PPO | Admitting: Cardiology

## 2016-04-12 ENCOUNTER — Encounter: Payer: Self-pay | Admitting: Cardiology

## 2016-04-12 VITALS — BP 129/66 | HR 77 | Ht 69.0 in | Wt 120.8 lb

## 2016-04-12 DIAGNOSIS — I272 Other secondary pulmonary hypertension: Secondary | ICD-10-CM

## 2016-04-12 DIAGNOSIS — I5032 Chronic diastolic (congestive) heart failure: Secondary | ICD-10-CM

## 2016-04-12 DIAGNOSIS — I519 Heart disease, unspecified: Secondary | ICD-10-CM

## 2016-04-12 DIAGNOSIS — I5189 Other ill-defined heart diseases: Secondary | ICD-10-CM | POA: Diagnosis not present

## 2016-04-12 MED ORDER — POTASSIUM CHLORIDE CRYS ER 20 MEQ PO TBCR: 40.0000 meq | EXTENDED_RELEASE_TABLET | ORAL | 3 refills | Status: DC

## 2016-04-12 MED ORDER — TORSEMIDE 20 MG PO TABS: 20.0000 mg | ORAL_TABLET | ORAL | 3 refills | Status: DC

## 2016-04-12 NOTE — Patient Instructions (Signed)
Your physician recommends that you schedule a follow-up appointment in: 3 MONTHS WITH DR. BRANCH  Your physician has recommended you make the following change in your medication:   CHANGE TORSEMIDE 20 MG EVERY OTHER DAY  TAKE POTASSIUM ONLY ON DAYS WHEN YOU TAKE TORSEMIDE   Thank you for choosing [facility]!!

## 2016-04-12 NOTE — Progress Notes (Signed)
Clinical Summary [name] is a 80 y.o.male seen today for follow up of the following medical problems.This is a focused visit on his history of chronic RV failure and pulmonary HTN.     5. RV dysfunction/Pulmonary HTN - recent echo with severely dilated RV, moderate RV dysfunction. Moderate to severe TR with dilated TV anulus, PASP 35 by echo.  - echo 09/2012 personally reviewed. Mild to moderate RV enlargement, severe pulmonary HTN with PASP 73. Based on TR spectral Doppler waveform PASP may be underestimated - PFTs 10/2013 airway obstruction, severely decreased DLCO. CT chest 11/2012 with emphsymatous changes and granulomatous changes, ? Bibasilar fibrosis.  - on methotrexate for polymyalgia rheumatia - no snoring, no apneic episodes, some daytime fatigue.   - weight continues to trend down since last visit.  - can have some mild orthostatic symptoms.  - since last visit completed Peter Lopez 02/2016 mean PA 19, PCWP 10, 163 d/s.    Past Medical History:  Diagnosis Date  . 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 Benign essential tremor   . Exertional dyspnea   . Hyperlipidemia   . Hypertension   . Pacemaker 11/24/2012   Dr Peter Lopez  . Prostate cancer ([place])    S/P Prostatectomy  . Tobacco abuse    30 pack years; discontinued in 2000; currently one pack per week     Allergies  Allergen Reactions  . Rivaroxaban     Other reaction(s): Other (See Comments) Coughing up blood     Current Outpatient Prescriptions  Medication Sig Dispense Refill  . acetaminophen (TYLENOL) 500 MG tablet Take 1,000 mg by mouth every 6 (six) hours as needed for moderate pain.    Marland Kitchen amLODipine-olmesartan (AZOR) 5-40 MG per tablet Take 1 tablet by mouth daily.    Marland Kitchen aspirin 325 MG tablet Take 325 mg by mouth daily.    . Calcium Citrate-Vitamin D (CALCIUM + D PO) Take 1,200 mg by mouth daily.    . folic acid (FOLVITE) 546 MCG tablet Take 400 mcg by mouth daily.     Marland Kitchen HYDROcodone-acetaminophen (NORCO)  10-325 MG tablet Take 1 tablet by mouth every 4 (four) hours as needed. 42 tablet 0  . Leuprolide Acetate, 6 Month, (LUPRON DEPOT, 70-MONTH,) 45 MG injection Inject 45 mg into the muscle every 6 (six) months.    Marland Kitchen LINZESS 72 MCG capsule Take 1 capsule by mouth daily as needed for constipation.    . methotrexate (RHEUMATREX) 2.5 MG tablet Take 2.5 mg by mouth once a week. sunday    . potassium chloride SA (K-DUR,KLOR-CON) 20 MEQ tablet Take 40 mEq by mouth daily.     . propranolol (INDERAL) 40 MG tablet Take 40 mg by mouth daily.     . simvastatin (ZOCOR) 40 MG tablet Take 40 mg by mouth daily.      Marland Kitchen torsemide (DEMADEX) 20 MG tablet Take 1 tablet (20 mg total) by mouth daily. 180 tablet 3  . umeclidinium-vilanterol (ANORO ELLIPTA) 62.5-25 MCG/INH AEPB Inhale 1 puff into the lungs daily.     No current facility-administered medications for this visit.      Past Surgical History:  Procedure Laterality Date  . APPENDECTOMY    . CARDIAC CATHETERIZATION N/A 03/01/2016   Procedure: Right Heart Cath;  Surgeon: Peter Sine, MD;  Location: [facility];  Service: Cardiovascular;  Laterality: N/A;  . CATARACT EXTRACTION W/PHACO Left 05/03/2015   Procedure: CATARACT EXTRACTION PHACO AND INTRAOCULAR LENS PLACEMENT  LEFT EYE;  Surgeon: Rutherford Guys, MD;  Location: [facility];  Service: Ophthalmology;  Laterality: Left;  CDE:8.03  . COLONOSCOPY  2011  . HERNIA REPAIR    . INSERT / REPLACE / REMOVE PACEMAKER  11/24/2012   Dr Cristopher Peru  . KNEE SURGERY     Left laparoscopic knee surgery  . MENISCECTOMY     Right knee open meniscectomy  . PERMANENT PACEMAKER INSERTION N/A 11/24/2012   Procedure: PERMANENT PACEMAKER INSERTION;  Surgeon: Evans Lance, MD;  Location[facility] C[facility];  Service: Cardiovascular;  Laterality: N/A;  . ROBOT ASSISTED LAPAROSCOPIC RADICAL PROSTATECTOMY       Allergies  Allergen Reactions  . Rivaroxaban     Other reaction(s): Other (See Comments) Coughing up blood       Family History  Problem Relation Age of Onset  . Alzheimer's disease Mother 16  . Early death Father 90    trauma  . Heart failure Brother      Social History [name] reports that he quit smoking about 17 years ago. He smoked 1.00 pack per day. He has never used smokeless tobacco. [name] reports that he drinks alcohol.   Review of Systems CONSTITUTIONAL: No weight loss, fever, chills, weakness or fatigue.  HEENT: Eyes: No visual loss, blurred vision, double vision or yellow sclerae.No hearing loss, sneezing, congestion, runny nose or sore throat.  SKIN: No rash or itching.  CARDIOVASCULAR: per HPI RESPIRATORY: No shortness of breath, cough or sputum.  GASTROINTESTINAL: No anorexia, nausea, vomiting or diarrhea. No abdominal pain or blood.  GENITOURINARY: No burning on urination, no polyuria NEUROLOGICAL: No headache, dizziness, syncope, paralysis, ataxia, numbness or tingling in the extremities. No change in bowel or bladder control.  MUSCULOSKELETAL: No muscle, back pain, joint pain or stiffness.  LYMPHATICS: No enlarged nodes. No history of splenectomy.  PSYCHIATRIC: No history of depression or anxiety.  ENDOCRINOLOGIC: No reports of sweating, cold or heat intolerance. No polyuria or polydipsia.  Marland Kitchen   Physical Examination Vitals:   04/12/16 1415  BP: 129/66  Pulse: 77   Vitals:   04/12/16 1415  Weight: 120 lb 12.8 oz (54.8 kg)  Height: '5\' 9"'$  (1.753 m)    Gen: resting comfortably, no acute distress HEENT: no scleral icterus, pupils equal round and reactive, no palptable cervical adenopathy,  CV: RRR, no m/r/,g no jvd Resp: Clear to auscultation bilaterally GI: abdomen is soft, non-tender, non-distended, normal bowel sounds, no hepatosplenomegaly MSK: extremities are warm, no edema.  Skin: warm, no rash Neuro:  no focal deficits Psych: appropriate affect   Diagnostic Studies 02/2016 echo Study Conclusions  - Left ventricle: The cavity size was  normal. Wall thickness was  increased in a pattern of mild LVH. Systolic function was normal.  The estimated ejection fraction was in the range of 55% to 60%.  Wall motion was normal; there were no regional wall motion  abnormalities. The study is not technically sufficient to allow  evaluation of LV diastolic function. - Aortic valve: Mildly calcified annulus. Trileaflet; mildly  thickened leaflets. There was mild regurgitation. Valve area  (VTI): 2.1 cm^2. Valve area (Vmax): 1.79 cm^2. Valve area  (Vmean): 1.75 cm^2. - Mitral valve: Mildly calcified annulus. Mildly thickened leaflets  . There was mild regurgitation. - Left atrium: The atrium was severely dilated. - Right ventricle: The cavity size was severely dilated. Systolic  function was moderately reduced. TAPSE: 12 mm . - Right atrium: The atrium was massively dilated. - Tricuspid valve: There was  moderate-severe regurgitation. By  color Doppler TR appears moderate, the eccentricity of the jet  may lead to underestimation. There is systolic hepatic flow  reversal which suggests the TR is severe. - Pulmonary arteries: Systolic pressure was mildly increased. PA  peak pressure: 35 mm Hg (S). - Technically adequate study.  02/2016 RHC Right heart catheterization demonstrating relatively low right heart filling pressures and only very mild pulmonary artery systolic hypertension with a PA systolic pressure at 70-35 mmHg.  Under fluoroscopy, the right ventricle appeared significantly dilated.   Cardiac Output determination 4.4 and 2.7 by the thermodilution and Fick methods, with a cardiac index of 2.6 and 1.6 L/m/m, respectively.  Assessment and Plan   1. Pulmonary HTN/ RV systolic dysfunction/LV diastolic dysfunction - evidence of severe pulmonary HTN by echo 09/2012, PASP at least 73 and potentially underestimated based on TR waveform - recent echo shows worsening RV with severe RV dilatation, moderate dysfunction,  and moderate to severe TR with TV anular dilation. PASP 35, though potentially underestiamted due to decreased RV function and severity of TR - recent RHC with fairly normal pulmonary pressures, normal filling pressures - I suspect his RV dysfunction developed in setting of his COPD, LV diastolic dysfunction, and potentially in the past perhaps some component related to his autoimmune disease. Currently normal PA pressures - we will decrease his torsemide to '20mg'$  every other day, appears to be overdiuresed at this time, goal weight likely more in the mid 120s.     F/u 3 months   Arnoldo Lenis, M.D.

## 2016-05-04 ENCOUNTER — Ambulatory Visit (INDEPENDENT_AMBULATORY_CARE_PROVIDER_SITE_OTHER): Payer: PPO | Admitting: Orthopedic Surgery

## 2016-05-04 ENCOUNTER — Ambulatory Visit (INDEPENDENT_AMBULATORY_CARE_PROVIDER_SITE_OTHER): Payer: PPO

## 2016-05-04 ENCOUNTER — Encounter: Payer: Self-pay | Admitting: Orthopedic Surgery

## 2016-05-04 VITALS — BP 137/81 | HR 83 | Ht 68.0 in | Wt 120.0 lb

## 2016-05-04 DIAGNOSIS — S72001D Fracture of unspecified part of neck of right femur, subsequent encounter for closed fracture with routine healing: Secondary | ICD-10-CM | POA: Diagnosis not present

## 2016-05-04 NOTE — Patient Instructions (Signed)
Return as needed

## 2016-05-04 NOTE — Progress Notes (Signed)
Patient ID: Peter Lopez, male   DOB: 1936-07-09, 80 y.o.   MRN: [id number]  Chief Complaint  Patient presents with  . Follow-up    right hip fracture, doi 03/12/16    HPI Peter Lopez is a 80 y.o. male.  HPI Fracture care follow-up right greater trochanter fracture seen on CT scan patient has been treated with walker for 6 weeks here for six-week x-ray  Review of Systems Review of Systems  Chest mass ? 6 months prominence over the sternum , no pain    Physical Exam BP 137/81   Pulse 83   Ht 5' 8"  (1.727 m)   Wt 120 lb (54.4 kg)   BMI 18.25 kg/m    Mild tenderness right gr trochanter   Prominence sternum  xrays show protrusion from chest into bone from lungs   Will see cardilogy in 2 weeks to review    Encounter Diagnosis  Name Primary?  . Closed right hip fracture, with routine healing, subsequent encounter Yes   Ret as needed

## 2016-05-14 DIAGNOSIS — M545 Low back pain: Secondary | ICD-10-CM | POA: Diagnosis not present

## 2016-05-14 DIAGNOSIS — M9902 Segmental and somatic dysfunction of thoracic region: Secondary | ICD-10-CM | POA: Diagnosis not present

## 2016-05-14 DIAGNOSIS — M9903 Segmental and somatic dysfunction of lumbar region: Secondary | ICD-10-CM | POA: Diagnosis not present

## 2016-05-14 DIAGNOSIS — M9905 Segmental and somatic dysfunction of pelvic region: Secondary | ICD-10-CM | POA: Diagnosis not present

## 2016-05-17 DIAGNOSIS — M9905 Segmental and somatic dysfunction of pelvic region: Secondary | ICD-10-CM | POA: Diagnosis not present

## 2016-05-17 DIAGNOSIS — M9902 Segmental and somatic dysfunction of thoracic region: Secondary | ICD-10-CM | POA: Diagnosis not present

## 2016-05-17 DIAGNOSIS — M9903 Segmental and somatic dysfunction of lumbar region: Secondary | ICD-10-CM | POA: Diagnosis not present

## 2016-05-17 DIAGNOSIS — M545 Low back pain: Secondary | ICD-10-CM | POA: Diagnosis not present

## 2016-06-08 DIAGNOSIS — I5032 Chronic diastolic (congestive) heart failure: Secondary | ICD-10-CM | POA: Diagnosis not present

## 2016-06-08 DIAGNOSIS — I10 Essential (primary) hypertension: Secondary | ICD-10-CM | POA: Diagnosis not present

## 2016-06-08 DIAGNOSIS — J449 Chronic obstructive pulmonary disease, unspecified: Secondary | ICD-10-CM | POA: Diagnosis not present

## 2016-06-08 DIAGNOSIS — R739 Hyperglycemia, unspecified: Secondary | ICD-10-CM | POA: Diagnosis not present

## 2016-06-08 DIAGNOSIS — Z125 Encounter for screening for malignant neoplasm of prostate: Secondary | ICD-10-CM | POA: Diagnosis not present

## 2016-06-11 ENCOUNTER — Ambulatory Visit (INDEPENDENT_AMBULATORY_CARE_PROVIDER_SITE_OTHER): Payer: PPO | Admitting: *Deleted

## 2016-06-11 DIAGNOSIS — I495 Sick sinus syndrome: Secondary | ICD-10-CM | POA: Diagnosis not present

## 2016-06-11 NOTE — Progress Notes (Signed)
Remote pacemaker transmission.   

## 2016-06-12 ENCOUNTER — Encounter: Payer: Self-pay | Admitting: Cardiology

## 2016-06-12 NOTE — Progress Notes (Signed)
Letter  

## 2016-06-14 DIAGNOSIS — M9902 Segmental and somatic dysfunction of thoracic region: Secondary | ICD-10-CM | POA: Diagnosis not present

## 2016-06-14 DIAGNOSIS — M9905 Segmental and somatic dysfunction of pelvic region: Secondary | ICD-10-CM | POA: Diagnosis not present

## 2016-06-14 DIAGNOSIS — M9903 Segmental and somatic dysfunction of lumbar region: Secondary | ICD-10-CM | POA: Diagnosis not present

## 2016-06-14 DIAGNOSIS — M545 Low back pain: Secondary | ICD-10-CM | POA: Diagnosis not present

## 2016-06-26 DIAGNOSIS — H2511 Age-related nuclear cataract, right eye: Secondary | ICD-10-CM | POA: Diagnosis not present

## 2016-06-26 DIAGNOSIS — Z961 Presence of intraocular lens: Secondary | ICD-10-CM | POA: Diagnosis not present

## 2016-07-02 DIAGNOSIS — M810 Age-related osteoporosis without current pathological fracture: Secondary | ICD-10-CM | POA: Diagnosis not present

## 2016-07-02 DIAGNOSIS — C61 Malignant neoplasm of prostate: Secondary | ICD-10-CM | POA: Diagnosis not present

## 2016-07-10 LAB — CUP PACEART REMOTE DEVICE CHECK
Battery Remaining Longevity: 62 mo
Battery Voltage: 2.77 V
Implantable Lead Implant Date: 20140407
Implantable Lead Location: 753860
Implantable Lead Model: 5076
Implantable Pulse Generator Implant Date: 20140407
Lead Channel Pacing Threshold Amplitude: 0.75 V
Lead Channel Pacing Threshold Pulse Width: 0.4 ms
Lead Channel Setting Pacing Amplitude: 2.5 V
MDC IDC MSMT BATTERY IMPEDANCE: 909 Ohm
MDC IDC MSMT LEADCHNL RA IMPEDANCE VALUE: 0 Ohm
MDC IDC MSMT LEADCHNL RV IMPEDANCE VALUE: 630 Ohm
MDC IDC SESS DTM: 20171023085216
MDC IDC SET LEADCHNL RV PACING PULSEWIDTH: 0.4 ms
MDC IDC SET LEADCHNL RV SENSING SENSITIVITY: 2.8 mV
MDC IDC STAT BRADY RV PERCENT PACED: 99 %

## 2016-07-17 ENCOUNTER — Ambulatory Visit (INDEPENDENT_AMBULATORY_CARE_PROVIDER_SITE_OTHER): Payer: PPO | Admitting: Cardiology

## 2016-07-17 ENCOUNTER — Encounter: Payer: Self-pay | Admitting: *Deleted

## 2016-07-17 ENCOUNTER — Encounter: Payer: Self-pay | Admitting: Cardiology

## 2016-07-17 VITALS — BP 149/77 | HR 79 | Ht 69.0 in | Wt 127.2 lb

## 2016-07-17 DIAGNOSIS — I519 Heart disease, unspecified: Secondary | ICD-10-CM

## 2016-07-17 DIAGNOSIS — I10 Essential (primary) hypertension: Secondary | ICD-10-CM

## 2016-07-17 DIAGNOSIS — I482 Chronic atrial fibrillation, unspecified: Secondary | ICD-10-CM

## 2016-07-17 DIAGNOSIS — I5032 Chronic diastolic (congestive) heart failure: Secondary | ICD-10-CM

## 2016-07-17 DIAGNOSIS — I272 Pulmonary hypertension, unspecified: Secondary | ICD-10-CM | POA: Diagnosis not present

## 2016-07-17 DIAGNOSIS — E782 Mixed hyperlipidemia: Secondary | ICD-10-CM

## 2016-07-17 NOTE — Patient Instructions (Signed)

## 2016-07-17 NOTE — Progress Notes (Signed)
Clinical Summary Peter Lopez is a 80 y.o.male seen today for follow up of the following medical problems.   1. RV dysfunction/Pulmonary HTN/Chronic LV diastolic dysfunction - recent echo with severely dilated RV, moderate RV dysfunction. Moderate to severe TR with dilated TV anulus, PASP 35 by echo.  - echo 09/2012 personally reviewed. Mild to moderate RV enlargement, severe pulmonary HTN with PASP 73. Based on TR spectral Doppler waveform PASP may be underestimated - PFTs 10/2013 airway obstruction, severely decreased DLCO. CT chest 11/2012 with emphsymatous changes and granulomatous changes, ? Bibasilar fibrosis.  - on methotrexate for polymyalgia rheumatia - no snoring, no apneic episodes, some daytime fatigue.   completed RHC 02/2016 mean PA 19, PCWP 10, 163 d/s.   - last visit we changed his torsemie to '20mg'$  MWF.  - no recent edema. No recent orthostatic symptoms.    2. Symptomatic bradycardia - has permanent pacemaker follows by Dr Lovena Le - no recent lightheadness or dizziness.    3. Chronic afib - has not been on anticoag due to previous side effects according to previous notes.  - no recent palpitations.     4. HTN - home bp's typically around 100/50s, most often around 120s/60-70s.  - compliant with meds  5. Hyperlipidemia - compliant with statin.  Past Medical History:  Diagnosis Date  . 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 Benign essential tremor   . Exertional dyspnea   . Hyperlipidemia   . Hypertension   . Pacemaker 11/24/2012   Dr Lovena Le  . Prostate cancer ([place])    S/P Prostatectomy  . Tobacco abuse    30 pack years; discontinued in 2000; currently one pack per week     Allergies  Allergen Reactions  . Rivaroxaban     Other reaction(s): Other (See Comments) Coughing up blood     Current Outpatient Prescriptions  Medication Sig Dispense Refill  . acetaminophen (TYLENOL) 500 MG tablet Take 1,000 mg by mouth every 6 (six) hours as needed  for moderate pain.    Marland Kitchen amLODipine-olmesartan (AZOR) 5-40 MG per tablet Take 1 tablet by mouth daily.    Marland Kitchen aspirin 325 MG tablet Take 325 mg by mouth daily.    . Calcium Citrate-Vitamin D (CALCIUM + D PO) Take 1,200 mg by mouth daily.    . folic acid (FOLVITE) 196 MCG tablet Take 400 mcg by mouth daily.     Marland Kitchen HYDROcodone-acetaminophen (NORCO) 10-325 MG tablet Take 1 tablet by mouth every 4 (four) hours as needed. 42 tablet 0  . Leuprolide Acetate, 6 Month, (LUPRON DEPOT, 44-MONTH,) 45 MG injection Inject 45 mg into the muscle every 6 (six) months.    Marland Kitchen LINZESS 72 MCG capsule Take 1 capsule by mouth daily as needed for constipation.    . methotrexate (RHEUMATREX) 2.5 MG tablet Take 2.5 mg by mouth once a week. sunday    . potassium chloride SA (K-DUR,KLOR-CON) 20 MEQ tablet Take 2 tablets (40 mEq total) by mouth every other day. 90 tablet 3  . propranolol (INDERAL) 40 MG tablet Take 40 mg by mouth daily.     . simvastatin (ZOCOR) 40 MG tablet Take 40 mg by mouth daily.      Marland Kitchen torsemide (DEMADEX) 20 MG tablet Take 1 tablet (20 mg total) by mouth every other day. 45 tablet 3  . umeclidinium-vilanterol (ANORO ELLIPTA) 62.5-25 MCG/INH AEPB Inhale 1 puff into the lungs daily.     No current facility-administered medications for this  visit.      Past Surgical History:  Procedure Laterality Date  . APPENDECTOMY    . CARDIAC CATHETERIZATION N/A 03/01/2016   Procedure: Right Heart Cath;  Surgeon: Troy Sine, MD;  Location: [facility];  Service: Cardiovascular;  Laterality: N/A;  . CATARACT EXTRACTION W/PHACO Left 05/03/2015   Procedure: CATARACT EXTRACTION PHACO AND INTRAOCULAR LENS PLACEMENT LEFT EYE;  Surgeon: Rutherford Guys, MD;  Location: [facility];  Service: Ophthalmology;  Laterality: Left;  CDE:8.03  . COLONOSCOPY  2011  . HERNIA REPAIR    . INSERT / REPLACE / REMOVE PACEMAKER  11/24/2012   Dr Cristopher Peru  . KNEE SURGERY     Left laparoscopic knee surgery  . MENISCECTOMY     Right  knee open meniscectomy  . PERMANENT PACEMAKER INSERTION N/A 11/24/2012   Procedure: PERMANENT PACEMAKER INSERTION;  Surgeon: Evans Lance, MD;  Location: [facility] C[facility];  Service: Cardiovascular;  Laterality: N/A;  . ROBOT ASSISTED LAPAROSCOPIC RADICAL PROSTATECTOMY       Allergies  Allergen Reactions  . Rivaroxaban     Other reaction(s): Other (See Comments) Coughing up blood      Family History  Problem Relation Age of Onset  . Alzheimer's disease Mother 8  . Early death Father 23    trauma  . Heart failure Brother      Social History Peter Lopez reports that he has been smoking.  He has been smoking about 1.00 pack per day. He has never used smokeless tobacco. Peter Lopez reports that he drinks alcohol.   Review of Systems CONSTITUTIONAL: No weight loss, fever, chills, weakness or fatigue.  HEENT: Eyes: No visual loss, blurred vision, double vision or yellow sclerae.No hearing loss, sneezing, congestion, runny nose or sore throat.  SKIN: No rash or itching.  CARDIOVASCULAR: per HPI RESPIRATORY: No shortness of breath, cough or sputum.  GASTROINTESTINAL: No anorexia, nausea, vomiting or diarrhea. No abdominal pain or blood.  GENITOURINARY: No burning on urination, no polyuria NEUROLOGICAL: No headache, dizziness, syncope, paralysis, ataxia, numbness or tingling in the extremities. No change in bowel or bladder control.  MUSCULOSKELETAL: No muscle, back pain, joint pain or stiffness.  LYMPHATICS: No enlarged nodes. No history of splenectomy.  PSYCHIATRIC: No history of depression or anxiety.  ENDOCRINOLOGIC: No reports of sweating, cold or heat intolerance. No polyuria or polydipsia.  Marland Kitchen   Physical Examination Vitals:   07/17/16 1149  BP: (!) 149/77  Pulse: 79   Vitals:   07/17/16 1149  Weight: 127 lb 3.2 oz (57.7 kg)  Height: '5\' 9"'$  (1.753 m)    Gen: resting comfortably, no acute distress HEENT: no scleral icterus, pupils equal round and reactive, no palptable  cervical adenopathy,  CV: RRR, no m/r/g, no jvd Resp: Clear to auscultation bilaterally GI: abdomen is soft, non-tender, non-distended, normal bowel sounds, no hepatosplenomegaly MSK: extremities are warm, no edema.  Skin: warm, no rash Neuro:  no focal deficits Psych: appropriate affect   Diagnostic Studies  02/2016 echo Study Conclusions  - Left ventricle: The cavity size was normal. Wall thickness was  increased in a pattern of mild LVH. Systolic function was normal.  The estimated ejection fraction was in the range of 55% to 60%.  Wall motion was normal; there were no regional wall motion  abnormalities. The study is not technically sufficient to allow  evaluation of LV diastolic function. - Aortic valve: Mildly calcified annulus. Trileaflet; mildly  thickened leaflets. There was mild regurgitation. Valve area  (VTI):  2.1 cm^2. Valve area (Vmax): 1.79 cm^2. Valve area  (Vmean): 1.75 cm^2. - Mitral valve: Mildly calcified annulus. Mildly thickened leaflets  . There was mild regurgitation. - Left atrium: The atrium was severely dilated. - Right ventricle: The cavity size was severely dilated. Systolic  function was moderately reduced. TAPSE: 12 mm . - Right atrium: The atrium was massively dilated. - Tricuspid valve: There was moderate-severe regurgitation. By  color Doppler TR appears moderate, the eccentricity of the jet  may lead to underestimation. There is systolic hepatic flow  reversal which suggests the TR is severe. - Pulmonary arteries: Systolic pressure was mildly increased. PA  peak pressure: 35 mm Hg (S). - Technically adequate study.  02/2016 RHC Right heart catheterization demonstrating relatively low right heart filling pressures and only very mild pulmonary artery systolic hypertension with a PA systolic pressure at 16-94 mmHg. Under fluoroscopy, the right ventricle appeared significantly dilated.   Cardiac Output determination 4.4 and 2.7  by the thermodilution and Fick methods, with a cardiac index of 2.6 and 1.6 L/m/m, respectively.   Assessment and Plan   1. Pulmonary HTN/ RV systolic dysfunction/LV diastolic dysfunction - evidence of severe pulmonary HTN by echo 09/2012, PASP at least 73 and potentially underestimated based on TR waveform - recent echo shows worsening RV with severe RV dilatation, moderate dysfunction, and moderate to severe TR with TV anular dilation. PASP 35, though potentially underestiamted due to decreased RV function and severity of TR - recent RHC with fairly normal pulmonary pressures, normal filling pressures - I suspect his RV dysfunction developed in setting of his COPD, LV diastolic dysfunction, and potentially in the past perhaps some component related to his autoimmune disease. Currently normal PA pressures  - currently doing well, we will continue current diuretics.    2. Symptomatic bradycardia - no current symptoms, pacemaker followed by EP   3. Chronic afib - no recent symptoms. Has not been on anticoag in the past due to adverse reaction, I continue to defer to EP who has followed patient for several years  4. Chronic diastolic HF - he appears euvolemic, we will continue current diuretic  5. Hyperlipidemia - continue statin, request labs from pcp  6. HTN - at goal, continue current meds   F/u 6 months. Request labs from pcp     Arnoldo Lenis, M.D.

## 2016-07-18 DIAGNOSIS — H2511 Age-related nuclear cataract, right eye: Secondary | ICD-10-CM | POA: Diagnosis not present

## 2016-08-28 DIAGNOSIS — H538 Other visual disturbances: Secondary | ICD-10-CM | POA: Diagnosis not present

## 2016-08-29 ENCOUNTER — Ambulatory Visit (HOSPITAL_COMMUNITY)
Admission: RE | Admit: 2016-08-29 | Discharge: 2016-08-29 | Disposition: A | Discharge status: Home / Self Care | Payer: PPO | Source: Ambulatory Visit | Attending: Pulmonary Disease | Admitting: Pulmonary Disease

## 2016-08-29 ENCOUNTER — Other Ambulatory Visit (HOSPITAL_COMMUNITY): Payer: Self-pay | Admitting: Pulmonary Disease

## 2016-08-29 DIAGNOSIS — H3413 Central retinal artery occlusion, bilateral: Secondary | ICD-10-CM

## 2016-08-29 DIAGNOSIS — I6523 Occlusion and stenosis of bilateral carotid arteries: Secondary | ICD-10-CM | POA: Diagnosis not present

## 2016-08-29 DIAGNOSIS — H3410 Central retinal artery occlusion, unspecified eye: Secondary | ICD-10-CM | POA: Insufficient documentation

## 2016-08-29 DIAGNOSIS — G453 Amaurosis fugax: Secondary | ICD-10-CM | POA: Diagnosis not present

## 2016-08-29 DIAGNOSIS — H3412 Central retinal artery occlusion, left eye: Secondary | ICD-10-CM | POA: Diagnosis not present

## 2016-09-10 ENCOUNTER — Encounter: Payer: PPO | Admitting: *Deleted

## 2016-09-10 ENCOUNTER — Telehealth: Payer: Self-pay | Admitting: Cardiology

## 2016-09-10 NOTE — Telephone Encounter (Signed)
LMOVM reminding pt to send remote transmission.   

## 2016-09-14 ENCOUNTER — Encounter: Payer: Self-pay | Admitting: Cardiology

## 2016-09-17 DIAGNOSIS — H3412 Central retinal artery occlusion, left eye: Secondary | ICD-10-CM | POA: Diagnosis not present

## 2016-09-17 DIAGNOSIS — G453 Amaurosis fugax: Secondary | ICD-10-CM | POA: Diagnosis not present

## 2016-10-04 DIAGNOSIS — M9903 Segmental and somatic dysfunction of lumbar region: Secondary | ICD-10-CM | POA: Diagnosis not present

## 2016-10-04 DIAGNOSIS — M9901 Segmental and somatic dysfunction of cervical region: Secondary | ICD-10-CM | POA: Diagnosis not present

## 2016-10-04 DIAGNOSIS — M9905 Segmental and somatic dysfunction of pelvic region: Secondary | ICD-10-CM | POA: Diagnosis not present

## 2016-10-04 DIAGNOSIS — M9902 Segmental and somatic dysfunction of thoracic region: Secondary | ICD-10-CM | POA: Diagnosis not present

## 2016-10-04 DIAGNOSIS — M545 Low back pain: Secondary | ICD-10-CM | POA: Diagnosis not present

## 2016-10-12 ENCOUNTER — Encounter: Payer: Self-pay | Admitting: Cardiology

## 2016-10-12 NOTE — Progress Notes (Signed)
Letter  

## 2016-10-23 DIAGNOSIS — H5210 Myopia, unspecified eye: Secondary | ICD-10-CM | POA: Diagnosis not present

## 2016-10-23 DIAGNOSIS — G453 Amaurosis fugax: Secondary | ICD-10-CM | POA: Diagnosis not present

## 2016-10-23 DIAGNOSIS — H3412 Central retinal artery occlusion, left eye: Secondary | ICD-10-CM | POA: Diagnosis not present

## 2016-10-23 DIAGNOSIS — Z961 Presence of intraocular lens: Secondary | ICD-10-CM | POA: Diagnosis not present

## 2016-11-07 DIAGNOSIS — L570 Actinic keratosis: Secondary | ICD-10-CM | POA: Diagnosis not present

## 2016-11-07 DIAGNOSIS — X32XXXD Exposure to sunlight, subsequent encounter: Secondary | ICD-10-CM | POA: Diagnosis not present

## 2016-11-07 DIAGNOSIS — L281 Prurigo nodularis: Secondary | ICD-10-CM | POA: Diagnosis not present

## 2016-11-07 DIAGNOSIS — L308 Other specified dermatitis: Secondary | ICD-10-CM | POA: Diagnosis not present

## 2016-12-28 DIAGNOSIS — I4891 Unspecified atrial fibrillation: Secondary | ICD-10-CM | POA: Diagnosis not present

## 2016-12-28 DIAGNOSIS — I10 Essential (primary) hypertension: Secondary | ICD-10-CM | POA: Diagnosis not present

## 2016-12-28 DIAGNOSIS — I5032 Chronic diastolic (congestive) heart failure: Secondary | ICD-10-CM | POA: Diagnosis not present

## 2016-12-28 DIAGNOSIS — R259 Unspecified abnormal involuntary movements: Secondary | ICD-10-CM | POA: Diagnosis not present

## 2016-12-28 DIAGNOSIS — R739 Hyperglycemia, unspecified: Secondary | ICD-10-CM | POA: Diagnosis not present

## 2016-12-28 DIAGNOSIS — N401 Enlarged prostate with lower urinary tract symptoms: Secondary | ICD-10-CM | POA: Diagnosis not present

## 2016-12-28 DIAGNOSIS — J449 Chronic obstructive pulmonary disease, unspecified: Secondary | ICD-10-CM | POA: Diagnosis not present

## 2017-01-16 DIAGNOSIS — M818 Other osteoporosis without current pathological fracture: Secondary | ICD-10-CM | POA: Diagnosis not present

## 2017-01-16 DIAGNOSIS — C61 Malignant neoplasm of prostate: Secondary | ICD-10-CM | POA: Diagnosis not present

## 2017-01-16 DIAGNOSIS — T50905A Adverse effect of unspecified drugs, medicaments and biological substances, initial encounter: Secondary | ICD-10-CM | POA: Diagnosis not present

## 2017-01-24 ENCOUNTER — Encounter: Payer: Self-pay | Admitting: Internal Medicine

## 2017-01-24 ENCOUNTER — Ambulatory Visit (INDEPENDENT_AMBULATORY_CARE_PROVIDER_SITE_OTHER): Payer: PPO | Admitting: Internal Medicine

## 2017-01-24 VITALS — BP 132/74 | HR 87 | Ht 69.0 in | Wt 132.0 lb

## 2017-01-24 DIAGNOSIS — I482 Chronic atrial fibrillation, unspecified: Secondary | ICD-10-CM

## 2017-01-24 DIAGNOSIS — Z950 Presence of cardiac pacemaker: Secondary | ICD-10-CM

## 2017-01-24 DIAGNOSIS — I5032 Chronic diastolic (congestive) heart failure: Secondary | ICD-10-CM | POA: Diagnosis not present

## 2017-01-24 NOTE — Patient Instructions (Signed)
Your physician wants you to follow-up in: 1 Year with Dr. Lovena Le. You will receive a reminder letter in the mail two months in advance. If you don't receive a letter, please call our office to schedule the follow-up appointment.  Remote monitoring is used to monitor your Pacemaker of ICD from home. This monitoring reduces the number of office visits required to check your device to one time per year. It allows [place] to keep an eye on the functioning of your device to ensure it is working properly. You are scheduled for a device check from home on 04/25/17. You may send your transmission at any time that day. If you have a wireless device, the transmission will be sent automatically. After your physician reviews your transmission, you will receive a postcard with your next transmission date.  If you need a refill on your cardiac medications before your next appointment, please call your pharmacy.  Thank you for choosing [facility]!

## 2017-01-24 NOTE — Progress Notes (Signed)
HPI  [name] returns today for followup. He is a very pleasant 81 year old man with symptomatic bradycardia due to CHB, status post permanent pacemaker insertion, chronic atrial fibrillation, and chronic anticoagulation. The patient noted that on systemic anticoagulation he had hemoptysis.  He denies peripheral edema, chest pain, or shortness of breath. His weight has improved.   Allergies  Allergen Reactions  . Rivaroxaban     Other reaction(s): Other (See Comments) Coughing up blood     Current Outpatient Prescriptions  Medication Sig Dispense Refill  . acetaminophen (TYLENOL) 500 MG tablet Take 1,000 mg by mouth every 6 (six) hours as needed for moderate pain.    Marland Kitchen amLODipine-olmesartan (AZOR) 5-40 MG per tablet Take 1 tablet by mouth daily.    Marland Kitchen aspirin 325 MG tablet Take 325 mg by mouth daily.    . Calcium Citrate-Vitamin D (CALCIUM + D PO) Take 1,200 mg by mouth daily.    . folic acid (FOLVITE) 027 MCG tablet Take 400 mcg by mouth daily.     Marland Kitchen Leuprolide Acetate, 6 Month, (LUPRON DEPOT, 36-MONTH,) 45 MG injection Inject 45 mg into the muscle every 6 (six) months.    . methotrexate (RHEUMATREX) 2.5 MG tablet Take 2.5 mg by mouth once a week. sunday    . potassium chloride SA (K-DUR,KLOR-CON) 20 MEQ tablet Take 2 tablets (40 mEq total) by mouth every other day. (Patient taking differently: Take 40 mEq by mouth every other day. Monday, Wednesday, Friday) 90 tablet 3  . propranolol (INDERAL) 40 MG tablet Take 40 mg by mouth daily.     . simvastatin (ZOCOR) 40 MG tablet Take 40 mg by mouth daily.      Marland Kitchen torsemide (DEMADEX) 20 MG tablet Take 1 tablet (20 mg total) by mouth every other day. (Patient taking differently: Take 20 mg by mouth every other day. Monday, Wednesday, & Friday per patient) 45 tablet 3   No current facility-administered medications for this visit.      Past Medical History:  Diagnosis Date  . 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 Benign essential tremor   .  Exertional dyspnea   . Hyperlipidemia   . Hypertension   . Pacemaker 11/24/2012   Dr Lovena Le  . Prostate cancer ([place])    S/P Prostatectomy  . Tobacco abuse    30 pack years; discontinued in 2000; currently one pack per week    ROS:   All systems reviewed and negative except as noted in the HPI.   Past Surgical History:  Procedure Laterality Date  . APPENDECTOMY    . CARDIAC CATHETERIZATION N/A 03/01/2016   Procedure: Right Heart Cath;  Surgeon: Troy Sine, MD;  Location: [facility];  Service: Cardiovascular;  Laterality: N/A;  . CATARACT EXTRACTION W/PHACO Left 05/03/2015   Procedure: CATARACT EXTRACTION PHACO AND INTRAOCULAR LENS PLACEMENT LEFT EYE;  Surgeon: Rutherford Guys, MD;  Location: [facility];  Service: Ophthalmology;  Laterality: Left;  CDE:8.03  . COLONOSCOPY  2011  . HERNIA REPAIR    . INSERT / REPLACE / REMOVE PACEMAKER  11/24/2012   Dr Cristopher Peru  . KNEE SURGERY     Left laparoscopic knee surgery  . MENISCECTOMY     Right knee open meniscectomy  . PERMANENT PACEMAKER INSERTION N/A 11/24/2012   Procedure: PERMANENT PACEMAKER INSERTION;  Surgeon: Evans Lance, MD;  Location: [facility];  Service: Cardiovascular;  Laterality: N/A;  . ROBOT ASSISTED LAPAROSCOPIC RADICAL PROSTATECTOMY  Family History  Problem Relation Age of Onset  . Alzheimer's disease Mother 68  . Early death Father 53       trauma  . Heart failure Brother      Social History   Social History  . Marital status: Married    Spouse name: N/A  . Number of children: N/A  . Years of education: N/A   Occupational History  . Runner, broadcasting/film/video    Social History Main Topics  . Smoking status: Former Smoker    Packs/day: 1.00    Quit date: 12/11/1998  . Smokeless tobacco: Never Used  . Alcohol use 0.0 oz/week     Comment: modest  . Drug use: No  . Sexual activity: Not on file   Other Topics Concern  . Not on file   Social History Narrative  . No narrative on file      BP 152/80, P 78, R - 18 Physical Exam:  Well appearing 81 year old man,NAD HEENT: Unremarkable Neck:  6 cm JVD, no thyromegally Lungs:  Clear with no wheezes, rales, or rhonchi. HEART:  Regular rate rhythm, no murmurs, no rubs, no clicks Abd:  soft, positive bowel sounds, no organomegally, no rebound, no guarding Ext:  2 plus pulses, no edema, no cyanosis, no clubbing Skin:  No rashes no nodules Neuro:  CN II through XII intact, motor grossly intact  DEVICE  Normal device function.  See PaceArt for details.   Assess/Plan: 1. Chronic diastolic heart failure - his symptoms are well controlled. I asked him to reduce his sodium intake. 2. Atrial fib - his ventricular rate is well controlled. He had hemoptysis on systemic anti-coagulation and this was stopped. His hemoptysis resolved. 3. PPM - her Medtronic VVI PM is working normally. Will follow. He is pacing now 98% of the time. 4. Hemoptysis - none since stopping his anti-coagulation.  Mikle Bosworth.D.

## 2017-01-30 ENCOUNTER — Ambulatory Visit (INDEPENDENT_AMBULATORY_CARE_PROVIDER_SITE_OTHER): Payer: PPO | Admitting: Cardiology

## 2017-01-30 ENCOUNTER — Encounter: Payer: Self-pay | Admitting: Cardiology

## 2017-01-30 VITALS — BP 132/64 | HR 73 | Ht 69.0 in | Wt 133.4 lb

## 2017-01-30 DIAGNOSIS — I272 Pulmonary hypertension, unspecified: Secondary | ICD-10-CM | POA: Diagnosis not present

## 2017-01-30 DIAGNOSIS — I10 Essential (primary) hypertension: Secondary | ICD-10-CM

## 2017-01-30 DIAGNOSIS — I5032 Chronic diastolic (congestive) heart failure: Secondary | ICD-10-CM

## 2017-01-30 DIAGNOSIS — I4891 Unspecified atrial fibrillation: Secondary | ICD-10-CM | POA: Diagnosis not present

## 2017-01-30 LAB — CUP PACEART INCLINIC DEVICE CHECK
Battery Remaining Longevity: 54 mo
Brady Statistic RV Percent Paced: 99 %
Date Time Interrogation Session: 20180607121154
Implantable Lead Location: 753860
Lead Channel Impedance Value: 0 Ohm
Lead Channel Impedance Value: 648 Ohm
Lead Channel Pacing Threshold Amplitude: 0.75 V
Lead Channel Pacing Threshold Pulse Width: 0.4 ms
MDC IDC LEAD IMPLANT DT: 20140407
MDC IDC MSMT BATTERY IMPEDANCE: 1146 Ohm
MDC IDC MSMT BATTERY VOLTAGE: 2.77 V
MDC IDC MSMT LEADCHNL RV PACING THRESHOLD AMPLITUDE: 0.875 V
MDC IDC MSMT LEADCHNL RV PACING THRESHOLD PULSEWIDTH: 0.4 ms
MDC IDC PG IMPLANT DT: 20140407
MDC IDC SET LEADCHNL RV PACING AMPLITUDE: 2.5 V
MDC IDC SET LEADCHNL RV PACING PULSEWIDTH: 0.4 ms
MDC IDC SET LEADCHNL RV SENSING SENSITIVITY: 2.8 mV

## 2017-01-30 NOTE — Patient Instructions (Signed)
Medication Instructions:  Your physician recommends that you continue on your current medications as directed. Please refer to the Current Medication list given to you today.   Labwork: I WILL REQUEST A COPY OF YOUR RECENT LABS FROM PCP  Testing/Procedures: NONE  Follow-Up: Your physician wants you to follow-up in: 6 MONTHS.  You will receive a reminder letter in the mail two months in advance. If you don't receive a letter, please call our office to schedule the follow-up appointment.   Any Other Special Instructions Will Be Listed Below (If Applicable).     If you need a refill on your cardiac medications before your next appointment, please call your pharmacy.

## 2017-01-30 NOTE — Progress Notes (Signed)
Clinical Summary [name] is a 81 y.o.male seen today for follow up of the following medical problems.   1. RV dysfunction/Pulmonary HTN/Chronic LV diastolic dysfunction - recent echo with severely dilated RV, moderate RV dysfunction. Moderate to severe TR with dilated TV anulus, PASP 35 by echo.  - echo 09/2012 personally reviewed. Mild to moderate RV enlargement, severe pulmonary HTN with PASP 73. Based on TR spectral Doppler waveform PASP may be underestimated - PFTs 10/2013 airway obstruction, severely decreased DLCO. CT chest 11/2012 with emphsymatous changes and granulomatous changes, ? Bibasilar fibrosis.  - on methotrexate for polymyalgia rheumatia - no snoring, no apneic episodes, some daytime fatigue.   completed RHC 02/2016 mean PA 19, PCWP 10, 163 d/s.   - we previously changed his torsemie to 20mg  MWF.    - no recent SOB/DOE. No recent edema - Home weights 127 lb. Takes Torsemide 20mg  MWF. Limiting sodium intake.    2. Symptomatic bradycardia - has permanent pacemaker followed by Dr Lovena Le  - normal pacemaker at last check 01/24/17.No recent symptoms   3. Chronic afib - has not been on anticoag due to previous side effects according to previous notes, reportedly hemoptysis.   - no recent symptoms.  4. HTN - compliant with meds  5. Hyperlipidemia - compliant with statin.    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  Past Medical History:  Diagnosis Date  . 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 Benign essential tremor   . Exertional dyspnea   . Hyperlipidemia   . Hypertension   . Pacemaker 11/24/2012   Dr Lovena Le  . Prostate cancer ([place])    S/P Prostatectomy  . Tobacco abuse    30 pack years; discontinued in 2000; currently one pack per week     Allergies  Allergen Reactions  . Rivaroxaban     Other reaction(s): Other (See Comments) Coughing up blood     Current Outpatient Prescriptions  Medication  Sig Dispense Refill  . acetaminophen (TYLENOL) 500 MG tablet Take 1,000 mg by mouth every 6 (six) hours as needed for moderate pain.    Marland Kitchen amLODipine-olmesartan (AZOR) 5-40 MG per tablet Take 1 tablet by mouth daily.    Marland Kitchen aspirin 325 MG tablet Take 325 mg by mouth daily.    . Calcium Citrate-Vitamin D (CALCIUM + D PO) Take 1,200 mg by mouth daily.    . folic acid (FOLVITE) 197 MCG tablet Take 400 mcg by mouth daily.     Marland Kitchen Leuprolide Acetate, 6 Month, (LUPRON DEPOT, 29-MONTH,) 45 MG injection Inject 45 mg into the muscle every 6 (six) months.    . methotrexate (RHEUMATREX) 2.5 MG tablet Take 2.5 mg by mouth once a week. sunday    . potassium chloride SA (K-DUR,KLOR-CON) 20 MEQ tablet Take 2 tablets (40 mEq total) by mouth every other day. (Patient taking differently: Take 40 mEq by mouth every other day. Monday, Wednesday, Friday) 90 tablet 3  . propranolol (INDERAL) 40 MG tablet Take 40 mg by mouth daily.     . simvastatin (ZOCOR) 40 MG tablet Take 40 mg by mouth daily.      Marland Kitchen torsemide (DEMADEX) 20 MG tablet Take 1 tablet (20 mg total) by mouth every other day. (Patient taking differently: Take 20 mg by mouth every other day. Monday, Wednesday, & Friday per patient) 45 tablet 3   No current facility-administered medications for this visit.  Past Surgical History:  Procedure Laterality Date  . APPENDECTOMY    . CARDIAC CATHETERIZATION N/A 03/01/2016   Procedure: Right Heart Cath;  Surgeon: Troy Sine, MD;  Location: [facility];  Service: Cardiovascular;  Laterality: N/A;  . CATARACT EXTRACTION W/PHACO Left 05/03/2015   Procedure: CATARACT EXTRACTION PHACO AND INTRAOCULAR LENS PLACEMENT LEFT EYE;  Surgeon: Rutherford Guys, MD;  Location: [facility];  Service: Ophthalmology;  Laterality: Left;  CDE:8.03  . COLONOSCOPY  2011  . HERNIA REPAIR    . INSERT / REPLACE / REMOVE PACEMAKER  11/24/2012   Dr Cristopher Peru  . KNEE SURGERY     Left laparoscopic knee surgery  . MENISCECTOMY      Right knee open meniscectomy  . PERMANENT PACEMAKER INSERTION N/A 11/24/2012   Procedure: PERMANENT PACEMAKER INSERTION;  Surgeon: Evans Lance, MD;  Location: [facility];  Service: Cardiovascular;  Laterality: N/A;  . ROBOT ASSISTED LAPAROSCOPIC RADICAL PROSTATECTOMY       Allergies  Allergen Reactions  . Rivaroxaban     Other reaction(s): Other (See Comments) Coughing up blood      Family History  Problem Relation Age of Onset  . Alzheimer's disease Mother 47  . Early death Father 44       trauma  . Heart failure Brother      Social History [name] reports that he quit smoking about 18 years ago. He smoked 1.00 pack per day. He has never used smokeless tobacco. [name] reports that he drinks alcohol.   Review of Systems CONSTITUTIONAL: No weight loss, fever, chills, weakness or fatigue.  HEENT: Eyes: No visual loss, blurred vision, double vision or yellow sclerae.No hearing loss, sneezing, congestion, runny nose or sore throat.  SKIN: No rash or itching.  CARDIOVASCULAR: no chest pain, no palpitaitons.  RESPIRATORY: No shortness of breath, cough or sputum.  GASTROINTESTINAL: No anorexia, nausea, vomiting or diarrhea. No abdominal pain or blood.  GENITOURINARY: No burning on urination, no polyuria NEUROLOGICAL: No headache, dizziness, syncope, paralysis, ataxia, numbness or tingling in the extremities. No change in bowel or bladder control.  MUSCULOSKELETAL: No muscle, back pain, joint pain or stiffness.  LYMPHATICS: No enlarged nodes. No history of splenectomy.  PSYCHIATRIC: No history of depression or anxiety.  ENDOCRINOLOGIC: No reports of sweating, cold or heat intolerance. No polyuria or polydipsia.  Marland Kitchen   Physical Examination Vitals:   01/30/17 0857  BP: 132/64  Pulse: 73   Vitals:   01/30/17 0857  Weight: 133 lb 6.4 oz (60.5 kg)  Height: 5\' 9"  (1.753 m)    Gen: resting comfortably, no acute distress HEENT: no scleral icterus, pupils equal round  and reactive, no palptable cervical adenopathy,  CV: RRR, no m/r/g, no jvd Resp: Clear to auscultation bilaterally GI: abdomen is soft, non-tender, non-distended, normal bowel sounds, no hepatosplenomegaly MSK: extremities are warm, no edema.  Skin: warm, no rash Neuro:  no focal deficits Psych: appropriate affect   Diagnostic Studies  02/2016 echo Study Conclusions  - Left ventricle: The cavity size was normal. Wall thickness was  increased in a pattern of mild LVH. Systolic function was normal.  The estimated ejection fraction was in the range of 55% to 60%.  Wall motion was normal; there were no regional wall motion  abnormalities. The study is not technically sufficient to allow  evaluation of LV diastolic function. - Aortic valve: Mildly calcified annulus. Trileaflet; mildly  thickened leaflets. There was mild regurgitation. Valve area  (VTI): 2.1  cm^2. Valve area (Vmax): 1.79 cm^2. Valve area  (Vmean): 1.75 cm^2. - Mitral valve: Mildly calcified annulus. Mildly thickened leaflets  . There was mild regurgitation. - Left atrium: The atrium was severely dilated. - Right ventricle: The cavity size was severely dilated. Systolic  function was moderately reduced. TAPSE: 12 mm . - Right atrium: The atrium was massively dilated. - Tricuspid valve: There was moderate-severe regurgitation. By  color Doppler TR appears moderate, the eccentricity of the jet  may lead to underestimation. There is systolic hepatic flow  reversal which suggests the TR is severe. - Pulmonary arteries: Systolic pressure was mildly increased. PA  peak pressure: 35 mm Hg (S). - Technically adequate study.  02/2016 RHC Right heart catheterization demonstrating relatively low right heart filling pressures and only very mild pulmonary artery systolic hypertension with a PA systolic pressure at 53-20 mmHg. Under fluoroscopy, the right ventricle appeared significantly dilated.   Cardiac Output  determination 4.4 and 2.7 by the thermodilution and Fick methods, with a cardiac index of 2.6 and 1.6 L/m/m, respectively.   Assessment and Plan  1. Pulmonary HTN/ RV systolic dysfunction/LV diastolic dysfunction - evidence of severe pulmonary HTN by echo 09/2012, PASP at least 73 and potentially underestimated based on TR waveform - recent echo shows worsening RV with severe RV dilatation, moderate dysfunction, and moderate to severe TR with TV anular dilation. PASP 35, though potentially underestiamted due to decreased RV function and severity of TR - recent RHC with fairly normal pulmonary pressures, normal filling pressures - I suspect his RV dysfunction developed in setting of his COPD, LV diastolic dysfunction, and potentially in the past perhaps some component related to his autoimmune disease. Currently normal PA pressures  - continues to do well on current diuretics, we will conitnue.    2. Symptomatic bradycardia - no current symptoms - continue to follow in device clinic   3. Chronic afib - no recent symptoms. Has not been on anticoag in the past due to hemptysis   4. Chronic diastolic HF - he appears euvolemic, continue current diuretic dosing  5. Hyperlipidemia - he will continue statin, we will request labs from pcp  6. HTN - hp is at goal, continue current meds   F/u 6 months. We will request labs from pcp      Arnoldo Lenis, M.D.

## 2017-02-18 DIAGNOSIS — M9903 Segmental and somatic dysfunction of lumbar region: Secondary | ICD-10-CM | POA: Diagnosis not present

## 2017-02-18 DIAGNOSIS — M545 Low back pain: Secondary | ICD-10-CM | POA: Diagnosis not present

## 2017-02-18 DIAGNOSIS — M9905 Segmental and somatic dysfunction of pelvic region: Secondary | ICD-10-CM | POA: Diagnosis not present

## 2017-02-18 DIAGNOSIS — M9902 Segmental and somatic dysfunction of thoracic region: Secondary | ICD-10-CM | POA: Diagnosis not present

## 2017-02-18 DIAGNOSIS — M9901 Segmental and somatic dysfunction of cervical region: Secondary | ICD-10-CM | POA: Diagnosis not present

## 2017-03-20 DIAGNOSIS — M9901 Segmental and somatic dysfunction of cervical region: Secondary | ICD-10-CM | POA: Diagnosis not present

## 2017-03-20 DIAGNOSIS — M9902 Segmental and somatic dysfunction of thoracic region: Secondary | ICD-10-CM | POA: Diagnosis not present

## 2017-03-20 DIAGNOSIS — M9903 Segmental and somatic dysfunction of lumbar region: Secondary | ICD-10-CM | POA: Diagnosis not present

## 2017-03-20 DIAGNOSIS — M9905 Segmental and somatic dysfunction of pelvic region: Secondary | ICD-10-CM | POA: Diagnosis not present

## 2017-03-20 DIAGNOSIS — M545 Low back pain: Secondary | ICD-10-CM | POA: Diagnosis not present

## 2017-04-15 ENCOUNTER — Other Ambulatory Visit: Payer: Self-pay | Admitting: Adult Health

## 2017-04-15 MED ORDER — TORSEMIDE 20 MG PO TABS: 20.0000 mg | ORAL_TABLET | ORAL | 3 refills | Status: DC

## 2017-04-25 ENCOUNTER — Telehealth: Payer: Self-pay | Admitting: Cardiology

## 2017-04-25 ENCOUNTER — Ambulatory Visit (INDEPENDENT_AMBULATORY_CARE_PROVIDER_SITE_OTHER): Payer: PPO | Admitting: *Deleted

## 2017-04-25 DIAGNOSIS — I495 Sick sinus syndrome: Secondary | ICD-10-CM | POA: Diagnosis not present

## 2017-04-25 NOTE — Telephone Encounter (Signed)
LMOVM reminding pt to send remote transmission.   

## 2017-04-26 NOTE — Progress Notes (Signed)
Remote pacemaker transmission.   

## 2017-04-30 ENCOUNTER — Encounter: Payer: Self-pay | Admitting: Cardiology

## 2017-05-21 LAB — CUP PACEART REMOTE DEVICE CHECK
Battery Impedance: 1335 Ohm
Battery Remaining Longevity: 49 mo
Battery Voltage: 2.77 V
Brady Statistic RV Percent Paced: 99 %
Implantable Lead Implant Date: 20140407
Implantable Lead Location: 753860
Implantable Lead Model: 5076
Lead Channel Impedance Value: 0 Ohm
Lead Channel Impedance Value: 631 Ohm
Lead Channel Pacing Threshold Amplitude: 0.875 V
Lead Channel Setting Pacing Amplitude: 2.5 V
Lead Channel Setting Pacing Pulse Width: 0.4 ms
Lead Channel Setting Sensing Sensitivity: 2.8 mV
MDC IDC MSMT LEADCHNL RV PACING THRESHOLD PULSEWIDTH: 0.4 ms
MDC IDC PG IMPLANT DT: 20140407
MDC IDC SESS DTM: 20180906230047

## 2017-06-18 DIAGNOSIS — J449 Chronic obstructive pulmonary disease, unspecified: Secondary | ICD-10-CM | POA: Diagnosis not present

## 2017-06-18 DIAGNOSIS — I5032 Chronic diastolic (congestive) heart failure: Secondary | ICD-10-CM | POA: Diagnosis not present

## 2017-06-18 DIAGNOSIS — I10 Essential (primary) hypertension: Secondary | ICD-10-CM | POA: Diagnosis not present

## 2017-06-18 DIAGNOSIS — R739 Hyperglycemia, unspecified: Secondary | ICD-10-CM | POA: Diagnosis not present

## 2017-06-23 ENCOUNTER — Other Ambulatory Visit: Payer: Self-pay | Admitting: Adult Health

## 2017-07-19 DIAGNOSIS — M818 Other osteoporosis without current pathological fracture: Secondary | ICD-10-CM | POA: Diagnosis not present

## 2017-07-19 DIAGNOSIS — C61 Malignant neoplasm of prostate: Secondary | ICD-10-CM | POA: Diagnosis not present

## 2017-07-25 ENCOUNTER — Ambulatory Visit (INDEPENDENT_AMBULATORY_CARE_PROVIDER_SITE_OTHER): Payer: PPO | Admitting: *Deleted

## 2017-07-25 DIAGNOSIS — I495 Sick sinus syndrome: Secondary | ICD-10-CM

## 2017-07-25 NOTE — Progress Notes (Signed)
Remote pacemaker transmission.   

## 2017-07-30 LAB — CUP PACEART REMOTE DEVICE CHECK
Battery Remaining Longevity: 45 mo
Battery Voltage: 2.76 V
Implantable Lead Implant Date: 20140407
Implantable Lead Model: 5076
Implantable Pulse Generator Implant Date: 20140407
Lead Channel Impedance Value: 0 Ohm
Lead Channel Pacing Threshold Amplitude: 0.75 V
Lead Channel Setting Pacing Amplitude: 2.5 V
Lead Channel Setting Pacing Pulse Width: 0.4 ms
MDC IDC LEAD LOCATION: 753860
MDC IDC MSMT BATTERY IMPEDANCE: 1503 Ohm
MDC IDC MSMT LEADCHNL RV IMPEDANCE VALUE: 625 Ohm
MDC IDC MSMT LEADCHNL RV PACING THRESHOLD PULSEWIDTH: 0.4 ms
MDC IDC SESS DTM: 20181206100804
MDC IDC SET LEADCHNL RV SENSING SENSITIVITY: 2.8 mV
MDC IDC STAT BRADY RV PERCENT PACED: 98 %

## 2017-08-01 DIAGNOSIS — Z79899 Other long term (current) drug therapy: Secondary | ICD-10-CM | POA: Diagnosis not present

## 2017-08-01 DIAGNOSIS — Z5111 Encounter for antineoplastic chemotherapy: Secondary | ICD-10-CM | POA: Diagnosis not present

## 2017-08-01 DIAGNOSIS — Z192 Hormone resistant malignancy status: Secondary | ICD-10-CM | POA: Diagnosis not present

## 2017-08-01 DIAGNOSIS — C61 Malignant neoplasm of prostate: Secondary | ICD-10-CM | POA: Diagnosis not present

## 2017-08-02 ENCOUNTER — Encounter: Payer: Self-pay | Admitting: Cardiology

## 2017-08-05 DIAGNOSIS — N401 Enlarged prostate with lower urinary tract symptoms: Secondary | ICD-10-CM | POA: Diagnosis not present

## 2017-08-22 DIAGNOSIS — R911 Solitary pulmonary nodule: Secondary | ICD-10-CM | POA: Diagnosis not present

## 2017-08-22 DIAGNOSIS — C61 Malignant neoplasm of prostate: Secondary | ICD-10-CM | POA: Diagnosis not present

## 2017-08-22 DIAGNOSIS — R590 Localized enlarged lymph nodes: Secondary | ICD-10-CM | POA: Diagnosis not present

## 2017-08-27 ENCOUNTER — Ambulatory Visit (INDEPENDENT_AMBULATORY_CARE_PROVIDER_SITE_OTHER): Payer: PPO | Admitting: Cardiology

## 2017-08-27 ENCOUNTER — Encounter: Payer: Self-pay | Admitting: Cardiology

## 2017-08-27 VITALS — BP 142/78 | HR 78 | Ht 69.0 in | Wt 141.0 lb

## 2017-08-27 DIAGNOSIS — I5032 Chronic diastolic (congestive) heart failure: Secondary | ICD-10-CM | POA: Diagnosis not present

## 2017-08-27 DIAGNOSIS — I10 Essential (primary) hypertension: Secondary | ICD-10-CM | POA: Diagnosis not present

## 2017-08-27 DIAGNOSIS — I272 Pulmonary hypertension, unspecified: Secondary | ICD-10-CM

## 2017-08-27 DIAGNOSIS — I4891 Unspecified atrial fibrillation: Secondary | ICD-10-CM

## 2017-08-27 NOTE — Progress Notes (Signed)
Clinical Summary Mr. Sesay is a 82 y.o.male  seen today for follow up of the following medical problems.   1. RV dysfunction/Pulmonary HTN/Chronic LV diastolic dysfunction - recent echo with severely dilated RV, moderate RV dysfunction. Moderate to severe TR with dilated TV anulus, PASP 35 by echo.  - echo 09/2012 personally reviewed. Mild to moderate RV enlargement, severe pulmonary HTN with PASP 73. Based on TR spectral Doppler waveform PASP may be underestimated - PFTs 10/2013 airway obstruction, severely decreased DLCO. CT chest 11/2012 with emphsymatous changes and granulomatous changes, ? Bibasilar fibrosis.  - on methotrexate for polymyalgia rheumatia - no snoring, no apneic episodes, some daytime fatigue.   completed RHC 02/2016 mean PA 19, PCWP 10, 163 d/s.   - we previously changed his torsemie to 20mg  MWF.    - home weights around 135 lbs. No recent edema, SOB/DOE  - Toresemide 20mg  MWF.    2. Symptomatic bradycardia - has permanent pacemaker followed by Dr Lovena Le  - 07/2017 normal check. No recent symptoms.   3. Chronic afib - has not been on anticoag due to previous side effects according to previous notes, reportedly hemoptysis.  - no recent palpitations since last visit  4. HTN - home bp's 130s/70s  5. 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    Past Medical History:  Diagnosis Date  . 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 Benign essential tremor   . Exertional dyspnea   . Hyperlipidemia   . Hypertension   . Pacemaker 11/24/2012   Dr Lovena Le  . Prostate cancer ([place])    S/P Prostatectomy  . Tobacco abuse    30 pack years; discontinued in 2000; currently one pack per week     Allergies  Allergen Reactions  . Rivaroxaban     Other reaction(s): Other (See Comments) Coughing up blood     Current Outpatient Medications  Medication Sig Dispense Refill    . acetaminophen (TYLENOL) 500 MG tablet Take 1,000 mg by mouth every 6 (six) hours as needed for moderate pain.    Marland Kitchen amLODipine-olmesartan (AZOR) 5-40 MG per tablet Take 1 tablet by mouth daily.    Marland Kitchen aspirin 325 MG tablet Take 325 mg by mouth daily.    . Calcium Citrate-Vitamin D (CALCIUM + D PO) Take 1,200 mg by mouth daily.    . folic acid (FOLVITE) 371 MCG tablet Take 400 mcg by mouth daily.     Marland Kitchen Leuprolide Acetate, 6 Month, (LUPRON DEPOT, 82-MONTH,) 45 MG injection Inject 45 mg into the muscle every 6 (six) months.    . methotrexate (RHEUMATREX) 2.5 MG tablet Take 2.5 mg by mouth once a week. sunday    . potassium chloride SA (K-DUR,KLOR-CON) 20 MEQ tablet Take 2 tablets (40 mEq total) by mouth every other day. (Patient taking differently: Take 40 mEq by mouth every other day. Monday, Wednesday, Friday) 90 tablet 3  . potassium chloride SA (K-DUR,KLOR-CON) 20 MEQ tablet take 2 tablets by mouth once daily 60 tablet 6  . propranolol (INDERAL) 40 MG tablet Take 40 mg by mouth daily.     . simvastatin (ZOCOR) 40 MG tablet Take 40 mg by mouth daily.      Marland Kitchen torsemide (DEMADEX) 20 MG tablet Take 1 tablet (20 mg total) by mouth every other day. 45 tablet 3   No current facility-administered medications for this visit.  Past Surgical History:  Procedure Laterality Date  . APPENDECTOMY    . CARDIAC CATHETERIZATION N/A 03/01/2016   Procedure: Right Heart Cath;  Surgeon: Troy Sine, MD;  Location: [place] [facility];  Service: Cardiovascular;  Laterality: N/A;  . CATARACT EXTRACTION W/PHACO Left 05/03/2015   Procedure: CATARACT EXTRACTION PHACO AND INTRAOCULAR LENS PLACEMENT LEFT EYE;  Surgeon: Rutherford Guys, MD;  Location: [facility];  Service: Ophthalmology;  Laterality: Left;  CDE:8.03  . COLONOSCOPY  2011  . HERNIA REPAIR    . INSERT / REPLACE / REMOVE PACEMAKER  11/24/2012   Dr Cristopher Peru  . KNEE SURGERY     Left laparoscopic knee surgery  . MENISCECTOMY     Right knee open  meniscectomy  . PERMANENT PACEMAKER INSERTION N/A 11/24/2012   Procedure: PERMANENT PACEMAKER INSERTION;  Surgeon: Evans Lance, MD;  Location: [facility] CATH LAB;  Service: Cardiovascular;  Laterality: N/A;  . ROBOT ASSISTED LAPAROSCOPIC RADICAL PROSTATECTOMY       Allergies  Allergen Reactions  . Rivaroxaban     Other reaction(s): Other (See Comments) Coughing up blood      Family History  Problem Relation Age of Onset  . Alzheimer's disease Mother 19  . Early death Father 56       trauma  . Heart failure Brother      Social History Mr. Ashby reports that he quit smoking about 18 years ago. He smoked 1.00 pack per day. he has never used smokeless tobacco. Mr. Woolstenhulme reports that he drinks alcohol.   Review of Systems CONSTITUTIONAL: No weight loss, fever, chills, weakness or fatigue.  HEENT: Eyes: No visual loss, blurred vision, double vision or yellow sclerae.No hearing loss, sneezing, congestion, runny nose or sore throat.  SKIN: No rash or itching.  CARDIOVASCULAR: per hpi RESPIRATORY: No shortness of breath, cough or sputum.  GASTROINTESTINAL: No anorexia, nausea, vomiting or diarrhea. No abdominal pain or blood.  GENITOURINARY: No burning on urination, no polyuria NEUROLOGICAL: No headache, dizziness, syncope, paralysis, ataxia, numbness or tingling in the extremities. No change in bowel or bladder control.  MUSCULOSKELETAL: No muscle, back pain, joint pain or stiffness.  LYMPHATICS: No enlarged nodes. No history of splenectomy.  PSYCHIATRIC: No history of depression or anxiety.  ENDOCRINOLOGIC: No reports of sweating, cold or heat intolerance. No polyuria or polydipsia.  Marland Kitchen   Physical Examination Vitals:   08/27/17 0817  BP: (!) 142/78  Pulse: 78  SpO2: 92%   Vitals:   08/27/17 0817  Weight: 141 lb (64 kg)  Height: 5\' 9"  (1.753 m)    Gen: resting comfortably, no acute distress HEENT: no scleral icterus, pupils equal round and reactive, no palptable cervical  adenopathy,  CV: RRR, no m/r/g, no jvd Resp: Clear to auscultation bilaterally GI: abdomen is soft, non-tender, non-distended, normal bowel sounds, no hepatosplenomegaly MSK: extremities are warm, no edema.  Skin: warm, no rash Neuro:  no focal deficits Psych: appropriate affect   Diagnostic Studies 02/2016 echo Study Conclusions  - Left ventricle: The cavity size was normal. Wall thickness was  increased in a pattern of mild LVH. Systolic function was normal.  The estimated ejection fraction was in the range of 55% to 60%.  Wall motion was normal; there were no regional wall motion  abnormalities. The study is not technically sufficient to allow  evaluation of LV diastolic function. - Aortic valve: Mildly calcified annulus. Trileaflet; mildly  thickened leaflets. There was mild regurgitation. Valve area  (VTI): 2.1 cm^2. Valve area (  Vmax): 1.79 cm^2. Valve area  (Vmean): 1.75 cm^2. - Mitral valve: Mildly calcified annulus. Mildly thickened leaflets  . There was mild regurgitation. - Left atrium: The atrium was severely dilated. - Right ventricle: The cavity size was severely dilated. Systolic  function was moderately reduced. TAPSE: 12 mm . - Right atrium: The atrium was massively dilated. - Tricuspid valve: There was moderate-severe regurgitation. By  color Doppler TR appears moderate, the eccentricity of the jet  may lead to underestimation. There is systolic hepatic flow  reversal which suggests the TR is severe. - Pulmonary arteries: Systolic pressure was mildly increased. PA  peak pressure: 35 mm Hg (S). - Technically adequate study.  02/2016 RHC Right heart catheterization demonstrating relatively low right heart filling pressures and only very mild pulmonary artery systolic hypertension with a PA systolic pressure at 41-42 mmHg. Under fluoroscopy, the right ventricle appeared significantly dilated.   Cardiac Output determination 4.4 and 2.7 by the  thermodilution and Fick methods, with a cardiac index of 2.6 and 1.6 L/m/m, respectively.    Assessment and Plan   1. Pulmonary HTN/ RV systolic dysfunction/LV diastolic dysfunction - evidence of severe pulmonary HTN by echo 09/2012, PASP at least 73 and potentially underestimated based on TR waveform - recent echo shows worsening RV with severe RV dilatation, moderate dysfunction, and moderate to severe TR with TV anular dilation. PASP 35, though potentially underestiamted due to decreased RV function and severity of TR - recent RHC with fairly normal pulmonary pressures, normal filling pressures - I suspect his RV dysfunction developed in setting of his COPD, LV diastolic dysfunction, and potentially in the past perhaps some component related to his autoimmune disease. Currently normal PA pressures  - no recent troubles, continue current meds   2. Symptomatic bradycardia - asymptomatic, continue to have pacemaker followed in device clinic   3. Chronic afib --continue current meds, no recent symptoms.  Has not been on anticoag in the past due to hemptysis   4. Chronic diastolic HF -no symptoms, continue current diuretic   5. Hyperlipidemia- request pcp labs, continue statin   6. HTN - home bp's at goal, continue current meds    Arnoldo Lenis, M.D.

## 2017-08-27 NOTE — Patient Instructions (Signed)
Medication Instructions:  DECREASE ASPIRIN TO 81 MG DAILY   Labwork: I WILL REQUEST LABS FROM PCP.   Testing/Procedures: NONE  Follow-Up: Your physician wants you to follow-up in: 6 MONTHS.  You will receive a reminder letter in the mail two months in advance. If you don't receive a letter, please call our office to schedule the follow-up appointment.  Any Other Special Instructions Will Be Listed Below (If Applicable).     If you need a refill on your cardiac medications before your next appointment, please call your pharmacy.

## 2017-08-29 ENCOUNTER — Encounter: Payer: Self-pay | Admitting: Cardiology

## 2017-09-04 DIAGNOSIS — C61 Malignant neoplasm of prostate: Secondary | ICD-10-CM | POA: Diagnosis not present

## 2017-09-04 DIAGNOSIS — Z5111 Encounter for antineoplastic chemotherapy: Secondary | ICD-10-CM | POA: Diagnosis not present

## 2017-09-04 DIAGNOSIS — Z8546 Personal history of malignant neoplasm of prostate: Secondary | ICD-10-CM | POA: Diagnosis not present

## 2017-09-04 DIAGNOSIS — Z9079 Acquired absence of other genital organ(s): Secondary | ICD-10-CM | POA: Diagnosis not present

## 2017-09-16 DIAGNOSIS — C61 Malignant neoplasm of prostate: Secondary | ICD-10-CM | POA: Diagnosis not present

## 2017-09-16 DIAGNOSIS — R918 Other nonspecific abnormal finding of lung field: Secondary | ICD-10-CM | POA: Diagnosis not present

## 2017-09-16 DIAGNOSIS — C3431 Malignant neoplasm of lower lobe, right bronchus or lung: Secondary | ICD-10-CM | POA: Diagnosis not present

## 2017-09-18 DIAGNOSIS — J449 Chronic obstructive pulmonary disease, unspecified: Secondary | ICD-10-CM | POA: Diagnosis not present

## 2017-09-18 DIAGNOSIS — C772 Secondary and unspecified malignant neoplasm of intra-abdominal lymph nodes: Secondary | ICD-10-CM | POA: Diagnosis not present

## 2017-09-18 DIAGNOSIS — C3431 Malignant neoplasm of lower lobe, right bronchus or lung: Secondary | ICD-10-CM | POA: Diagnosis not present

## 2017-09-18 DIAGNOSIS — C3490 Malignant neoplasm of unspecified part of unspecified bronchus or lung: Secondary | ICD-10-CM | POA: Diagnosis not present

## 2017-09-18 DIAGNOSIS — C61 Malignant neoplasm of prostate: Secondary | ICD-10-CM | POA: Diagnosis not present

## 2017-09-18 DIAGNOSIS — Z192 Hormone resistant malignancy status: Secondary | ICD-10-CM | POA: Diagnosis not present

## 2017-09-18 DIAGNOSIS — Z5111 Encounter for antineoplastic chemotherapy: Secondary | ICD-10-CM | POA: Diagnosis not present

## 2017-09-24 ENCOUNTER — Ambulatory Visit (INDEPENDENT_AMBULATORY_CARE_PROVIDER_SITE_OTHER): Payer: PPO | Admitting: *Deleted

## 2017-09-24 DIAGNOSIS — C3490 Malignant neoplasm of unspecified part of unspecified bronchus or lung: Secondary | ICD-10-CM | POA: Diagnosis not present

## 2017-09-24 DIAGNOSIS — I495 Sick sinus syndrome: Secondary | ICD-10-CM

## 2017-09-24 DIAGNOSIS — C61 Malignant neoplasm of prostate: Secondary | ICD-10-CM | POA: Diagnosis not present

## 2017-09-24 DIAGNOSIS — Z192 Hormone resistant malignancy status: Secondary | ICD-10-CM | POA: Diagnosis not present

## 2017-09-24 DIAGNOSIS — C3431 Malignant neoplasm of lower lobe, right bronchus or lung: Secondary | ICD-10-CM | POA: Diagnosis not present

## 2017-09-25 ENCOUNTER — Encounter: Payer: Self-pay | Admitting: Cardiology

## 2017-09-25 NOTE — Progress Notes (Signed)
Remote pacemaker transmission.   

## 2017-09-27 ENCOUNTER — Other Ambulatory Visit (HOSPITAL_COMMUNITY): Payer: Self-pay | Admitting: Respiratory Therapy

## 2017-09-27 DIAGNOSIS — J449 Chronic obstructive pulmonary disease, unspecified: Secondary | ICD-10-CM | POA: Diagnosis not present

## 2017-09-27 DIAGNOSIS — I5032 Chronic diastolic (congestive) heart failure: Secondary | ICD-10-CM | POA: Diagnosis not present

## 2017-09-27 DIAGNOSIS — I4891 Unspecified atrial fibrillation: Secondary | ICD-10-CM | POA: Diagnosis not present

## 2017-09-27 DIAGNOSIS — J441 Chronic obstructive pulmonary disease with (acute) exacerbation: Secondary | ICD-10-CM

## 2017-09-27 DIAGNOSIS — C3490 Malignant neoplasm of unspecified part of unspecified bronchus or lung: Secondary | ICD-10-CM | POA: Diagnosis not present

## 2017-10-03 ENCOUNTER — Ambulatory Visit (HOSPITAL_COMMUNITY)
Admission: RE | Admit: 2017-10-03 | Discharge: 2017-10-03 | Disposition: A | Discharge status: Home / Self Care | Payer: PPO | Source: Ambulatory Visit | Attending: Pulmonary Disease | Admitting: Pulmonary Disease

## 2017-10-03 DIAGNOSIS — J441 Chronic obstructive pulmonary disease with (acute) exacerbation: Secondary | ICD-10-CM

## 2017-10-03 LAB — PULMONARY FUNCTION TEST
DL/VA % PRED: 34 %
DL/VA: 1.56 ml/min/mmHg/L
DLCO unc % pred: 28 %
DLCO unc: 8.89 ml/min/mmHg
FEF 25-75 POST: 0.77 L/s
FEF 25-75 Pre: 0.84 L/sec
FEF2575-%Change-Post: -8 %
FEF2575-%PRED-POST: 40 %
FEF2575-%Pred-Pre: 44 %
FEV1-%CHANGE-POST: 0 %
FEV1-%PRED-PRE: 70 %
FEV1-%Pred-Post: 70 %
FEV1-POST: 1.93 L
FEV1-PRE: 1.94 L
FEV1FVC-%Change-Post: 5 %
FEV1FVC-%PRED-PRE: 69 %
FEV6-%Change-Post: -6 %
FEV6-%PRED-POST: 98 %
FEV6-%Pred-Pre: 105 %
FEV6-POST: 3.55 L
FEV6-PRE: 3.8 L
FEV6FVC-%Change-Post: 0 %
FEV6FVC-%PRED-POST: 105 %
FEV6FVC-%PRED-PRE: 105 %
FVC-%Change-Post: -6 %
FVC-%PRED-POST: 94 %
FVC-%PRED-PRE: 100 %
FVC-POST: 3.65 L
FVC-PRE: 3.89 L
POST FEV1/FVC RATIO: 53 %
PRE FEV6/FVC RATIO: 98 %
Post FEV6/FVC ratio: 98 %
Pre FEV1/FVC ratio: 50 %
RV % PRED: 106 %
RV: 2.77 L
TLC % pred: 96 %
TLC: 6.65 L

## 2017-10-03 MED ORDER — ALBUTEROL SULFATE (2.5 MG/3ML) 0.083% IN NEBU
2.5000 mg | INHALATION_SOLUTION | Freq: Once | RESPIRATORY_TRACT | Status: AC
  Administered 2017-10-03: 2.5 mg via RESPIRATORY_TRACT

## 2017-10-04 DIAGNOSIS — C3431 Malignant neoplasm of lower lobe, right bronchus or lung: Secondary | ICD-10-CM | POA: Diagnosis not present

## 2017-10-04 DIAGNOSIS — C61 Malignant neoplasm of prostate: Secondary | ICD-10-CM | POA: Diagnosis not present

## 2017-10-04 DIAGNOSIS — Z510 Encounter for antineoplastic radiation therapy: Secondary | ICD-10-CM | POA: Diagnosis not present

## 2017-10-07 ENCOUNTER — Telehealth: Payer: Self-pay | Admitting: Cardiology

## 2017-10-07 NOTE — Telephone Encounter (Signed)
Returned call no answer. Left detailed message on pt's voicemail for him to call [facility] and have his prescriptions transferred from [facility].

## 2017-10-07 NOTE — Telephone Encounter (Signed)
Please give pt a call about his meds, he's moving his pharmacy from [facility] in [place] to [facility].

## 2017-10-15 DIAGNOSIS — C3431 Malignant neoplasm of lower lobe, right bronchus or lung: Secondary | ICD-10-CM | POA: Diagnosis not present

## 2017-10-15 DIAGNOSIS — C61 Malignant neoplasm of prostate: Secondary | ICD-10-CM | POA: Diagnosis not present

## 2017-10-15 DIAGNOSIS — Z192 Hormone resistant malignancy status: Secondary | ICD-10-CM | POA: Diagnosis not present

## 2017-10-15 DIAGNOSIS — Z5111 Encounter for antineoplastic chemotherapy: Secondary | ICD-10-CM | POA: Diagnosis not present

## 2017-10-21 DIAGNOSIS — C3431 Malignant neoplasm of lower lobe, right bronchus or lung: Secondary | ICD-10-CM | POA: Diagnosis not present

## 2017-10-21 DIAGNOSIS — Z510 Encounter for antineoplastic radiation therapy: Secondary | ICD-10-CM | POA: Diagnosis not present

## 2017-10-22 DIAGNOSIS — C3431 Malignant neoplasm of lower lobe, right bronchus or lung: Secondary | ICD-10-CM | POA: Diagnosis not present

## 2017-10-22 DIAGNOSIS — Z510 Encounter for antineoplastic radiation therapy: Secondary | ICD-10-CM | POA: Diagnosis not present

## 2017-10-24 ENCOUNTER — Telehealth: Payer: Self-pay | Admitting: Cardiology

## 2017-10-24 ENCOUNTER — Ambulatory Visit (INDEPENDENT_AMBULATORY_CARE_PROVIDER_SITE_OTHER): Payer: PPO | Admitting: *Deleted

## 2017-10-24 DIAGNOSIS — I495 Sick sinus syndrome: Secondary | ICD-10-CM | POA: Diagnosis not present

## 2017-10-24 NOTE — Telephone Encounter (Signed)
Spoke with pt and reminded pt of remote transmission that is due today. Pt verbalized understanding.   

## 2017-10-25 ENCOUNTER — Encounter: Payer: Self-pay | Admitting: Cardiology

## 2017-10-25 NOTE — Progress Notes (Signed)
Remote pacemaker transmission.   

## 2017-10-30 DIAGNOSIS — C3431 Malignant neoplasm of lower lobe, right bronchus or lung: Secondary | ICD-10-CM | POA: Diagnosis not present

## 2017-10-30 DIAGNOSIS — Z510 Encounter for antineoplastic radiation therapy: Secondary | ICD-10-CM | POA: Diagnosis not present

## 2017-11-02 LAB — CUP PACEART REMOTE DEVICE CHECK
Battery Remaining Longevity: 42 mo
Battery Voltage: 2.76 V
Date Time Interrogation Session: 20190307223155
Implantable Lead Implant Date: 20140407
Implantable Lead Location: 753860
Implantable Pulse Generator Implant Date: 20140407
Lead Channel Impedance Value: 0 Ohm
Lead Channel Pacing Threshold Pulse Width: 0.4 ms
Lead Channel Setting Sensing Sensitivity: 2.8 mV
MDC IDC MSMT BATTERY IMPEDANCE: 1643 Ohm
MDC IDC MSMT LEADCHNL RV IMPEDANCE VALUE: 666 Ohm
MDC IDC MSMT LEADCHNL RV PACING THRESHOLD AMPLITUDE: 0.75 V
MDC IDC SET LEADCHNL RV PACING AMPLITUDE: 2.5 V
MDC IDC SET LEADCHNL RV PACING PULSEWIDTH: 0.4 ms
MDC IDC STAT BRADY RV PERCENT PACED: 98 %

## 2017-11-11 DIAGNOSIS — H52202 Unspecified astigmatism, left eye: Secondary | ICD-10-CM | POA: Diagnosis not present

## 2017-11-11 DIAGNOSIS — Z961 Presence of intraocular lens: Secondary | ICD-10-CM | POA: Diagnosis not present

## 2017-11-11 DIAGNOSIS — H5212 Myopia, left eye: Secondary | ICD-10-CM | POA: Diagnosis not present

## 2017-11-11 DIAGNOSIS — H04123 Dry eye syndrome of bilateral lacrimal glands: Secondary | ICD-10-CM | POA: Diagnosis not present

## 2017-11-11 DIAGNOSIS — H524 Presbyopia: Secondary | ICD-10-CM | POA: Diagnosis not present

## 2017-11-12 DIAGNOSIS — J439 Emphysema, unspecified: Secondary | ICD-10-CM | POA: Diagnosis not present

## 2017-11-12 DIAGNOSIS — Z5111 Encounter for antineoplastic chemotherapy: Secondary | ICD-10-CM | POA: Diagnosis not present

## 2017-11-12 DIAGNOSIS — C61 Malignant neoplasm of prostate: Secondary | ICD-10-CM | POA: Diagnosis not present

## 2017-11-12 DIAGNOSIS — C3431 Malignant neoplasm of lower lobe, right bronchus or lung: Secondary | ICD-10-CM | POA: Diagnosis not present

## 2017-11-12 DIAGNOSIS — Z192 Hormone resistant malignancy status: Secondary | ICD-10-CM | POA: Diagnosis not present

## 2017-11-12 DIAGNOSIS — F17210 Nicotine dependence, cigarettes, uncomplicated: Secondary | ICD-10-CM | POA: Diagnosis not present

## 2017-11-20 ENCOUNTER — Encounter (HOSPITAL_COMMUNITY): Payer: Self-pay

## 2017-11-20 ENCOUNTER — Encounter (HOSPITAL_COMMUNITY)
Admission: RE | Admit: 2017-11-20 | Discharge: 2017-11-20 | Disposition: A | Discharge status: Home / Self Care | Payer: PPO | Source: Ambulatory Visit | Attending: Pulmonary Disease | Admitting: Pulmonary Disease

## 2017-11-20 VITALS — BP 126/70 | HR 70 | Ht 69.0 in | Wt 134.9 lb

## 2017-11-20 DIAGNOSIS — J449 Chronic obstructive pulmonary disease, unspecified: Secondary | ICD-10-CM | POA: Diagnosis not present

## 2017-11-20 NOTE — Progress Notes (Signed)
Cardiac/Pulmonary Rehab Medication Review by Lopez Pharmacist  Does the patient  feel that his/her medications are working for him/her?  yes  Has the patient been experiencing any side effects to the medications prescribed?  no  Does the patient measure his/her own blood pressure or blood glucose at home?  yes   Does the patient have any problems obtaining medications due to transportation or finances?   no  Understanding of regimen: good Understanding of indications: good Potential of compliance: excellent  Questions asked to Determine Patient Understanding of Medication Regimen:  1. What is the name of the medication?  2. What is the medication used for?  3. When should it be taken?  4. How much should be taken?  5. How will you take it?  6. What side effects should you report?  Understanding Defined as: Excellent: All questions above are correct Good: Questions 1-4 are correct Fair: Questions 1-2 are correct  Poor: 1 or none of the above questions are correct   Pharmacist comments: Pt does not c/o any side effects from medications.  Pt does check BP at home.  Med list updated.  Gillermina Phy was just started.  No issues noted.    Jagger, Peter Lopez 11/20/2017 8:42 AM

## 2017-11-20 NOTE — Progress Notes (Signed)
Daily Session Note  Patient Details  Name: Peter Lopez MRN: [id number] Date of Birth: 1935/08/25 Referring Provider:     PULMONARY REHAB COPD ORIENTATION from 11/20/2017 in [place]  Referring Provider  DR. Luan Pulling      Encounter Date: 11/20/2017  Check In: Session Check In - 11/20/17 0730      Check-In   Location  [facility]    Staff Present  Villa Burgin Angelina Pih, MS, EP, [facility], Exercise Physiologist;Gregory Luther Parody, BS, EP, Exercise Physiologist;Debra Wynetta Emery, RN, BSN    Supervising physician immediately available to respond to emergencies  See telemetry face sheet for immediately available MD    Medication changes reported      No    Fall or balance concerns reported     Yes    Comments  Fallen 1 ime in 12 months. He also gets weak in his knees at times    Tobacco Cessation  -- Quit 2000    Warm-up and Cool-down  Performed as group-led instruction    Resistance Training Performed  Yes    VAD Patient?  No      Pain Assessment   Currently in Pain?  No/denies    Pain Score  0-No pain    Multiple Pain Sites  No       Capillary Blood Glucose: No results found for this or any previous visit (from the past 24 hour(s)).    Social History   Tobacco Use  Smoking Status Former Smoker  . Packs/day: 1.00  . Last attempt to quit: 12/11/1998  . Years since quitting: 18.9  Smokeless Tobacco Never Used    Goals Met:  Proper associated with RPD/PD & O2 Sat Independence with exercise equipment Queuing for purse lip breathing No report of cardiac concerns or symptoms Strength training completed today  Goals Unmet:  Not Applicable  Comments: Check out: 10:30   Dr. Sinda Du is Medical Director for [facility].

## 2017-11-20 NOTE — Progress Notes (Signed)
Pulmonary Individual Treatment Plan  Patient Details  Name: Peter Lopez MRN: [id number] Date of Birth: 1935/09/25 Referring Provider:     PULMONARY REHAB COPD ORIENTATION from 11/20/2017 in [place]  Referring Provider  DR. Hawkins      Initial Encounter Date:    PULMONARY REHAB COPD ORIENTATION from 11/20/2017 in [place]  Date  11/20/17  Referring Provider  DR. Hawkins      Visit Diagnosis: Chronic obstructive pulmonary disease, unspecified COPD type ([place])  Patient's Home Medications on Admission:   Current Outpatient Medications:  .  enzalutamide (XTANDI) 40 MG capsule, Take 160 mg by mouth daily., Disp: , Rfl:  .  folic acid (FOLVITE) 1 MG tablet, Take 1 mg by mouth daily., Disp: , Rfl:  .  acetaminophen (TYLENOL) 500 MG tablet, Take 1,000 mg by mouth every 6 (six) hours as needed for moderate pain., Disp: , Rfl:  .  amLODipine-olmesartan (AZOR) 5-40 MG per tablet, Take 1 tablet by mouth daily., Disp: , Rfl:  .  aspirin EC 81 MG tablet, Take 81 mg by mouth daily., Disp: , Rfl:  .  Calcium Citrate-Vitamin D (CALCIUM + D PO), Take 1,200 mg by mouth daily., Disp: , Rfl:  .  Leuprolide Acetate, 6 Month, (LUPRON DEPOT, 51-MONTH,) 45 MG injection, Inject 45 mg into the muscle every 6 (six) months., Disp: , Rfl:  .  methotrexate (RHEUMATREX) 2.5 MG tablet, Take 2.5 mg by mouth once a week. sunday, Disp: , Rfl:  .  potassium chloride SA (K-DUR,KLOR-CON) 20 MEQ tablet, take 2 tablets by mouth once daily, Disp: 60 tablet, Rfl: 6 .  propranolol (INDERAL) 40 MG tablet, Take 40 mg by mouth daily. , Disp: , Rfl:  .  simvastatin (ZOCOR) 40 MG tablet, Take 40 mg by mouth daily.  , Disp: , Rfl:  .  torsemide (DEMADEX) 20 MG tablet, Take 20 mg by mouth daily. , Disp: , Rfl:   Past Medical History: Past Medical History:  Diagnosis Date  . 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 Benign essential tremor   . Exertional dyspnea   . Hyperlipidemia    . Hypertension   . Pacemaker 11/24/2012   Dr Lovena Le  . Prostate cancer ([place])    S/P Prostatectomy  . Tobacco abuse    30 pack years; discontinued in 2000; currently one pack per week    Tobacco Use: Social History   Tobacco Use  Smoking Status Former Smoker  . Packs/day: 1.00  . Last attempt to quit: 12/11/1998  . Years since quitting: 18.9  Smokeless Tobacco Never Used    Labs: Recent Chemical engineer    Labs for ITP Cardiac and Pulmonary Rehab Latest Ref Rng & Units 05/03/2010 07/22/2010 12/07/2011 03/01/2016   Cholestrol 0 - 200 mg/dL - 159 163 -   LDLCALC 0 - 99 mg/dL - 89 86 -   HDL >39 mg/dL - 58 57 -   Trlycerides <150 mg/dL - 60 100 -   PHART 7.350 - 7.450 7.428 - - -   PCO2ART 35.0 - 45.0 mmHg 36.2 - - -   HCO3 20.0 - 24.0 mEq/L 23.5 - - 27.9(H)   TCO2 0 - 100 mmol/L 20.2 - - 29   O2SAT % 96.4 - - 62.0      Capillary Blood Glucose: No results found for: GLUCAP   Pulmonary Assessment Scores: Pulmonary Assessment Scores    Row Name 11/20/17 1122  ADL UCSD   ADL Phase  Entry     SOB Score total  31     Rest  0     Walk  7     Stairs  3     Bath  1     Dress  1     Shop  0       CAT Score   CAT Score  14       mMRC Score   mMRC Score  2        Pulmonary Function Assessment: Pulmonary Function Assessment - 11/20/17 1021      Pulmonary Function Tests   FVC%  3.86 %    FEV1%  2.74 %    FEV1/FVC Ratio  72    RV%  2.6 %    DLCO%  30.99 %      Initial Spirometry Results   FVC%  3.86 %    FEV1%  2.74 %    FEV1/FVC Ratio  72      Post Bronchodilator Spirometry Results   FVC%  94 %    FEV1%  70 %    FEV1/FVC Ratio  73    Comments  Actual FEV/FVC = 53      Breath   Bilateral Breath Sounds  Clear    Shortness of Breath  -- with exertion       Exercise Target Goals: Date: 11/20/17  Exercise Program Goal: Individual exercise prescription set using results from initial 6 min walk test and THRR while considering  patient's  activity barriers and safety.   Exercise Prescription Goal: Initial exercise prescription builds to 30-45 minutes a day of aerobic activity, 2-3 days per week.  Home exercise guidelines will be given to patient during program as part of exercise prescription that the participant will acknowledge.  Activity Barriers & Risk Stratification: Activity Barriers & Cardiac Risk Stratification - 11/20/17 1012      Activity Barriers & Cardiac Risk Stratification   Activity Barriers  Other (comment);Joint Problems;Balance Concerns    Comments  Tremors    Cardiac Risk Stratification  High       6 Minute Walk: 6 Minute Walk    Row Name 11/20/17 1011         6 Minute Walk   Phase  Initial     Distance  750 feet     Distance % Change  0 %     Distance Feet Change  0 ft     Walk Time  6 minutes     # of Rest Breaks  1     MPH  1.42     METS  2.08     RPE  12     Perceived Dyspnea   13     VO2 Peak  7.56     Symptoms  No     Resting HR  70 bpm     Resting BP  126/70     Resting Oxygen Saturation   97 %     Exercise Oxygen Saturation  during 6 min walk  89 %     Max Ex. HR  104 bpm     Max Ex. BP  160/74     2 Minute Post BP  130/70        Oxygen Initial Assessment: Oxygen Initial Assessment - 11/20/17 1130      Home Oxygen   Home Oxygen Device  None    Sleep Oxygen Prescription  None  Home Exercise Oxygen Prescription  None    Home at Rest Exercise Oxygen Prescription  None      Program Oxygen Prescription   Program Oxygen Prescription  None       Oxygen Re-Evaluation:   Oxygen Discharge (Final Oxygen Re-Evaluation):   Initial Exercise Prescription: Initial Exercise Prescription - 11/20/17 1000      Date of Initial Exercise RX and Referring Provider   Date  11/20/17    Referring Provider  DR. Hawkins      Treadmill   MPH  1    Grade  0    Minutes  15    METs  1.7      NuStep   Level  1    SPM  46    Minutes  20    METs  1.8      Prescription Details    Frequency (times per week)  3    Duration  Progress to 30 minutes of continuous aerobic without signs/symptoms of physical distress      Intensity   THRR 40-80% of Max Heartrate  [phone]    Ratings of Perceived Exertion  11-13    Perceived Dyspnea  0-4      Progression   Progression  Continue progressive overload as per policy without signs/symptoms or physical distress.      Resistance Training   Training Prescription  Yes    Weight  1    Reps  10-15       Perform Capillary Blood Glucose checks as needed.  Exercise Prescription Changes:   Exercise Comments:   Exercise Goals and Review:  Exercise Goals    Row Name 11/20/17 1015             Exercise Goals   Increase Physical Activity  Yes       Intervention  Provide advice, education, support and counseling about physical activity/exercise needs.;Develop an individualized exercise prescription for aerobic and resistive training based on initial evaluation findings, risk stratification, comorbidities and participant's personal goals.       Expected Outcomes  Short Term: Attend rehab on a regular basis to increase amount of physical activity.       Increase Strength and Stamina  Yes       Intervention  Provide advice, education, support and counseling about physical activity/exercise needs.;Develop an individualized exercise prescription for aerobic and resistive training based on initial evaluation findings, risk stratification, comorbidities and participant's personal goals.       Expected Outcomes  Short Term: Increase workloads from initial exercise prescription for resistance, speed, and METs.;Long Term: Improve cardiorespiratory fitness, muscular endurance and strength as measured by increased METs and functional capacity (6MWT);Short Term: Perform resistance training exercises routinely during rehab and add in resistance training at home       Able to understand and use rate of perceived exertion (RPE) scale  Yes        Intervention  Provide education and explanation on how to use RPE scale       Expected Outcomes  Short Term: Able to use RPE daily in rehab to express subjective intensity level;Long Term:  Able to use RPE to guide intensity level when exercising independently       Able to understand and use Dyspnea scale  Yes       Intervention  Provide education and explanation on how to use Dyspnea scale       Expected Outcomes  Short Term: Able to use Dyspnea scale  daily in rehab to express subjective sense of shortness of breath during exertion;Long Term: Able to use Dyspnea scale to guide intensity level when exercising independently       Knowledge and understanding of Target Heart Rate Range (THRR)  Yes       Intervention  Provide education and explanation of THRR including how the numbers were predicted and where they are located for reference       Expected Outcomes  Short Term: Able to state/look up THRR;Long Term: Able to use THRR to govern intensity when exercising independently;Short Term: Able to use daily as guideline for intensity in rehab       Able to check pulse independently  Yes       Intervention  Provide education and demonstration on how to check pulse in carotid and radial arteries.;Review the importance of being able to check your own pulse for safety during independent exercise       Expected Outcomes  Short Term: Able to explain why pulse checking is important during independent exercise;Long Term: Able to check pulse independently and accurately       Understanding of Exercise Prescription  Yes       Intervention  Provide education, explanation, and written materials on patient's individual exercise prescription       Expected Outcomes  Short Term: Able to explain program exercise prescription;Long Term: Able to explain home exercise prescription to exercise independently          Exercise Goals Re-Evaluation :   Discharge Exercise Prescription (Final Exercise Prescription  Changes):   Nutrition:  Target Goals: Understanding of nutrition guidelines, daily intake of sodium 1500mg , cholesterol 200mg , calories 30% from fat and 7% or less from saturated fats, daily to have 5 or more servings of fruits and vegetables.  Biometrics: Pre Biometrics - 11/20/17 1015      Pre Biometrics   Height  5\' 9"  (1.753 m)    Weight  134 lb 14.4 oz (61.2 kg)    Waist Circumference  32 inches    Hip Circumference  36 inches    Waist to Hip Ratio  0.89 %    BMI (Calculated)  19.91    Triceps Skinfold  10 mm    % Body Fat  19.7 %    Grip Strength  44.53 kg    Flexibility  0 in    Single Leg Stand  0 seconds        Nutrition Therapy Plan and Nutrition Goals: Nutrition Therapy & Goals - 11/20/17 1132      Personal Nutrition Goals   Personal Goal #2  He eats very little. When is eating he eats a heart healthy diet.        Nutrition Assessments: Nutrition Assessments - 11/20/17 1132      MEDFICTS Scores   Pre Score  3       Nutrition Goals Re-Evaluation:   Nutrition Goals Discharge (Final Nutrition Goals Re-Evaluation):   Psychosocial: Target Goals: Acknowledge presence or absence of significant depression and/or stress, maximize coping skills, provide positive support system. Participant is able to verbalize types and ability to use techniques and skills needed for reducing stress and depression.  Initial Review & Psychosocial Screening: Initial Psych Review & Screening - 11/20/17 1134      Initial Review   Current issues with  None Identified      Family Dynamics   Good Support System?  Yes      Barriers   Psychosocial barriers  to participate in program  There are no identifiable barriers or psychosocial needs.      Screening Interventions   Interventions  Encouraged to exercise    Expected Outcomes  Short Term goal: Identification and review with participant of any Quality of Life or Depression concerns found by scoring the questionnaire.;Long  Term goal: The participant improves quality of Life and PHQ9 Scores as seen by post scores and/or verbalization of changes       Quality of Life Scores: Quality of Life - 11/20/17 1016      Quality of Life Scores   Health/Function Pre  14.23 %    Socioeconomic Pre  24.75 %    Psych/Spiritual Pre  21.36 %    Family Pre  22.5 %    GLOBAL Pre  18.83 %      Scores of 19 and below usually indicate a poorer quality of life in these areas.  A difference of  2-3 points is a clinically meaningful difference.  A difference of 2-3 points in the total score of the Quality of Life Index has been associated with significant improvement in overall quality of life, self-image, physical symptoms, and general health in studies assessing change in quality of life.   PHQ-9: Recent Review Flowsheet Data    Depression screen [facility] 2/9 11/20/2017   Decreased Interest 1   Down, Depressed, Hopeless 0   PHQ - 2 Score 1   Altered sleeping 0   Tired, decreased energy 2   Change in appetite 0   Feeling bad or failure about yourself  0   Trouble concentrating 0   Moving slowly or fidgety/restless 0   Suicidal thoughts 0   PHQ-9 Score 3   Difficult doing work/chores Not difficult at all     Interpretation of Total Score  Total Score Depression Severity:  1-4 = Minimal depression, 5-9 = Mild depression, 10-14 = Moderate depression, 15-19 = Moderately severe depression, 20-27 = Severe depression   Psychosocial Evaluation and Intervention: Psychosocial Evaluation - 11/20/17 1145      Psychosocial Evaluation & Interventions   Interventions  Encouraged to exercise with the program and follow exercise prescription    Continue Psychosocial Services   Follow up required by staff       Psychosocial Re-Evaluation:   Psychosocial Discharge (Final Psychosocial Re-Evaluation):    Education: Education Goals: Education classes will be provided on a weekly basis, covering required topics. Participant will state  understanding/return demonstration of topics presented.  Learning Barriers/Preferences: Learning Barriers/Preferences - 11/20/17 6195      Learning Barriers/Preferences   Learning Barriers  None    Learning Preferences  Skilled Demonstration;Individual Instruction;Group Instruction       Education Topics: How Lungs Work and Diseases: - Discuss the anatomy of the lungs and diseases that can affect the lungs, such as COPD.   Exercise: -Discuss the importance of exercise, FITT principles of exercise, normal and abnormal responses to exercise, and how to exercise safely.   Environmental Irritants: -Discuss types of environmental irritants and how to limit exposure to environmental irritants.   Meds/Inhalers and oxygen: - Discuss respiratory medications, definition of an inhaler and oxygen, and the proper way to use an inhaler and oxygen.   Energy Saving Techniques: - Discuss methods to conserve energy and decrease shortness of breath when performing activities of daily living.    Bronchial Hygiene / Breathing Techniques: - Discuss breathing mechanics, pursed-lip breathing technique,  proper posture, effective ways to clear airways, and other  functional breathing techniques   Cleaning Equipment: - Provides group verbal and written instruction about the health risks of elevated stress, cause of high stress, and healthy ways to reduce stress.   Nutrition I: Fats: - Discuss the types of cholesterol, what cholesterol does to the body, and how cholesterol levels can be controlled.   Nutrition II: Labels: -Discuss the different components of food labels and how to read food labels.   Respiratory Infections: - Discuss the signs and symptoms of respiratory infections, ways to prevent respiratory infections, and the importance of seeking medical treatment when having a respiratory infection.   Stress I: Signs and Symptoms: - Discuss the causes of stress, how stress may lead to  anxiety and depression, and ways to limit stress.   Stress II: Relaxation: -Discuss relaxation techniques to limit stress.   Oxygen for Home/Travel: - Discuss how to prepare for travel when on oxygen and proper ways to transport and store oxygen to ensure safety.   Knowledge Questionnaire Score: Knowledge Questionnaire Score - 11/20/17 1019      Knowledge Questionnaire Score   Pre Score  11/18       Core Components/Risk Factors/Patient Goals at Admission: Personal Goals and Risk Factors at Admission - 11/20/17 1132      Core Components/Risk Factors/Patient Goals on Admission    Weight Management  Weight Maintenance    Personal Goal Other  Yes    Personal Goal  Increase lung capacity, Strengthen legs, gain stability.     Intervention  Attend PR 2 x week and supplement at home exercise 3 x week.     Expected Outcomes  Reach goals.        Core Components/Risk Factors/Patient Goals Review:    Core Components/Risk Factors/Patient Goals at Discharge (Final Review):    ITP Comments: ITP Comments    Row Name 11/20/17 1129           ITP Comments  Mr. Whetsell is a pleasant 82 year old gentleman who is getting ready to start. He will have several other doctors appointments due to having lung cancer and prostate cancer. He is eager to get started.           Comments: Patient arrived for 1st visit/orientation/education at 0730. Patient was referred to PR by Dr. Luan Pulling due to COPD (J44.9).  During orientation advised patient on arrival and appointment times what to wear, what to do before, during and after exercise. Reviewed attendance and class policy. Talked about inclement weather and class consultation policy. Pt is scheduled to return Pulmonary Rehab on 11/26/17 at 13:30. Pt was advised to come to class 15 minutes before class starts. Patient was also given instructions on meeting with the dietician and attending the Family Structure classes. Discussed RPE/Dpysnea scales.  Discussed initial THR and how to find their radial and/or carotid pulse. Discussed the initial exercise prescription and how this effects their progress. Pt is eager to get started. Patient participated in warm up stretches followed by light weights and resistance bands. Patient was able to complete 6 minute walk test. Patient did not c/o pain. Had to pause test at 3 minutes due to SOB and bilateral knee weakness. Resumed walking after 5 minutes. Also experienced light headedness during test midway. All symptoms subsided after 2 minute rest. Patient was back to normal with no symptoms at end of orientation. Patient was measured for the equipment. Discussed equipment safety with patient. Took patient pre-anthropometric measurements. Patient finished visit at 10:30.

## 2017-11-21 NOTE — Progress Notes (Signed)
Pulmonary Individual Treatment Plan  Patient Details  Name: Peter Lopez MRN: [id number] Date of Birth: June 01, 1936 Referring Provider:     PULMONARY REHAB COPD ORIENTATION from 11/20/2017 in [place]  Referring Provider  DR. Hawkins      Initial Encounter Date:    PULMONARY REHAB COPD ORIENTATION from 11/20/2017 in [place]  Date  11/20/17  Referring Provider  DR. Hawkins      Visit Diagnosis: Chronic obstructive pulmonary disease, unspecified COPD type ([place])  Patient's Home Medications on Admission:   Current Outpatient Medications:  .  enzalutamide (XTANDI) 40 MG capsule, Take 160 mg by mouth daily., Disp: , Rfl:  .  folic acid (FOLVITE) 1 MG tablet, Take 1 mg by mouth daily., Disp: , Rfl:  .  acetaminophen (TYLENOL) 500 MG tablet, Take 1,000 mg by mouth every 6 (six) hours as needed for moderate pain., Disp: , Rfl:  .  amLODipine-olmesartan (AZOR) 5-40 MG per tablet, Take 1 tablet by mouth daily., Disp: , Rfl:  .  aspirin EC 81 MG tablet, Take 81 mg by mouth daily., Disp: , Rfl:  .  Calcium Citrate-Vitamin D (CALCIUM + D PO), Take 1,200 mg by mouth daily., Disp: , Rfl:  .  Leuprolide Acetate, 6 Month, (LUPRON DEPOT, 34-MONTH,) 45 MG injection, Inject 45 mg into the muscle every 6 (six) months., Disp: , Rfl:  .  methotrexate (RHEUMATREX) 2.5 MG tablet, Take 2.5 mg by mouth once a week. sunday, Disp: , Rfl:  .  potassium chloride SA (K-DUR,KLOR-CON) 20 MEQ tablet, take 2 tablets by mouth once daily, Disp: 60 tablet, Rfl: 6 .  propranolol (INDERAL) 40 MG tablet, Take 40 mg by mouth daily. , Disp: , Rfl:  .  simvastatin (ZOCOR) 40 MG tablet, Take 40 mg by mouth daily.  , Disp: , Rfl:  .  torsemide (DEMADEX) 20 MG tablet, Take 20 mg by mouth daily. , Disp: , Rfl:   Past Medical History: Past Medical History:  Diagnosis Date  . 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 Benign essential tremor   . Exertional dyspnea   . Hyperlipidemia    . Hypertension   . Pacemaker 11/24/2012   Dr Lovena Le  . Prostate cancer ([place])    S/P Prostatectomy  . Tobacco abuse    30 pack years; discontinued in 2000; currently one pack per week    Tobacco Use: Social History   Tobacco Use  Smoking Status Former Smoker  . Packs/day: 1.00  . Last attempt to quit: 12/11/1998  . Years since quitting: 18.9  Smokeless Tobacco Never Used    Labs: Recent Chemical engineer    Labs for ITP Cardiac and Pulmonary Rehab Latest Ref Rng & Units 05/03/2010 07/22/2010 12/07/2011 03/01/2016   Cholestrol 0 - 200 mg/dL - 159 163 -   LDLCALC 0 - 99 mg/dL - 89 86 -   HDL >39 mg/dL - 58 57 -   Trlycerides <150 mg/dL - 60 100 -   PHART 7.350 - 7.450 7.428 - - -   PCO2ART 35.0 - 45.0 mmHg 36.2 - - -   HCO3 20.0 - 24.0 mEq/L 23.5 - - 27.9(H)   TCO2 0 - 100 mmol/L 20.2 - - 29   O2SAT % 96.4 - - 62.0      Capillary Blood Glucose: No results found for: GLUCAP   Pulmonary Assessment Scores: Pulmonary Assessment Scores    Row Name 11/20/17 1122  ADL UCSD   ADL Phase  Entry     SOB Score total  31     Rest  0     Walk  7     Stairs  3     Bath  1     Dress  1     Shop  0       CAT Score   CAT Score  14       mMRC Score   mMRC Score  2        Pulmonary Function Assessment: Pulmonary Function Assessment - 11/20/17 1021      Pulmonary Function Tests   FVC%  3.86 %    FEV1%  2.74 %    FEV1/FVC Ratio  72    RV%  2.6 %    DLCO%  30.99 %      Initial Spirometry Results   FVC%  3.86 %    FEV1%  2.74 %    FEV1/FVC Ratio  72      Post Bronchodilator Spirometry Results   FVC%  94 %    FEV1%  70 %    FEV1/FVC Ratio  73    Comments  Actual FEV/FVC = 53      Breath   Bilateral Breath Sounds  Clear    Shortness of Breath  -- with exertion       Exercise Target Goals: Date: 11/20/17  Exercise Program Goal: Individual exercise prescription set using results from initial 6 min walk test and THRR while considering  patient's  activity barriers and safety.   Exercise Prescription Goal: Initial exercise prescription builds to 30-45 minutes a day of aerobic activity, 2-3 days per week.  Home exercise guidelines will be given to patient during program as part of exercise prescription that the participant will acknowledge.  Activity Barriers & Risk Stratification: Activity Barriers & Cardiac Risk Stratification - 11/20/17 1012      Activity Barriers & Cardiac Risk Stratification   Activity Barriers  Other (comment);Joint Problems;Balance Concerns    Comments  Tremors    Cardiac Risk Stratification  High       6 Minute Walk: 6 Minute Walk    Row Name 11/20/17 1011         6 Minute Walk   Phase  Initial     Distance  750 feet     Distance % Change  0 %     Distance Feet Change  0 ft     Walk Time  6 minutes     # of Rest Breaks  1     MPH  1.42     METS  2.08     RPE  12     Perceived Dyspnea   13     VO2 Peak  7.56     Symptoms  No     Resting HR  70 bpm     Resting BP  126/70     Resting Oxygen Saturation   97 %     Exercise Oxygen Saturation  during 6 min walk  89 %     Max Ex. HR  104 bpm     Max Ex. BP  160/74     2 Minute Post BP  130/70        Oxygen Initial Assessment: Oxygen Initial Assessment - 11/20/17 1130      Home Oxygen   Home Oxygen Device  None    Sleep Oxygen Prescription  None  Home Exercise Oxygen Prescription  None    Home at Rest Exercise Oxygen Prescription  None      Program Oxygen Prescription   Program Oxygen Prescription  None       Oxygen Re-Evaluation:   Oxygen Discharge (Final Oxygen Re-Evaluation):   Initial Exercise Prescription: Initial Exercise Prescription - 11/20/17 1000      Date of Initial Exercise RX and Referring Provider   Date  11/20/17    Referring Provider  DR. Hawkins      Treadmill   MPH  1    Grade  0    Minutes  15    METs  1.7      NuStep   Level  1    SPM  46    Minutes  20    METs  1.8      Prescription Details    Frequency (times per week)  3    Duration  Progress to 30 minutes of continuous aerobic without signs/symptoms of physical distress      Intensity   THRR 40-80% of Max Heartrate  [phone]    Ratings of Perceived Exertion  11-13    Perceived Dyspnea  0-4      Progression   Progression  Continue progressive overload as per policy without signs/symptoms or physical distress.      Resistance Training   Training Prescription  Yes    Weight  1    Reps  10-15       Perform Capillary Blood Glucose checks as needed.  Exercise Prescription Changes:   Exercise Comments:   Exercise Goals and Review:  Exercise Goals    Row Name 11/20/17 1015             Exercise Goals   Increase Physical Activity  Yes       Intervention  Provide advice, education, support and counseling about physical activity/exercise needs.;Develop an individualized exercise prescription for aerobic and resistive training based on initial evaluation findings, risk stratification, comorbidities and participant's personal goals.       Expected Outcomes  Short Term: Attend rehab on a regular basis to increase amount of physical activity.       Increase Strength and Stamina  Yes       Intervention  Provide advice, education, support and counseling about physical activity/exercise needs.;Develop an individualized exercise prescription for aerobic and resistive training based on initial evaluation findings, risk stratification, comorbidities and participant's personal goals.       Expected Outcomes  Short Term: Increase workloads from initial exercise prescription for resistance, speed, and METs.;Long Term: Improve cardiorespiratory fitness, muscular endurance and strength as measured by increased METs and functional capacity (6MWT);Short Term: Perform resistance training exercises routinely during rehab and add in resistance training at home       Able to understand and use rate of perceived exertion (RPE) scale  Yes        Intervention  Provide education and explanation on how to use RPE scale       Expected Outcomes  Short Term: Able to use RPE daily in rehab to express subjective intensity level;Long Term:  Able to use RPE to guide intensity level when exercising independently       Able to understand and use Dyspnea scale  Yes       Intervention  Provide education and explanation on how to use Dyspnea scale       Expected Outcomes  Short Term: Able to use Dyspnea scale  daily in rehab to express subjective sense of shortness of breath during exertion;Long Term: Able to use Dyspnea scale to guide intensity level when exercising independently       Knowledge and understanding of Target Heart Rate Range (THRR)  Yes       Intervention  Provide education and explanation of THRR including how the numbers were predicted and where they are located for reference       Expected Outcomes  Short Term: Able to state/look up THRR;Long Term: Able to use THRR to govern intensity when exercising independently;Short Term: Able to use daily as guideline for intensity in rehab       Able to check pulse independently  Yes       Intervention  Provide education and demonstration on how to check pulse in carotid and radial arteries.;Review the importance of being able to check your own pulse for safety during independent exercise       Expected Outcomes  Short Term: Able to explain why pulse checking is important during independent exercise;Long Term: Able to check pulse independently and accurately       Understanding of Exercise Prescription  Yes       Intervention  Provide education, explanation, and written materials on patient's individual exercise prescription       Expected Outcomes  Short Term: Able to explain program exercise prescription;Long Term: Able to explain home exercise prescription to exercise independently          Exercise Goals Re-Evaluation :   Discharge Exercise Prescription (Final Exercise Prescription  Changes):   Nutrition:  Target Goals: Understanding of nutrition guidelines, daily intake of sodium 1500mg , cholesterol 200mg , calories 30% from fat and 7% or less from saturated fats, daily to have 5 or more servings of fruits and vegetables.  Biometrics: Pre Biometrics - 11/20/17 1015      Pre Biometrics   Height  5\' 9"  (1.753 m)    Weight  134 lb 14.4 oz (61.2 kg)    Waist Circumference  32 inches    Hip Circumference  36 inches    Waist to Hip Ratio  0.89 %    BMI (Calculated)  19.91    Triceps Skinfold  10 mm    % Body Fat  19.7 %    Grip Strength  44.53 kg    Flexibility  0 in    Single Leg Stand  0 seconds        Nutrition Therapy Plan and Nutrition Goals: Nutrition Therapy & Goals - 11/20/17 1132      Personal Nutrition Goals   Personal Goal #2  He eats very little. When is eating he eats a heart healthy diet.        Nutrition Assessments: Nutrition Assessments - 11/20/17 1132      MEDFICTS Scores   Pre Score  3       Nutrition Goals Re-Evaluation:   Nutrition Goals Discharge (Final Nutrition Goals Re-Evaluation):   Psychosocial: Target Goals: Acknowledge presence or absence of significant depression and/or stress, maximize coping skills, provide positive support system. Participant is able to verbalize types and ability to use techniques and skills needed for reducing stress and depression.  Initial Review & Psychosocial Screening: Initial Psych Review & Screening - 11/20/17 1134      Initial Review   Current issues with  None Identified      Family Dynamics   Good Support System?  Yes      Barriers   Psychosocial barriers  to participate in program  There are no identifiable barriers or psychosocial needs.      Screening Interventions   Interventions  Encouraged to exercise    Expected Outcomes  Short Term goal: Identification and review with participant of any Quality of Life or Depression concerns found by scoring the questionnaire.;Long  Term goal: The participant improves quality of Life and PHQ9 Scores as seen by post scores and/or verbalization of changes       Quality of Life Scores: Quality of Life - 11/20/17 1016      Quality of Life Scores   Health/Function Pre  14.23 %    Socioeconomic Pre  24.75 %    Psych/Spiritual Pre  21.36 %    Family Pre  22.5 %    GLOBAL Pre  18.83 %      Scores of 19 and below usually indicate a poorer quality of life in these areas.  A difference of  2-3 points is a clinically meaningful difference.  A difference of 2-3 points in the total score of the Quality of Life Index has been associated with significant improvement in overall quality of life, self-image, physical symptoms, and general health in studies assessing change in quality of life.   PHQ-9: Recent Review Flowsheet Data    Depression screen [facility] 2/9 11/20/2017   Decreased Interest 1   Down, Depressed, Hopeless 0   PHQ - 2 Score 1   Altered sleeping 0   Tired, decreased energy 2   Change in appetite 0   Feeling bad or failure about yourself  0   Trouble concentrating 0   Moving slowly or fidgety/restless 0   Suicidal thoughts 0   PHQ-9 Score 3   Difficult doing work/chores Not difficult at all     Interpretation of Total Score  Total Score Depression Severity:  1-4 = Minimal depression, 5-9 = Mild depression, 10-14 = Moderate depression, 15-19 = Moderately severe depression, 20-27 = Severe depression   Psychosocial Evaluation and Intervention: Psychosocial Evaluation - 11/20/17 1145      Psychosocial Evaluation & Interventions   Interventions  Encouraged to exercise with the program and follow exercise prescription    Continue Psychosocial Services   Follow up required by staff       Psychosocial Re-Evaluation:   Psychosocial Discharge (Final Psychosocial Re-Evaluation):    Education: Education Goals: Education classes will be provided on a weekly basis, covering required topics. Participant will state  understanding/return demonstration of topics presented.  Learning Barriers/Preferences: Learning Barriers/Preferences - 11/20/17 2426      Learning Barriers/Preferences   Learning Barriers  None    Learning Preferences  Skilled Demonstration;Individual Instruction;Group Instruction       Education Topics: How Lungs Work and Diseases: - Discuss the anatomy of the lungs and diseases that can affect the lungs, such as COPD.   Exercise: -Discuss the importance of exercise, FITT principles of exercise, normal and abnormal responses to exercise, and how to exercise safely.   Environmental Irritants: -Discuss types of environmental irritants and how to limit exposure to environmental irritants.   Meds/Inhalers and oxygen: - Discuss respiratory medications, definition of an inhaler and oxygen, and the proper way to use an inhaler and oxygen.   Energy Saving Techniques: - Discuss methods to conserve energy and decrease shortness of breath when performing activities of daily living.    Bronchial Hygiene / Breathing Techniques: - Discuss breathing mechanics, pursed-lip breathing technique,  proper posture, effective ways to clear airways, and other  functional breathing techniques   Cleaning Equipment: - Provides group verbal and written instruction about the health risks of elevated stress, cause of high stress, and healthy ways to reduce stress.   Nutrition I: Fats: - Discuss the types of cholesterol, what cholesterol does to the body, and how cholesterol levels can be controlled.   Nutrition II: Labels: -Discuss the different components of food labels and how to read food labels.   Respiratory Infections: - Discuss the signs and symptoms of respiratory infections, ways to prevent respiratory infections, and the importance of seeking medical treatment when having a respiratory infection.   Stress I: Signs and Symptoms: - Discuss the causes of stress, how stress may lead to  anxiety and depression, and ways to limit stress.   Stress II: Relaxation: -Discuss relaxation techniques to limit stress.   Oxygen for Home/Travel: - Discuss how to prepare for travel when on oxygen and proper ways to transport and store oxygen to ensure safety.   Knowledge Questionnaire Score: Knowledge Questionnaire Score - 11/20/17 1019      Knowledge Questionnaire Score   Pre Score  11/18       Core Components/Risk Factors/Patient Goals at Admission: Personal Goals and Risk Factors at Admission - 11/20/17 1132      Core Components/Risk Factors/Patient Goals on Admission    Weight Management  Weight Maintenance    Personal Goal Other  Yes    Personal Goal  Increase lung capacity, Strengthen legs, gain stability.     Intervention  Attend PR 2 x week and supplement at home exercise 3 x week.     Expected Outcomes  Reach goals.        Core Components/Risk Factors/Patient Goals Review:    Core Components/Risk Factors/Patient Goals at Discharge (Final Review):    ITP Comments: ITP Comments    Row Name 11/20/17 1129 11/21/17 1250         ITP Comments  Mr. Seymore is a pleasant 82 year old gentleman who is getting ready to start. He will have several other doctors appointments due to having lung cancer and prostate cancer. He is eager to get started.   Patient new to program. Plans to start Tuesday 11/26/17. Will continue to monitor for progress.          Comments: ITP 30 Day  REVIEW Patient new to program. Plans to start Tuesday 11/26/17. Will continue to monitor for progress.

## 2017-11-26 ENCOUNTER — Encounter (HOSPITAL_COMMUNITY)
Admission: RE | Admit: 2017-11-26 | Discharge: 2017-11-26 | Disposition: A | Discharge status: Home / Self Care | Payer: PPO | Source: Ambulatory Visit | Attending: Pulmonary Disease | Admitting: Pulmonary Disease

## 2017-11-26 DIAGNOSIS — J449 Chronic obstructive pulmonary disease, unspecified: Secondary | ICD-10-CM | POA: Diagnosis not present

## 2017-11-26 NOTE — Progress Notes (Signed)
Daily Session Note  Patient Details  Name: Peter Lopez MRN: [id number] Date of Birth: 22-Oct-1935 Referring Provider:     PULMONARY REHAB COPD ORIENTATION from 11/20/2017 in [place]  Referring Provider  DR. Luan Pulling      Encounter Date: 11/26/2017  Check In: Session Check In - 11/26/17 1334      Check-In   Location  [facility]    Staff Present  Diane Angelina Pih, MS, EP, [facility], Exercise Physiologist;Or[place]ondo [place] Luther Parody, BS, EP, Exercise Physiologist    Supervising physician immediately available to respond to emergencies  See telemetry face sheet for immediately available MD    Medication changes reported      No    Fall or balance concerns reported     Yes    Comments  Fallen 1 ime in 12 months. He also gets weak in his knees at times    Warm-up and Cool-down  Performed as group-led instruction    Resistance Training Performed  Yes    VAD Patient?  No      Pain Assessment   Currently in Pain?  No/denies    Pain Score  0-No pain    Multiple Pain Sites  No       Capillary Blood Glucose: No results found for this or any previous visit (from the past 24 hour(s)).    Social History   Tobacco Use  Smoking Status Former Smoker  . Packs/day: 1.00  . Last attempt to quit: 12/11/1998  . Years since quitting: 18.9  Smokeless Tobacco Never Used    Goals Met:  Independence with exercise equipment Improved SOB with ADL's Using PLB without cueing & demonstrates good technique Exercise tolerated well No report of cardiac concerns or symptoms Strength training completed today  Goals Unmet:  Not Applicable  Comments: Check out 230   Dr. Sinda Du is Medical Director for [facility] Pulmonary Rehab.

## 2017-11-28 ENCOUNTER — Encounter (HOSPITAL_COMMUNITY)
Admission: RE | Admit: 2017-11-28 | Discharge: 2017-11-28 | Disposition: A | Discharge status: Home / Self Care | Payer: PPO | Source: Ambulatory Visit | Attending: Pulmonary Disease | Admitting: Pulmonary Disease

## 2017-11-28 DIAGNOSIS — J449 Chronic obstructive pulmonary disease, unspecified: Secondary | ICD-10-CM | POA: Diagnosis not present

## 2017-11-28 NOTE — Progress Notes (Signed)
Daily Session Note  Patient Details  Name: Peter Lopez MRN: [id number] Date of Birth: 08-Apr-1936 Referring Provider:     PULMONARY REHAB COPD ORIENTATION from 11/20/2017 in [place]  Referring Provider  DR. Luan Pulling      Encounter Date: 11/28/2017  Check In: Session Check In - 11/28/17 1333      Check-In   Location  [facility]    Staff Present  Suzanne Boron, BS, EP, Exercise Physiologist;Debra Wynetta Emery, RN, BSN    Supervising physician immediately available to respond to emergencies  See telemetry face sheet for immediately available MD    Medication changes reported      No    Fall or balance concerns reported     Yes    Comments  Fallen 1 ime in 12 months. He also gets weak in his knees at times    Warm-up and Cool-down  Performed as group-led instruction    Resistance Training Performed  Yes    VAD Patient?  No      Pain Assessment   Currently in Pain?  No/denies    Pain Score  0-No pain    Multiple Pain Sites  No       Capillary Blood Glucose: No results found for this or any previous visit (from the past 24 hour(s)).    Social History   Tobacco Use  Smoking Status Former Smoker  . Packs/day: 1.00  . Last attempt to quit: 12/11/1998  . Years since quitting: 18.9  Smokeless Tobacco Never Used    Goals Met:  Independence with exercise equipment Improved SOB with ADL's Exercise tolerated well No report of cardiac concerns or symptoms Strength training completed today  Goals Unmet:  Not Applicable  Comments: Check out 230   Dr. Sinda Du is Medical Director for [facility] Pulmonary Rehab.

## 2017-12-03 ENCOUNTER — Encounter (HOSPITAL_COMMUNITY)
Admission: RE | Admit: 2017-12-03 | Discharge: 2017-12-03 | Disposition: A | Discharge status: Home / Self Care | Payer: PPO | Source: Ambulatory Visit | Attending: Pulmonary Disease | Admitting: Pulmonary Disease

## 2017-12-03 DIAGNOSIS — J449 Chronic obstructive pulmonary disease, unspecified: Secondary | ICD-10-CM

## 2017-12-03 NOTE — Progress Notes (Signed)
Daily Session Note  Patient Details  Name: Peter Lopez MRN: [id number] Date of Birth: 1935/11/24 Referring Provider:     PULMONARY REHAB COPD ORIENTATION from 11/20/2017 in [place]  Referring Provider  DR. Luan Pulling      Encounter Date: 12/03/2017  Check In: Session Check In - 12/03/17 1327      Check-In   Location  [facility]    Staff Present  Suzanne Boron, BS, EP, Exercise Physiologist;Diane Coad, MS, EP, [facility], Exercise Physiologist    Supervising physician immediately available to respond to emergencies  See telemetry face sheet for immediately available MD    Medication changes reported      No    Fall or balance concerns reported     Yes    Comments  Fallen 1 ime in 12 months. He also gets weak in his knees at times    Warm-up and Cool-down  Performed as group-led instruction    Resistance Training Performed  Yes      Pain Assessment   Currently in Pain?  No/denies    Pain Score  0-No pain    Multiple Pain Sites  No       Capillary Blood Glucose: No results found for this or any previous visit (from the past 24 hour(s)).    Social History   Tobacco Use  Smoking Status Former Smoker  . Packs/day: 1.00  . Last attempt to quit: 12/11/1998  . Years since quitting: 18.9  Smokeless Tobacco Never Used    Goals Met:  Independence with exercise equipment Improved SOB with ADL's Using PLB without cueing & demonstrates good technique Exercise tolerated well No report of cardiac concerns or symptoms Strength training completed today  Goals Unmet:  Not Applicable  Comments: Check out 230   Dr. Sinda Du is Medical Director for [facility] Pulmonary Rehab.

## 2017-12-05 ENCOUNTER — Encounter (HOSPITAL_COMMUNITY)
Admission: RE | Admit: 2017-12-05 | Discharge: 2017-12-05 | Disposition: A | Discharge status: Home / Self Care | Payer: PPO | Source: Ambulatory Visit | Attending: Pulmonary Disease | Admitting: Pulmonary Disease

## 2017-12-05 DIAGNOSIS — J449 Chronic obstructive pulmonary disease, unspecified: Secondary | ICD-10-CM | POA: Diagnosis not present

## 2017-12-05 NOTE — Progress Notes (Signed)
Daily Session Note  Patient Details  Name: Peter Lopez MRN: [id number] Date of Birth: 15-Dec-1935 Referring Provider:     PULMONARY REHAB COPD ORIENTATION from 11/20/2017 in [place]  Referring Provider  DR. Luan Pulling      Encounter Date: 12/05/2017  Check In: Session Check In - 12/05/17 1321      Check-In   Location  [facility]    Staff Present  Suzanne Boron, BS, EP, Exercise Physiologist;Debra Wynetta Emery, RN, BSN    Supervising physician immediately available to respond to emergencies  See telemetry face sheet for immediately available MD    Medication changes reported      No    Fall or balance concerns reported     Yes    Comments  Fallen 1 ime in 12 months. He also gets weak in his knees at times    Warm-up and Cool-down  Performed as group-led instruction    Resistance Training Performed  Yes    VAD Patient?  No      Pain Assessment   Currently in Pain?  No/denies    Pain Score  0-No pain    Multiple Pain Sites  No       Capillary Blood Glucose: No results found for this or any previous visit (from the past 24 hour(s)).    Social History   Tobacco Use  Smoking Status Former Smoker  . Packs/day: 1.00  . Last attempt to quit: 12/11/1998  . Years since quitting: 18.9  Smokeless Tobacco Never Used    Goals Met:  Independence with exercise equipment Improved SOB with ADL's Using PLB without cueing & demonstrates good technique Exercise tolerated well No report of cardiac concerns or symptoms Strength training completed today  Goals Unmet:  Not Applicable  Comments: Check out 230   Dr. Sinda Du is Medical Director for [facility] Pulmonary Rehab.

## 2017-12-10 ENCOUNTER — Encounter (HOSPITAL_COMMUNITY)
Admission: RE | Admit: 2017-12-10 | Discharge: 2017-12-10 | Disposition: A | Discharge status: Home / Self Care | Payer: PPO | Source: Ambulatory Visit | Attending: Pulmonary Disease | Admitting: Pulmonary Disease

## 2017-12-10 DIAGNOSIS — J449 Chronic obstructive pulmonary disease, unspecified: Secondary | ICD-10-CM | POA: Diagnosis not present

## 2017-12-10 NOTE — Progress Notes (Signed)
Daily Session Note  Patient Details  Name: Peter Lopez MRN: [id number] Date of Birth: [birth year] Referring Provider:     PULMONARY REHAB COPD ORIENTATION from 11/20/2017 in [place]  Referring Provider  DR. Luan Pulling      Encounter Date: 12/10/2017  Check In: Session Check In - 12/10/17 1411      Check-In   Location  [facility]    Staff Present  Suzanne Boron, BS, EP, Exercise Physiologist;Debra Wynetta Emery, RN, BSN    Supervising physician immediately available to respond to emergencies  See telemetry face sheet for immediately available MD    Medication changes reported      No    Fall or balance concerns reported     Yes    Comments  Fallen 1 ime in 12 months. He also gets weak in his knees at times    Warm-up and Cool-down  Performed as group-led instruction    Resistance Training Performed  Yes    VAD Patient?  No      Pain Assessment   Currently in Pain?  No/denies    Pain Score  0-No pain    Multiple Pain Sites  No       Capillary Blood Glucose: No results found for this or any previous visit (from the past 24 hour(s)).    Social History   Tobacco Use  Smoking Status Former Smoker  . Packs/day: 1.00  . Last attempt to quit: 12/11/1998  . Years since quitting: 19.0  Smokeless Tobacco Never Used    Goals Met:  Independence with exercise equipment Improved SOB with ADL's Using PLB without cueing & demonstrates good technique Exercise tolerated well No report of cardiac concerns or symptoms Strength training completed today  Goals Unmet:  Not Applicable  Comments: Check out 230   Dr. Sinda Du is Medical Director for [facility].

## 2017-12-12 ENCOUNTER — Encounter (HOSPITAL_COMMUNITY): Payer: PPO

## 2017-12-16 DIAGNOSIS — C3431 Malignant neoplasm of lower lobe, right bronchus or lung: Secondary | ICD-10-CM | POA: Diagnosis not present

## 2017-12-16 DIAGNOSIS — Z192 Hormone resistant malignancy status: Secondary | ICD-10-CM | POA: Diagnosis not present

## 2017-12-16 DIAGNOSIS — C61 Malignant neoplasm of prostate: Secondary | ICD-10-CM | POA: Diagnosis not present

## 2017-12-16 DIAGNOSIS — J439 Emphysema, unspecified: Secondary | ICD-10-CM | POA: Diagnosis not present

## 2017-12-16 DIAGNOSIS — F17210 Nicotine dependence, cigarettes, uncomplicated: Secondary | ICD-10-CM | POA: Diagnosis not present

## 2017-12-16 DIAGNOSIS — Z5111 Encounter for antineoplastic chemotherapy: Secondary | ICD-10-CM | POA: Diagnosis not present

## 2017-12-16 DIAGNOSIS — C772 Secondary and unspecified malignant neoplasm of intra-abdominal lymph nodes: Secondary | ICD-10-CM | POA: Diagnosis not present

## 2017-12-17 ENCOUNTER — Encounter (HOSPITAL_COMMUNITY)
Admission: RE | Admit: 2017-12-17 | Discharge: 2017-12-17 | Disposition: A | Discharge status: Home / Self Care | Payer: PPO | Source: Ambulatory Visit | Attending: Pulmonary Disease | Admitting: Pulmonary Disease

## 2017-12-17 DIAGNOSIS — J449 Chronic obstructive pulmonary disease, unspecified: Secondary | ICD-10-CM

## 2017-12-17 NOTE — Progress Notes (Signed)
Daily Session Note  Patient Details  Name: Peter Lopez MRN: [id number] Date of Birth: 1935-11-26 Referring Provider:     PULMONARY REHAB COPD ORIENTATION from 11/20/2017 in [place]  Referring Provider  DR. Luan Pulling      Encounter Date: 12/17/2017  Check In: Session Check In - 12/17/17 1341      Check-In   Location  [facility]    Staff Present  Suzanne Boron, BS, EP, Exercise Physiologist;Diane Coad, MS, EP, [facility], Exercise Physiologist    Supervising physician immediately available to respond to emergencies  See telemetry face sheet for immediately available MD    Medication changes reported      No    Fall or balance concerns reported     Yes    Comments  Fallen 1 ime in 12 months. He also gets weak in his knees at times    Warm-up and Cool-down  Performed as group-led instruction    Resistance Training Performed  Yes    VAD Patient?  No      Pain Assessment   Currently in Pain?  No/denies    Pain Score  0-No pain    Multiple Pain Sites  No       Capillary Blood Glucose: No results found for this or any previous visit (from the past 24 hour(s)).    Social History   Tobacco Use  Smoking Status Former Smoker  . Packs/day: 1.00  . Last attempt to quit: 12/11/1998  . Years since quitting: 19.0  Smokeless Tobacco Never Used    Goals Met:  Independence with exercise equipment Improved SOB with ADL's Using PLB without cueing & demonstrates good technique Exercise tolerated well No report of cardiac concerns or symptoms Strength training completed today  Goals Unmet:  Not Applicable  Comments: Check out 230   Dr. Sinda Du is Medical Director for [facility].

## 2017-12-18 ENCOUNTER — Encounter (HOSPITAL_COMMUNITY): Payer: PPO

## 2017-12-19 ENCOUNTER — Encounter (HOSPITAL_COMMUNITY)
Admission: RE | Admit: 2017-12-19 | Discharge: 2017-12-19 | Disposition: A | Discharge status: Home / Self Care | Payer: PPO | Source: Ambulatory Visit | Attending: Pulmonary Disease | Admitting: Pulmonary Disease

## 2017-12-19 DIAGNOSIS — J449 Chronic obstructive pulmonary disease, unspecified: Secondary | ICD-10-CM | POA: Insufficient documentation

## 2017-12-19 NOTE — Progress Notes (Signed)
Pulmonary Individual Treatment Plan  Patient Details  Name: Peter Lopez MRN: [id number] Date of Birth: [birth year] Referring Provider:     PULMONARY REHAB COPD ORIENTATION from 11/20/2017 in [place]  Referring Provider  Peter Lopez      Initial Encounter Date:    PULMONARY REHAB COPD ORIENTATION from 11/20/2017 in [place]  Date  11/20/17  Referring Provider  Peter Lopez      Visit Diagnosis: Chronic obstructive pulmonary disease, unspecified COPD type ([place])  Patient's Home Medications on Admission:   Current Outpatient Medications:  .  acetaminophen (TYLENOL) 500 MG tablet, Take 1,000 mg by mouth every 6 (six) hours as needed for moderate pain., Disp: , Rfl:  .  amLODipine-olmesartan (AZOR) 5-40 MG per tablet, Take 1 tablet by mouth daily., Disp: , Rfl:  .  aspirin EC 81 MG tablet, Take 81 mg by mouth daily., Disp: , Rfl:  .  Calcium Citrate-Vitamin D (CALCIUM + D PO), Take 1,200 mg by mouth daily., Disp: , Rfl:  .  enzalutamide (XTANDI) 40 MG capsule, Take 160 mg by mouth daily., Disp: , Rfl:  .  folic acid (FOLVITE) 1 MG tablet, Take 1 mg by mouth daily., Disp: , Rfl:  .  Leuprolide Acetate, 6 Month, (LUPRON DEPOT, 4-MONTH,) 45 MG injection, Inject 45 mg into the muscle every 6 (six) months., Disp: , Rfl:  .  methotrexate (RHEUMATREX) 2.5 MG tablet, Take 2.5 mg by mouth once a week. sunday, Disp: , Rfl:  .  potassium chloride SA (K-DUR,KLOR-CON) 20 MEQ tablet, take 2 tablets by mouth once daily, Disp: 60 tablet, Rfl: 6 .  propranolol (INDERAL) 40 MG tablet, Take 40 mg by mouth daily. , Disp: , Rfl:  .  simvastatin (ZOCOR) 40 MG tablet, Take 40 mg by mouth daily.  , Disp: , Rfl:  .  torsemide (DEMADEX) 20 MG tablet, Take 20 mg by mouth daily. , Disp: , Rfl:   Past Medical History: Past Medical History:  Diagnosis Date  . 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 Benign essential tremor   . Exertional dyspnea   . Hyperlipidemia    . Hypertension   . Pacemaker 11/24/2012   Peter Lopez  . Prostate cancer ([place])    S/P Prostatectomy  . Tobacco abuse    30 pack years; discontinued in 2000; currently one pack per week    Tobacco Use: Social History   Tobacco Use  Smoking Status Former Smoker  . Packs/day: 1.00  . Last attempt to quit: 12/11/1998  . Years since quitting: 19.0  Smokeless Tobacco Never Used    Labs: Recent Chemical engineer    Labs for ITP Cardiac and Pulmonary Rehab Latest Ref Rng & Units 05/03/2010 07/22/2010 12/07/2011 03/01/2016   Cholestrol 0 - 200 mg/dL - 159 163 -   LDLCALC 0 - 99 mg/dL - 89 86 -   HDL >39 mg/dL - 58 57 -   Trlycerides <150 mg/dL - 60 100 -   PHART 7.350 - 7.450 7.428 - - -   PCO2ART 35.0 - 45.0 mmHg 36.2 - - -   HCO3 20.0 - 24.0 mEq/L 23.5 - - 27.9(H)   TCO2 0 - 100 mmol/L 20.2 - - 29   O2SAT % 96.4 - - 62.0      Capillary Blood Glucose: No results found for: GLUCAP   Pulmonary Assessment Scores: Pulmonary Assessment Scores    Row Name 11/20/17 1122  ADL UCSD   ADL Phase  Entry     SOB Score total  31     Rest  0     Walk  7     Stairs  3     Bath  1     Dress  1     Shop  0       CAT Score   CAT Score  14       mMRC Score   mMRC Score  2        Pulmonary Function Assessment: Pulmonary Function Assessment - 11/20/17 1021      Pulmonary Function Tests   FVC%  3.86 %    FEV1%  2.74 %    FEV1/FVC Ratio  72    RV%  2.6 %    DLCO%  30.99 %      Initial Spirometry Results   FVC%  3.86 %    FEV1%  2.74 %    FEV1/FVC Ratio  72      Post Bronchodilator Spirometry Results   FVC%  94 %    FEV1%  70 %    FEV1/FVC Ratio  73    Comments  Actual FEV/FVC = 53      Breath   Bilateral Breath Sounds  Clear    Shortness of Breath  -- with exertion       Exercise Target Goals:    Exercise Program Goal: Individual exercise prescription set using results from initial 6 min walk test and THRR while considering  patient's activity  barriers and safety.   Exercise Prescription Goal: Initial exercise prescription builds to 30-45 minutes a day of aerobic activity, 2-3 days per week.  Home exercise guidelines will be given to patient during program as part of exercise prescription that the participant will acknowledge.  Activity Barriers & Risk Stratification: Activity Barriers & Cardiac Risk Stratification - 11/20/17 1012      Activity Barriers & Cardiac Risk Stratification   Activity Barriers  Other (comment);Joint Problems;Balance Concerns    Comments  Tremors    Cardiac Risk Stratification  High       6 Minute Walk: 6 Minute Walk    Row Name 11/20/17 1011         6 Minute Walk   Phase  Initial     Distance  750 feet     Distance % Change  0 %     Distance Feet Change  0 ft     Walk Time  6 minutes     # of Rest Breaks  1     MPH  1.42     METS  2.08     RPE  12     Perceived Dyspnea   13     VO2 Peak  7.56     Symptoms  No     Resting HR  70 bpm     Resting BP  126/70     Resting Oxygen Saturation   97 %     Exercise Oxygen Saturation  during 6 min walk  89 %     Max Ex. HR  104 bpm     Max Ex. BP  160/74     2 Minute Post BP  130/70        Oxygen Initial Assessment: Oxygen Initial Assessment - 11/20/17 1130      Home Oxygen   Home Oxygen Device  None    Sleep Oxygen Prescription  None  Home Exercise Oxygen Prescription  None    Home at Rest Exercise Oxygen Prescription  None      Program Oxygen Prescription   Program Oxygen Prescription  None       Oxygen Re-Evaluation: Oxygen Re-Evaluation    [name] Name 12/19/17 1443             Program Oxygen Prescription   Program Oxygen Prescription  None         Home Oxygen   Home Oxygen Device  None       Sleep Oxygen Prescription  None       Home Exercise Oxygen Prescription  None       Home at Rest Exercise Oxygen Prescription  None         Goals/Expected Outcomes   Short Term Goals  To learn and demonstrate proper pursed lip  breathing techniques or other breathing techniques.;To learn and understand importance of maintaining oxygen saturations>88%;To learn and understand importance of monitoring SPO2 with pulse oximeter and demonstrate accurate use of the pulse oximeter.       Long  Term Goals  Exhibits proper breathing techniques, such as pursed lip breathing or other method taught during program session;Maintenance of O2 saturations>88%;Verbalizes importance of monitoring SPO2 with pulse oximeter and return demonstration       Comments  Patient is able to demonstrate proper usage of pulse oximeter and pursed lip breathing during exercise. He also is able to verbalize the importance of maintaining his O2 sat level >88%.       Goals/Expected Outcomes  Patient will continue to meet his short and long term goals.           Oxygen Discharge (Final Oxygen Re-Evaluation): Oxygen Re-Evaluation - 12/19/17 1443      Program Oxygen Prescription   Program Oxygen Prescription  None      Home Oxygen   Home Oxygen Device  None    Sleep Oxygen Prescription  None    Home Exercise Oxygen Prescription  None    Home at Rest Exercise Oxygen Prescription  None      Goals/Expected Outcomes   Short Term Goals  To learn and demonstrate proper pursed lip breathing techniques or other breathing techniques.;To learn and understand importance of maintaining oxygen saturations>88%;To learn and understand importance of monitoring SPO2 with pulse oximeter and demonstrate accurate use of the pulse oximeter.    Long  Term Goals  Exhibits proper breathing techniques, such as pursed lip breathing or other method taught during program session;Maintenance of O2 saturations>88%;Verbalizes importance of monitoring SPO2 with pulse oximeter and return demonstration    Comments  Patient is able to demonstrate proper usage of pulse oximeter and pursed lip breathing during exercise. He also is able to verbalize the importance of maintaining his O2 sat  level >88%.    Goals/Expected Outcomes  Patient will continue to meet his short and long term goals.        Initial Exercise Prescription: Initial Exercise Prescription - 11/20/17 1000      Date of Initial Exercise RX and Referring Provider   Date  11/20/17    Referring Provider  Peter Lopez      Treadmill   MPH  1    Grade  0    Minutes  15    METs  1.7      NuStep   Level  1    SPM  46    Minutes  20    METs  1.8  Prescription Details   Frequency (times per week)  3    Duration  Progress to 30 minutes of continuous aerobic without signs/symptoms of physical distress      Intensity   THRR 40-80% of Max Heartrate  [phone]    Ratings of Perceived Exertion  11-13    Perceived Dyspnea  0-4      Progression   Progression  Continue progressive overload as per policy without signs/symptoms or physical distress.      Resistance Training   Training Prescription  Yes    Weight  1    Reps  10-15       Perform Capillary Blood Glucose checks as needed.  Exercise Prescription Changes:  Exercise Prescription Changes    Row Name 12/12/17 0800             Response to Exercise   Blood Pressure (Admit)  140/70       Blood Pressure (Exercise)  150/74       Blood Pressure (Exit)  140/70       Heart Rate (Admit)  66 bpm       Heart Rate (Exercise)  91 bpm       Heart Rate (Exit)  75 bpm       Oxygen Saturation (Admit)  95 %       Oxygen Saturation (Exercise)  94 %       Oxygen Saturation (Exit)  94 %       Rating of Perceived Exertion (Exercise)  13       Perceived Dyspnea (Exercise)  14       Duration  Progress to 30 minutes of  aerobic without signs/symptoms of physical distress       Intensity  THRR New [phone]         Progression   Progression  Continue to progress workloads to maintain intensity without signs/symptoms of physical distress.         Resistance Training   Training Prescription  Yes       Weight  3       Reps  10-15         Treadmill    MPH  1       Grade  0       Minutes  15       METs  1.7         NuStep   Level  1       SPM  87       Minutes  20       METs  1.7         Home Exercise Plan   Plans to continue exercise at  Home (comment)       Frequency  Add 3 additional days to program exercise sessions.       Initial Home Exercises Provided  12/05/17          Exercise Comments:  Exercise Comments    Row Name 12/12/17 0809           Exercise Comments  Patient is doing well in PR and has increased his SPMs on the nustep machine while maintaining his speed on the treadmill. Patient O2 levels are looking good while he is being active not often dropping below 90. We ar eworking on increasing his stability levels by helping him keep his balance on the treadmill by holding on to the support bars. We will continue to monitor the patient throughout the remainder  of the program.           Exercise Goals and Review:  Exercise Goals    Row Name 11/20/17 1015             Exercise Goals   Increase Physical Activity  Yes       Intervention  Provide advice, education, support and counseling about physical activity/exercise needs.;Develop an individualized exercise prescription for aerobic and resistive training based on initial evaluation findings, risk stratification, comorbidities and participant's personal goals.       Expected Outcomes  Short Term: Attend rehab on a regular basis to increase amount of physical activity.       Increase Strength and Stamina  Yes       Intervention  Provide advice, education, support and counseling about physical activity/exercise needs.;Develop an individualized exercise prescription for aerobic and resistive training based on initial evaluation findings, risk stratification, comorbidities and participant's personal goals.       Expected Outcomes  Short Term: Increase workloads from initial exercise prescription for resistance, speed, and METs.;Long Term: Improve  cardiorespiratory fitness, muscular endurance and strength as measured by increased METs and functional capacity (6MWT);Short Term: Perform resistance training exercises routinely during rehab and add in resistance training at home       Able to understand and use rate of perceived exertion (RPE) scale  Yes       Intervention  Provide education and explanation on how to use RPE scale       Expected Outcomes  Short Term: Able to use RPE daily in rehab to express subjective intensity level;Long Term:  Able to use RPE to guide intensity level when exercising independently       Able to understand and use Dyspnea scale  Yes       Intervention  Provide education and explanation on how to use Dyspnea scale       Expected Outcomes  Short Term: Able to use Dyspnea scale daily in rehab to express subjective sense of shortness of breath during exertion;Long Term: Able to use Dyspnea scale to guide intensity level when exercising independently       Knowledge and understanding of Target Heart Rate Range (THRR)  Yes       Intervention  Provide education and explanation of THRR including how the numbers were predicted and where they are located for reference       Expected Outcomes  Short Term: Able to state/look up THRR;Long Term: Able to use THRR to govern intensity when exercising independently;Short Term: Able to use daily as guideline for intensity in rehab       Able to check pulse independently  Yes       Intervention  Provide education and demonstration on how to check pulse in carotid and radial arteries.;Review the importance of being able to check your own pulse for safety during independent exercise       Expected Outcomes  Short Term: Able to explain why pulse checking is important during independent exercise;Long Term: Able to check pulse independently and accurately       Understanding of Exercise Prescription  Yes       Intervention  Provide education, explanation, and written materials on patient's  individual exercise prescription       Expected Outcomes  Short Term: Able to explain program exercise prescription;Long Term: Able to explain home exercise prescription to exercise independently          Exercise Goals Re-Evaluation : Exercise Goals  Re-Evaluation    Row Name 12/16/17 1508             Exercise Goal Re-Evaluation   Exercise Goals Review  Increase Physical Activity;Increase Strength and Stamina;Able to understand and use rate of perceived exertion (RPE) scale;Able to check pulse independently;Knowledge and understanding of Target Heart Rate Range (THRR);Able to understand and use Dyspnea scale;Understanding of Exercise Prescription       Comments  Patient has been doing well since beginning the program. Patient is only on sixth session but has shown great potential on the nustep machine gaining an average SPMs of 87 so far. Patient has also maintained his speed on the treadmill and has kept his O2 level above 90 while on it. Patients goals are being worked on becoming met. Patient will be continued to be monitored throughout the remainder of the program.        Expected Outcomes  Patient wishes to increase his lung capacity and to strength legs as well as gain stability.           Discharge Exercise Prescription (Final Exercise Prescription Changes): Exercise Prescription Changes - 12/12/17 0800      Response to Exercise   Blood Pressure (Admit)  140/70    Blood Pressure (Exercise)  150/74    Blood Pressure (Exit)  140/70    Heart Rate (Admit)  66 bpm    Heart Rate (Exercise)  91 bpm    Heart Rate (Exit)  75 bpm    Oxygen Saturation (Admit)  95 %    Oxygen Saturation (Exercise)  94 %    Oxygen Saturation (Exit)  94 %    Rating of Perceived Exertion (Exercise)  13    Perceived Dyspnea (Exercise)  14    Duration  Progress to 30 minutes of  aerobic without signs/symptoms of physical distress    Intensity  THRR New [id number]      Progression   Progression  Continue  to progress workloads to maintain intensity without signs/symptoms of physical distress.      Resistance Training   Training Prescription  Yes    Weight  3    Reps  10-15      Treadmill   MPH  1    Grade  0    Minutes  15    METs  1.7      NuStep   Level  1    SPM  87    Minutes  20    METs  1.7      Home Exercise Plan   Plans to continue exercise at  Home (comment)    Frequency  Add 3 additional days to program exercise sessions.    Initial Home Exercises Provided  12/05/17       Nutrition:  Target Goals: Understanding of nutrition guidelines, daily intake of sodium <1562m, cholesterol <2019m calories 30% from fat and 7% or less from saturated fats, daily to have 5 or more servings of fruits and vegetables.  Biometrics: Pre Biometrics - 11/20/17 1015      Pre Biometrics   Height  _0  (1.753 m)    Weight  134 lb 14.4 oz (61.2 kg)    Waist Circumference  32 inches    Hip Circumference  36 inches    Waist to Hip Ratio  0.89 %    BMI (Calculated)  19.91    Triceps Skinfold  10 mm    % Body Fat  19.7 %    Grip  Strength  44.53 kg    Flexibility  0 in    Single Leg Stand  0 seconds        Nutrition Therapy Plan and Nutrition Goals: Nutrition Therapy & Goals - 11/21/17 1524      Personal Nutrition Goals   Nutrition Goal  For heart healthy choices add >50% of whole grains, make half their plate fruits and vegetables. Discuss the difference between starchy vegetables and leafy greens, and how leafy vegetables provide fiber, helps maintain healthy weight, helps control blood glucose, and lowers cholesterol.  Discuss purchasing fresh or frozen vegetable to reduce sodium and not to add grease, fat or sugar. Consume <18oz of red meat per week. Consume lean cuts of meats and very little of meats high in sodium and nitrates such as pork and lunch meats. Discussed portion control for all food groups.      Personal Goal #2  He eats very little. When is eating he eats a heart  healthy diet.     Comments  Patient met with RD 11/21/17.      Intervention Plan   Intervention  Nutrition handout(s) given to patient.    Expected Outcomes  Short Term Goal: Understand basic principles of dietary content, such as calories, fat, sodium, cholesterol and nutrients.       Nutrition Assessments: Nutrition Assessments - 11/20/17 1132      MEDFICTS Scores   Pre Score  3       Nutrition Goals Re-Evaluation: Nutrition Goals Re-Evaluation    [place] Name 12/19/17 1445             Goals   Current Weight  136 lb (61.7 kg)       Nutrition Goal  For heart healthy choices add >50% of whole grains, make half their plate fruits and vegetables. Discuss the difference between starchy vegetables and leafy greens, and how leafy vegetables provide fiber, helps maintain healthy weight, helps control blood glucose, and lowers cholesterol.  Discuss purchasing fresh or frozen vegetable to reduce sodium and not to add grease, fat or sugar. Consume <18oz of red meat per week. Consume lean cuts of meats and very little of meats high in sodium and nitrates such as pork and lunch meats. Discussed portion control for all food groups.         Comment  Patient has gained 2 lbs since his initial visit. He says he eat a healthy diet. Will continue to monitor.          Personal Goal #2 Re-Evaluation   Personal Goal #2  He eats very little. When is eating he eats a heart healthy diet.           Nutrition Goals Discharge (Final Nutrition Goals Re-Evaluation): Nutrition Goals Re-Evaluation - 12/19/17 1445      Goals   Current Weight  136 lb (61.7 kg)    Nutrition Goal  For heart healthy choices add >50% of whole grains, make half their plate fruits and vegetables. Discuss the difference between starchy vegetables and leafy greens, and how leafy vegetables provide fiber, helps maintain healthy weight, helps control blood glucose, and lowers cholesterol.  Discuss purchasing fresh or frozen vegetable to  reduce sodium and not to add grease, fat or sugar. Consume <18oz of red meat per week. Consume lean cuts of meats and very little of meats high in sodium and nitrates such as pork and lunch meats. Discussed portion control for all food groups.      Comment  Patient has gained 2 lbs since his initial visit. He says he eat a healthy diet. Will continue to monitor.       Personal Goal #2 Re-Evaluation   Personal Goal #2  He eats very little. When is eating he eats a heart healthy diet.        Psychosocial: Target Goals: Acknowledge presence or absence of significant depression and/or stress, maximize coping skills, provide positive support system. Participant is able to verbalize types and ability to use techniques and skills needed for reducing stress and depression.  Initial Review & Psychosocial Screening: Initial Psych Review & Screening - 11/20/17 1134      Initial Review   Current issues with  None Identified      Family Dynamics   Good Support System?  Yes      Barriers   Psychosocial barriers to participate in program  There are no identifiable barriers or psychosocial needs.      Screening Interventions   Interventions  Encouraged to exercise    Expected Outcomes  Short Term goal: Identification and review with participant of any Quality of Life or Depression concerns found by scoring the questionnaire.;Long Term goal: The participant improves quality of Life and PHQ9 Scores as seen by post scores and/or verbalization of changes       Quality of Life Scores: Quality of Life - 11/20/17 1016      Quality of Life Scores   Health/Function Pre  14.23 %    Socioeconomic Pre  24.75 %    Psych/Spiritual Pre  21.36 %    Family Pre  22.5 %    GLOBAL Pre  18.83 %      Scores of 19 and below usually indicate a poorer quality of life in these areas.  A difference of  2-3 points is a clinically meaningful difference.  A difference of 2-3 points in the total score of the Quality of  Life Index has been associated with significant improvement in overall quality of life, self-image, physical symptoms, and general health in studies assessing change in quality of life.   PHQ-9: Recent Review Flowsheet Data    Depression screen [facility] 2/9 11/20/2017   Decreased Interest 1   Down, Depressed, Hopeless 0   PHQ - 2 Score 1   Altered sleeping 0   Tired, decreased energy 2   Change in appetite 0   Feeling bad or failure about yourself  0   Trouble concentrating 0   Moving slowly or fidgety/restless 0   Suicidal thoughts 0   PHQ-9 Score 3   Difficult doing work/chores Not difficult at all     Interpretation of Total Score  Total Score Depression Severity:  1-4 = Minimal depression, 5-9 = Mild depression, 10-14 = Moderate depression, 15-19 = Moderately severe depression, 20-27 = Severe depression   Psychosocial Evaluation and Intervention: Psychosocial Evaluation - 11/20/17 1145      Psychosocial Evaluation & Interventions   Interventions  Encouraged to exercise with the program and follow exercise prescription    Continue Psychosocial Services   Follow up required by staff       Psychosocial Re-Evaluation: Psychosocial Re-Evaluation    [place] Name 12/19/17 1449             Psychosocial Re-Evaluation   Current issues with  None Identified       Comments  Patient's initial QOL score was 18.83 and his PHQ-9 score was 3 with no psychosocial issues identified.  Expected Outcomes  Patient will have no psychosocial issues identified at discharge.        Interventions  Relaxation education;Stress management education;Encouraged to attend Pulmonary Rehabilitation for the exercise       Continue Psychosocial Services   No Follow up required          Psychosocial Discharge (Final Psychosocial Re-Evaluation): Psychosocial Re-Evaluation - 12/19/17 1449      Psychosocial Re-Evaluation   Current issues with  None Identified    Comments  Patient's initial QOL score was  18.83 and his PHQ-9 score was 3 with no psychosocial issues identified.     Expected Outcomes  Patient will have no psychosocial issues identified at discharge.     Interventions  Relaxation education;Stress management education;Encouraged to attend Pulmonary Rehabilitation for the exercise    Continue Psychosocial Services   No Follow up required        Education: Education Goals: Education classes will be provided on a weekly basis, covering required topics. Participant will state understanding/return demonstration of topics presented.  Learning Barriers/Preferences: Learning Barriers/Preferences - 11/20/17 2878      Learning Barriers/Preferences   Learning Barriers  None    Learning Preferences  Skilled Demonstration;Individual Instruction;Group Instruction       Education Topics: How Lungs Work and Diseases: - Discuss the anatomy of the lungs and diseases that can affect the lungs, such as COPD.   Exercise: -Discuss the importance of exercise, FITT principles of exercise, normal and abnormal responses to exercise, and how to exercise safely.   PULMONARY REHAB OTHER RESPIRATORY from 12/19/2017 in [place]  Date  12/19/17  Educator  [place]  Instruction Review Code  2- Demonstrated Understanding      Environmental Irritants: -Discuss types of environmental irritants and how to limit exposure to environmental irritants.   Meds/Inhalers and oxygen: - Discuss respiratory medications, definition of an inhaler and oxygen, and the proper way to use an inhaler and oxygen.   Energy Saving Techniques: - Discuss methods to conserve energy and decrease shortness of breath when performing activities of daily living.    Bronchial Hygiene / Breathing Techniques: - Discuss breathing mechanics, pursed-lip breathing technique,  proper posture, effective ways to clear airways, and other functional breathing techniques   Cleaning Equipment: - Provides group verbal  and written instruction about the health risks of elevated stress, cause of high stress, and healthy ways to reduce stress.   Nutrition I: Fats: - Discuss the types of cholesterol, what cholesterol does to the body, and how cholesterol levels can be controlled.   Nutrition II: Labels: -Discuss the different components of food labels and how to read food labels.   Respiratory Infections: - Discuss the signs and symptoms of respiratory infections, ways to prevent respiratory infections, and the importance of seeking medical treatment when having a respiratory infection.   Stress I: Signs and Symptoms: - Discuss the causes of stress, how stress may lead to anxiety and depression, and ways to limit stress.   PULMONARY REHAB OTHER RESPIRATORY from 12/19/2017 in [place]  Date  11/28/17  Educator  [place]  Instruction Review Code  2- Demonstrated Understanding      Stress II: Relaxation: -Discuss relaxation techniques to limit stress.   PULMONARY REHAB OTHER RESPIRATORY from 12/19/2017 in [place]  Date  12/05/17  Educator  [place]  Instruction Review Code  2- Demonstrated Understanding      Oxygen for Home/Travel: - Discuss how to prepare for  travel when on oxygen and proper ways to transport and store oxygen to ensure safety.   Knowledge Questionnaire Score: Knowledge Questionnaire Score - 11/20/17 1019      Knowledge Questionnaire Score   Pre Score  11/18       Core Components/Risk Factors/Patient Goals at Admission: Personal Goals and Risk Factors at Admission - 11/20/17 1132      Core Components/Risk Factors/Patient Goals on Admission    Weight Management  Weight Maintenance    Personal Goal Other  Yes    Personal Goal  Increase lung capacity, Strengthen legs, gain stability.     Intervention  Attend PR 2 x week and supplement at home exercise 3 x week.     Expected Outcomes  Reach goals.        Core Components/Risk  Factors/Patient Goals Review:  Goals and Risk Factor Review    Row Name 12/19/17 1446             Core Components/Risk Factors/Patient Goals Review   Personal Goals Review  Weight Management/Obesity Increase lung capacity; stengthen legs; gain stability.        Review  Patient has completed 8 sessions gaining 2 lbs which is needed since his BMI is 19.2. He says he was only able to take 10 to 12 steps without having to stop and rest before he started our program. He is now able to walk further and he is pleased with his progress so far. He hopes to gain more strength in his legs as he continues. Will continue to monitor for progress.        Expected Outcomes  Patient will continue to attend sessions and complete the program meeting his personal goals.           Core Components/Risk Factors/Patient Goals at Discharge (Final Review):  Goals and Risk Factor Review - 12/19/17 1446      Core Components/Risk Factors/Patient Goals Review   Personal Goals Review  Weight Management/Obesity Increase lung capacity; stengthen legs; gain stability.     Review  Patient has completed 8 sessions gaining 2 lbs which is needed since his BMI is 19.2. He says he was only able to take 10 to 12 steps without having to stop and rest before he started our program. He is now able to walk further and he is pleased with his progress so far. He hopes to gain more strength in his legs as he continues. Will continue to monitor for progress.     Expected Outcomes  Patient will continue to attend sessions and complete the program meeting his personal goals.        ITP Comments: ITP Comments    Row Name 11/20/17 1129 11/21/17 1250         ITP Comments  Peter Lopez is a pleasant 82 year old gentleman who is getting ready to start. He will have several other doctors appointments due to having lung cancer and prostate cancer. He is eager to get started.   Patient new to program. Plans to start Tuesday 11/26/17. Will continue  to monitor for progress.          Comments: ITP 30 Day REVIEW Pt is making expected progress toward pulmonary rehab goals after completing 8 sessions. Recommend continued exercise, life style modification, education, and utilization of breathing techniques to increase stamina and strength and decrease shortness of breath with exertion.

## 2017-12-19 NOTE — Progress Notes (Signed)
Daily Session Note  Patient Details  Name: Peter Lopez MRN: [id number] Date of Birth: 1936-06-05 Referring Provider:     PULMONARY REHAB COPD ORIENTATION from 11/20/2017 in [place]  Referring Provider  DR. Luan Pulling      Encounter Date: 12/19/2017  Check In: Session Check In - 12/19/17 1416      Check-In   Location  [facility]    Staff Present  Suzanne Boron, BS, EP, Exercise Physiologist;Diane Coad, MS, EP, CH[facility], Exercise Physiologist;Debra Wynetta Emery, RN, BSN    Supervising physician immediately available to respond to emergencies  See telemetry face sheet for immediately available MD    Medication changes reported      No    Fall or balance concerns reported     Yes    Comments  Fallen 1 ime in 12 months. He also gets weak in his knees at times    Warm-up and Cool-down  Performed as group-led instruction    Resistance Training Performed  Yes    VAD Patient?  No      Pain Assessment   Currently in Pain?  No/denies    Pain Score  0-No pain    Multiple Pain Sites  No       Capillary Blood Glucose: No results found for this or any previous visit (from the past 24 hour(s)).    Social History   Tobacco Use  Smoking Status Former Smoker  . Packs/day: 1.00  . Last attempt to quit: 12/11/1998  . Years since quitting: 19.0  Smokeless Tobacco Never Used    Goals Met:  Independence with exercise equipment Improved SOB with ADL's Using PLB without cueing & demonstrates good technique Exercise tolerated well No report of cardiac concerns or symptoms Strength training completed today  Goals Unmet:  Not Applicable  Comments: Check out 230   Dr. Sinda Du is Medical Director for [facility].

## 2017-12-24 ENCOUNTER — Encounter (HOSPITAL_COMMUNITY)
Admission: RE | Admit: 2017-12-24 | Discharge: 2017-12-24 | Disposition: A | Discharge status: Home / Self Care | Payer: PPO | Source: Ambulatory Visit | Attending: Pulmonary Disease | Admitting: Pulmonary Disease

## 2017-12-24 DIAGNOSIS — J449 Chronic obstructive pulmonary disease, unspecified: Secondary | ICD-10-CM | POA: Diagnosis not present

## 2017-12-24 NOTE — Progress Notes (Signed)
Daily Session Note  Patient Details  Name: Peter Lopez MRN: [id number] Date of Birth: Jun 08, 1936 Referring Provider:     PULMONARY REHAB COPD ORIENTATION from 11/20/2017 in [place]  Referring Provider  DR. Luan Pulling      Encounter Date: 12/24/2017  Check In: Session Check In - 12/24/17 1333      Check-In   Location  [facility]    Staff Present  Suzanne Boron, BS, EP, Exercise Physiologist;Diane Coad, MS, EP, [facility], Exercise Physiologist    Supervising physician immediately available to respond to emergencies  See telemetry face sheet for immediately available MD    Medication changes reported      No    Fall or balance concerns reported     Yes    Comments  Fallen 1 ime in 12 months. He also gets weak in his knees at times    Warm-up and Cool-down  Performed as group-led instruction    Resistance Training Performed  Yes    VAD Patient?  No      Pain Assessment   Currently in Pain?  No/denies    Pain Score  0-No pain    Multiple Pain Sites  No       Capillary Blood Glucose: No results found for this or any previous visit (from the past 24 hour(s)).  Exercise Prescription Changes - 12/24/17 1000      Response to Exercise   Blood Pressure (Admit)  136/62    Blood Pressure (Exercise)  146/74    Blood Pressure (Exit)  140/66    Heart Rate (Admit)  60 bpm    Heart Rate (Exercise)  79 bpm    Heart Rate (Exit)  75 bpm    Oxygen Saturation (Admit)  96 %    Oxygen Saturation (Exercise)  90 %    Oxygen Saturation (Exit)  97 %    Rating of Perceived Exertion (Exercise)  13    Perceived Dyspnea (Exercise)  13    Duration  Progress to 30 minutes of  aerobic without signs/symptoms of physical distress    Intensity  THRR New [phone]      Progression   Progression  Continue to progress workloads to maintain intensity without signs/symptoms of physical distress.      Resistance Training   Training Prescription  Yes    Weight  2    Reps  10-15      Treadmill   MPH  1.2    Grade  0    Minutes  15    METs  1.9      NuStep   Level  1    SPM  72    Minutes  20    METs  1.8      Home Exercise Plan   Plans to continue exercise at  Home (comment)    Frequency  Add 3 additional days to program exercise sessions.    Initial Home Exercises Provided  12/05/17       Social History   Tobacco Use  Smoking Status Former Smoker  . Packs/day: 1.00  . Last attempt to quit: 12/11/1998  . Years since quitting: 19.0  Smokeless Tobacco Never Used    Goals Met:  Independence with exercise equipment Improved SOB with ADL's Using PLB without cueing & demonstrates good technique Exercise tolerated well No report of cardiac concerns or symptoms Strength training completed today  Goals Unmet:  Not Applicable  Comments: Check out 230  Dr. Sinda Du is Medical Director for [facility].

## 2017-12-26 ENCOUNTER — Encounter (HOSPITAL_COMMUNITY): Payer: PPO

## 2017-12-31 ENCOUNTER — Encounter (HOSPITAL_COMMUNITY)
Admission: RE | Admit: 2017-12-31 | Discharge: 2017-12-31 | Disposition: A | Discharge status: Home / Self Care | Payer: PPO | Source: Ambulatory Visit | Attending: Pulmonary Disease | Admitting: Pulmonary Disease

## 2017-12-31 DIAGNOSIS — J449 Chronic obstructive pulmonary disease, unspecified: Secondary | ICD-10-CM

## 2017-12-31 NOTE — Progress Notes (Signed)
Daily Session Note  Patient Details  Name: Peter Lopez MRN: [id number] Date of Birth: [birth year] Referring Provider:     PULMONARY REHAB COPD ORIENTATION from 11/20/2017 in [place]  Referring Provider  DR. Luan Pulling      Encounter Date: 12/31/2017  Check In: Session Check In - 12/31/17 1420      Check-In   Location  [facility]    Staff Present  Suzanne Boron, BS, EP, Exercise Physiologist;Diane Coad, MS, EP, [facility], Exercise Physiologist    Supervising physician immediately available to respond to emergencies  See telemetry face sheet for immediately available MD    Medication changes reported      No    Fall or balance concerns reported     Yes    Comments  Fallen 1 ime in 12 months. He also gets weak in his knees at times    Warm-up and Cool-down  Performed as group-led instruction    Resistance Training Performed  Yes    VAD Patient?  No      Pain Assessment   Currently in Pain?  No/denies    Pain Score  0-No pain    Multiple Pain Sites  No       Capillary Blood Glucose: No results found for this or any previous visit (from the past 24 hour(s)).    Social History   Tobacco Use  Smoking Status Former Smoker  . Packs/day: 1.00  . Last attempt to quit: 12/11/1998  . Years since quitting: 19.0  Smokeless Tobacco Never Used    Goals Met:  Independence with exercise equipment Exercise tolerated well No report of cardiac concerns or symptoms Strength training completed today  Goals Unmet:  Not Applicable  Comments: Check out 230   Dr. Sinda Du is Medical Director for [facility].

## 2018-01-02 ENCOUNTER — Encounter (HOSPITAL_COMMUNITY): Payer: PPO

## 2018-01-07 ENCOUNTER — Encounter (HOSPITAL_COMMUNITY)
Admission: RE | Admit: 2018-01-07 | Discharge: 2018-01-07 | Disposition: A | Discharge status: Home / Self Care | Payer: PPO | Source: Ambulatory Visit | Attending: Pulmonary Disease | Admitting: Pulmonary Disease

## 2018-01-07 DIAGNOSIS — J449 Chronic obstructive pulmonary disease, unspecified: Secondary | ICD-10-CM

## 2018-01-07 NOTE — Progress Notes (Signed)
Daily Session Note  Patient Details  Name: Peter Lopez MRN: [id number] Date of Birth: 02/26/1936 Referring Provider:     PULMONARY REHAB COPD ORIENTATION from 11/20/2017 in [place]  Referring Provider  DR. Luan Pulling      Encounter Date: 01/07/2018  Check In: Session Check In - 01/07/18 1338      Check-In   Location  [facility]    Staff Present  Suzanne Boron, BS, EP, Exercise Physiologist;Diane Coad, MS, EP, [facility], Exercise Physiologist;Debra Wynetta Emery, RN, BSN    Supervising physician immediately available to respond to emergencies  See telemetry face sheet for immediately available MD    Medication changes reported      No    Fall or balance concerns reported     Yes    Comments  Fallen 1 ime in 12 months. He also gets weak in his knees at times    Warm-up and Cool-down  Performed as group-led instruction    Resistance Training Performed  Yes    VAD Patient?  No      Pain Assessment   Currently in Pain?  No/denies    Pain Score  0-No pain    Multiple Pain Sites  No       Capillary Blood Glucose: No results found for this or any previous visit (from the past 24 hour(s)).  Exercise Prescription Changes - 01/06/18 1500      Response to Exercise   Blood Pressure (Admit)  150/70    Blood Pressure (Exercise)  160/80    Blood Pressure (Exit)  142/70    Heart Rate (Admit)  71 bpm    Heart Rate (Exercise)  99 bpm    Heart Rate (Exit)  78 bpm    Oxygen Saturation (Admit)  96 %    Oxygen Saturation (Exercise)  93 %    Oxygen Saturation (Exit)  93 %    Rating of Perceived Exertion (Exercise)  10    Perceived Dyspnea (Exercise)  11    Duration  Progress to 30 minutes of  aerobic without signs/symptoms of physical distress    Intensity  THRR New [phone]      Progression   Progression  Continue to progress workloads to maintain intensity without signs/symptoms of physical distress.      Resistance Training   Training Prescription   Yes    Weight  2    Reps  10-15      Treadmill   MPH  1.2    Grade  0    Minutes  15    METs  1.9      NuStep   Level  1    SPM  84    Minutes  20    METs  1.9      Home Exercise Plan   Plans to continue exercise at  Home (comment)    Frequency  Add 3 additional days to program exercise sessions.    Initial Home Exercises Provided  12/05/17       Social History   Tobacco Use  Smoking Status Former Smoker  . Packs/day: 1.00  . Last attempt to quit: 12/11/1998  . Years since quitting: 19.0  Smokeless Tobacco Never Used    Goals Met:  Independence with exercise equipment Improved SOB with ADL's Using PLB without cueing & demonstrates good technique Exercise tolerated well No report of cardiac concerns or symptoms Strength training completed today  Goals Unmet:  Not Applicable  Comments:  Check out 230   Dr. Sinda Du is Medical Director for [facility].

## 2018-01-09 ENCOUNTER — Encounter (HOSPITAL_COMMUNITY)
Admission: RE | Admit: 2018-01-09 | Discharge: 2018-01-09 | Disposition: A | Discharge status: Home / Self Care | Payer: PPO | Source: Ambulatory Visit | Attending: Pulmonary Disease | Admitting: Pulmonary Disease

## 2018-01-09 DIAGNOSIS — J449 Chronic obstructive pulmonary disease, unspecified: Secondary | ICD-10-CM

## 2018-01-09 NOTE — Progress Notes (Signed)
Pulmonary Individual Treatment Plan  Patient Details  Name: Peter Lopez MRN: [id number] Date of Birth: [birth year] Referring Provider:     PULMONARY REHAB COPD ORIENTATION from 11/20/2017 in [place]  Referring Provider  DR. Hawkins      Initial Encounter Date:    PULMONARY REHAB COPD ORIENTATION from 11/20/2017 in [place]  Date  11/20/17  Referring Provider  DR. Hawkins      Visit Diagnosis: Chronic obstructive pulmonary disease, unspecified COPD type ([place])  Patient's Home Medications on Admission:   Current Outpatient Medications:  .  acetaminophen (TYLENOL) 500 MG tablet, Take 1,000 mg by mouth every 6 (six) hours as needed for moderate pain., Disp: , Rfl:  .  amLODipine-olmesartan (AZOR) 5-40 MG per tablet, Take 1 tablet by mouth daily., Disp: , Rfl:  .  aspirin EC 81 MG tablet, Take 81 mg by mouth daily., Disp: , Rfl:  .  Calcium Citrate-Vitamin D (CALCIUM + D PO), Take 1,200 mg by mouth daily., Disp: , Rfl:  .  enzalutamide (XTANDI) 40 MG capsule, Take 160 mg by mouth daily., Disp: , Rfl:  .  folic acid (FOLVITE) 1 MG tablet, Take 1 mg by mouth daily., Disp: , Rfl:  .  Leuprolide Acetate, 6 Month, (LUPRON DEPOT, 4-MONTH,) 45 MG injection, Inject 45 mg into the muscle every 6 (six) months., Disp: , Rfl:  .  methotrexate (RHEUMATREX) 2.5 MG tablet, Take 2.5 mg by mouth once a week. sunday, Disp: , Rfl:  .  potassium chloride SA (K-DUR,KLOR-CON) 20 MEQ tablet, take 2 tablets by mouth once daily, Disp: 60 tablet, Rfl: 6 .  propranolol (INDERAL) 40 MG tablet, Take 40 mg by mouth daily. , Disp: , Rfl:  .  simvastatin (ZOCOR) 40 MG tablet, Take 40 mg by mouth daily.  , Disp: , Rfl:  .  torsemide (DEMADEX) 20 MG tablet, Take 20 mg by mouth daily. , Disp: , Rfl:   Past Medical History: Past Medical History:  Diagnosis Date  . 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 Benign essential tremor   . Exertional dyspnea   . Hyperlipidemia    . Hypertension   . Pacemaker 11/24/2012   Dr Lovena Le  . Prostate cancer ([place])    S/P Prostatectomy  . Tobacco abuse    30 pack years; discontinued in 2000; currently one pack per week    Tobacco Use: Social History   Tobacco Use  Smoking Status Former Smoker  . Packs/day: 1.00  . Last attempt to quit: 12/11/1998  . Years since quitting: 19.0  Smokeless Tobacco Never Used    Labs: Recent Chemical engineer    Labs for ITP Cardiac and Pulmonary Rehab Latest Ref Rng & Units 05/03/2010 07/22/2010 12/07/2011 03/01/2016   Cholestrol 0 - 200 mg/dL - 159 163 -   LDLCALC 0 - 99 mg/dL - 89 86 -   HDL >39 mg/dL - 58 57 -   Trlycerides <150 mg/dL - 60 100 -   PHART 7.350 - 7.450 7.428 - - -   PCO2ART 35.0 - 45.0 mmHg 36.2 - - -   HCO3 20.0 - 24.0 mEq/L 23.5 - - 27.9(H)   TCO2 0 - 100 mmol/L 20.2 - - 29   O2SAT % 96.4 - - 62.0      Capillary Blood Glucose: No results found for: GLUCAP   Pulmonary Assessment Scores: Pulmonary Assessment Scores    Row Name 11/20/17 1122  ADL UCSD   ADL Phase  Entry     SOB Score total  31     Rest  0     Walk  7     Stairs  3     Bath  1     Dress  1     Shop  0       CAT Score   CAT Score  14       mMRC Score   mMRC Score  2        Pulmonary Function Assessment: Pulmonary Function Assessment - 11/20/17 1021      Pulmonary Function Tests   FVC%  3.86 %    FEV1%  2.74 %    FEV1/FVC Ratio  72    RV%  2.6 %    DLCO%  30.99 %      Initial Spirometry Results   FVC%  3.86 %    FEV1%  2.74 %    FEV1/FVC Ratio  72      Post Bronchodilator Spirometry Results   FVC%  94 %    FEV1%  70 %    FEV1/FVC Ratio  73    Comments  Actual FEV/FVC = 53      Breath   Bilateral Breath Sounds  Clear    Shortness of Breath  -- with exertion       Exercise Target Goals:    Exercise Program Goal: Individual exercise prescription set using results from initial 6 min walk test and THRR while considering  patient's activity  barriers and safety.   Exercise Prescription Goal: Initial exercise prescription builds to 30-45 minutes a day of aerobic activity, 2-3 days per week.  Home exercise guidelines will be given to patient during program as part of exercise prescription that the participant will acknowledge.  Activity Barriers & Risk Stratification: Activity Barriers & Cardiac Risk Stratification - 11/20/17 1012      Activity Barriers & Cardiac Risk Stratification   Activity Barriers  Other (comment);Joint Problems;Balance Concerns    Comments  Tremors    Cardiac Risk Stratification  High       6 Minute Walk: 6 Minute Walk    Row Name 11/20/17 1011         6 Minute Walk   Phase  Initial     Distance  750 feet     Distance % Change  0 %     Distance Feet Change  0 ft     Walk Time  6 minutes     # of Rest Breaks  1     MPH  1.42     METS  2.08     RPE  12     Perceived Dyspnea   13     VO2 Peak  7.56     Symptoms  No     Resting HR  70 bpm     Resting BP  126/70     Resting Oxygen Saturation   97 %     Exercise Oxygen Saturation  during 6 min walk  89 %     Max Ex. HR  104 bpm     Max Ex. BP  160/74     2 Minute Post BP  130/70        Oxygen Initial Assessment: Oxygen Initial Assessment - 11/20/17 1130      Home Oxygen   Home Oxygen Device  None    Sleep Oxygen Prescription  None  Home Exercise Oxygen Prescription  None    Home at Rest Exercise Oxygen Prescription  None      Program Oxygen Prescription   Program Oxygen Prescription  None       Oxygen Re-Evaluation: Oxygen Re-Evaluation    [place] Name 12/19/17 1443 01/09/18 1538           Program Oxygen Prescription   Program Oxygen Prescription  None  None        Home Oxygen   Home Oxygen Device  None  None      Sleep Oxygen Prescription  None  None      Home Exercise Oxygen Prescription  None  None      Home at Rest Exercise Oxygen Prescription  None  None        Goals/Expected Outcomes   Short Term Goals  To  learn and demonstrate proper pursed lip breathing techniques or other breathing techniques.;To learn and understand importance of maintaining oxygen saturations>88%;To learn and understand importance of monitoring SPO2 with pulse oximeter and demonstrate accurate use of the pulse oximeter.  To learn and demonstrate proper pursed lip breathing techniques or other breathing techniques.;To learn and understand importance of maintaining oxygen saturations>88%;To learn and understand importance of monitoring SPO2 with pulse oximeter and demonstrate accurate use of the pulse oximeter.      Long  Term Goals  Exhibits proper breathing techniques, such as pursed lip breathing or other method taught during program session;Maintenance of O2 saturations>88%;Verbalizes importance of monitoring SPO2 with pulse oximeter and return demonstration  -      Comments  Patient is able to demonstrate proper usage of pulse oximeter and pursed lip breathing during exercise. He also is able to verbalize the importance of maintaining his O2 sat level >88%.  Patient is able to demonstrate proper usage of pulse oximeter and pursed lip breathing during exercise. He also is able to verbalize the importance of maintaining his O2 sat level >88%.      Goals/Expected Outcomes  Patient will continue to meet his short and long term goals.   Patient will continue to meet his short and long term goals.          Oxygen Discharge (Final Oxygen Re-Evaluation): Oxygen Re-Evaluation - 01/09/18 1538      Program Oxygen Prescription   Program Oxygen Prescription  None      Home Oxygen   Home Oxygen Device  None    Sleep Oxygen Prescription  None    Home Exercise Oxygen Prescription  None    Home at Rest Exercise Oxygen Prescription  None      Goals/Expected Outcomes   Short Term Goals  To learn and demonstrate proper pursed lip breathing techniques or other breathing techniques.;To learn and understand importance of maintaining oxygen  saturations>88%;To learn and understand importance of monitoring SPO2 with pulse oximeter and demonstrate accurate use of the pulse oximeter.    Comments  Patient is able to demonstrate proper usage of pulse oximeter and pursed lip breathing during exercise. He also is able to verbalize the importance of maintaining his O2 sat level >88%.    Goals/Expected Outcomes  Patient will continue to meet his short and long term goals.        Initial Exercise Prescription: Initial Exercise Prescription - 11/20/17 1000      Date of Initial Exercise RX and Referring Provider   Date  11/20/17    Referring Provider  DR. Hawkins      Treadmill  MPH  1    Grade  0    Minutes  15    METs  1.7      NuStep   Level  1    SPM  46    Minutes  20    METs  1.8      Prescription Details   Frequency (times per week)  3    Duration  Progress to 30 minutes of continuous aerobic without signs/symptoms of physical distress      Intensity   THRR 40-80% of Max Heartrate  [phone]    Ratings of Perceived Exertion  11-13    Perceived Dyspnea  0-4      Progression   Progression  Continue progressive overload as per policy without signs/symptoms or physical distress.      Resistance Training   Training Prescription  Yes    Weight  1    Reps  10-15       Perform Capillary Blood Glucose checks as needed.  Exercise Prescription Changes:  Exercise Prescription Changes    Row Name 12/12/17 0800 12/24/17 1000 01/06/18 1500         Response to Exercise   Blood Pressure (Admit)  140/70  136/62  150/70     Blood Pressure (Exercise)  150/74  146/74  160/80     Blood Pressure (Exit)  140/70  140/66  142/70     Heart Rate (Admit)  66 bpm  60 bpm  71 bpm     Heart Rate (Exercise)  91 bpm  79 bpm  99 bpm     Heart Rate (Exit)  75 bpm  75 bpm  78 bpm     Oxygen Saturation (Admit)  95 %  96 %  96 %     Oxygen Saturation (Exercise)  94 %  90 %  93 %     Oxygen Saturation (Exit)  94 %  97 %  93 %      Rating of Perceived Exertion (Exercise)  '13  13  10     '$ Perceived Dyspnea (Exercise)  '14  13  11     '$ Duration  Progress to 30 minutes of  aerobic without signs/symptoms of physical distress  Progress to 30 minutes of  aerobic without signs/symptoms of physical distress  Progress to 30 minutes of  aerobic without signs/symptoms of physical distress     Intensity  THRR New [phone]  THRR New [phone]  THRR New [phone]       Progression   Progression  Continue to progress workloads to maintain intensity without signs/symptoms of physical distress.  Continue to progress workloads to maintain intensity without signs/symptoms of physical distress.  Continue to progress workloads to maintain intensity without signs/symptoms of physical distress.       Resistance Training   Training Prescription  Yes  Yes  Yes     Weight  '3  2  2     '$ Reps  10-15  10-15  10-15       Treadmill   MPH  1  1.2  1.2     Grade  0  0  0     Minutes  '15  15  15     '$ METs  1.7  1.9  1.9       NuStep   Level  '1  1  1     '$ SPM  87  72  84     Minutes  20  20  20     METs  1.7  1.8  1.9       Home Exercise Plan   Plans to continue exercise at  Home (comment)  Home (comment)  Home (comment)     Frequency  Add 3 additional days to program exercise sessions.  Add 3 additional days to program exercise sessions.  Add 3 additional days to program exercise sessions.     Initial Home Exercises Provided  12/05/17  12/05/17  12/05/17        Exercise Comments:  Exercise Comments    Row Name 12/12/17 0809 12/24/17 1021 01/06/18 1513       Exercise Comments  Patient is doing well in PR and has increased his SPMs on the nustep machine while maintaining his speed on the treadmill. Patient O2 levels are looking good while he is being active not often dropping below 90. We ar eworking on increasing his stability levels by helping him keep his balance on the treadmill by holding on to the support bars. We will continue to  monitor the patient throughout the remainder of the program.   Patient continues to do well in PR. Patient has increased his speed on the treadmill to 1.2. Patient has also been maintaining his level and SPMs on the machine. Patient seems to be enjoying the program and has stated that he is feeling some energy coming back to him since he is only on 8 sessions of the program.   Patient continues to do well in PR. Patient has maintained his speed on the treadmill and has also maintained his level on the nustep machine. patient has increased his SPMs and METS on the nustep. Patient stated that He has better mobility since beginning the program and can breathe better now.         Exercise Goals and Review:  Exercise Goals    Row Name 11/20/17 1015             Exercise Goals   Increase Physical Activity  Yes       Intervention  Provide advice, education, support and counseling about physical activity/exercise needs.;Develop an individualized exercise prescription for aerobic and resistive training based on initial evaluation findings, risk stratification, comorbidities and participant's personal goals.       Expected Outcomes  Short Term: Attend rehab on a regular basis to increase amount of physical activity.       Increase Strength and Stamina  Yes       Intervention  Provide advice, education, support and counseling about physical activity/exercise needs.;Develop an individualized exercise prescription for aerobic and resistive training based on initial evaluation findings, risk stratification, comorbidities and participant's personal goals.       Expected Outcomes  Short Term: Increase workloads from initial exercise prescription for resistance, speed, and METs.;Long Term: Improve cardiorespiratory fitness, muscular endurance and strength as measured by increased METs and functional capacity (6MWT);Short Term: Perform resistance training exercises routinely during rehab and add in resistance  training at home       Able to understand and use rate of perceived exertion (RPE) scale  Yes       Intervention  Provide education and explanation on how to use RPE scale       Expected Outcomes  Short Term: Able to use RPE daily in rehab to express subjective intensity level;Long Term:  Able to use RPE to guide intensity level when exercising independently       Able to understand and use  Dyspnea scale  Yes       Intervention  Provide education and explanation on how to use Dyspnea scale       Expected Outcomes  Short Term: Able to use Dyspnea scale daily in rehab to express subjective sense of shortness of breath during exertion;Long Term: Able to use Dyspnea scale to guide intensity level when exercising independently       Knowledge and understanding of Target Heart Rate Range (THRR)  Yes       Intervention  Provide education and explanation of THRR including how the numbers were predicted and where they are located for reference       Expected Outcomes  Short Term: Able to state/look up THRR;Long Term: Able to use THRR to govern intensity when exercising independently;Short Term: Able to use daily as guideline for intensity in rehab       Able to check pulse independently  Yes       Intervention  Provide education and demonstration on how to check pulse in carotid and radial arteries.;Review the importance of being able to check your own pulse for safety during independent exercise       Expected Outcomes  Short Term: Able to explain why pulse checking is important during independent exercise;Long Term: Able to check pulse independently and accurately       Understanding of Exercise Prescription  Yes       Intervention  Provide education, explanation, and written materials on patient's individual exercise prescription       Expected Outcomes  Short Term: Able to explain program exercise prescription;Long Term: Able to explain home exercise prescription to exercise independently           Exercise Goals Re-Evaluation : Exercise Goals Re-Evaluation    Row Name 12/16/17 1508 01/06/18 1514           Exercise Goal Re-Evaluation   Exercise Goals Review  Increase Physical Activity;Increase Strength and Stamina;Able to understand and use rate of perceived exertion (RPE) scale;Able to check pulse independently;Knowledge and understanding of Target Heart Rate Range (THRR);Able to understand and use Dyspnea scale;Understanding of Exercise Prescription  Increase Physical Activity;Increase Strength and Stamina;Able to understand and use rate of perceived exertion (RPE) scale;Able to check pulse independently;Knowledge and understanding of Target Heart Rate Range (THRR);Able to understand and use Dyspnea scale;Understanding of Exercise Prescription      Comments  Patient has been doing well since beginning the program. Patient is only on sixth session but has shown great potential on the nustep machine gaining an average SPMs of 87 so far. Patient has also maintained his speed on the treadmill and has kept his O2 level above 90 while on it. Patients goals are being worked on becoming met. Patient will be continued to be monitored throughout the remainder of the program.   Patient continues to do well in PR. Patient has maintained his speed on the treadmill and has also maintained his level on the nustep machine. patient has increased his SPMs and METS on the nustep. Patient stated that He has better mobility since beginning the program and can breathe better now.       Expected Outcomes  Patient wishes to increase his lung capacity and to strength legs as well as gain stability.   Patient wishes to increase his lung capacity and to strength legs as well as gain stability.          Discharge Exercise Prescription (Final Exercise Prescription Changes): Exercise Prescription Changes -  01/06/18 1500      Response to Exercise   Blood Pressure (Admit)  150/70    Blood Pressure (Exercise)   160/80    Blood Pressure (Exit)  142/70    Heart Rate (Admit)  71 bpm    Heart Rate (Exercise)  99 bpm    Heart Rate (Exit)  78 bpm    Oxygen Saturation (Admit)  96 %    Oxygen Saturation (Exercise)  93 %    Oxygen Saturation (Exit)  93 %    Rating of Perceived Exertion (Exercise)  10    Perceived Dyspnea (Exercise)  11    Duration  Progress to 30 minutes of  aerobic without signs/symptoms of physical distress    Intensity  THRR New [phone]      Progression   Progression  Continue to progress workloads to maintain intensity without signs/symptoms of physical distress.      Resistance Training   Training Prescription  Yes    Weight  2    Reps  10-15      Treadmill   MPH  1.2    Grade  0    Minutes  15    METs  1.9      NuStep   Level  1    SPM  84    Minutes  20    METs  1.9      Home Exercise Plan   Plans to continue exercise at  Home (comment)    Frequency  Add 3 additional days to program exercise sessions.    Initial Home Exercises Provided  12/05/17       Nutrition:  Target Goals: Understanding of nutrition guidelines, daily intake of sodium '1500mg'$ , cholesterol '200mg'$ , calories 30% from fat and 7% or less from saturated fats, daily to have 5 or more servings of fruits and vegetables.  Biometrics: Pre Biometrics - 11/20/17 1015      Pre Biometrics   Height  '5\' 9"'$  (1.753 m)    Weight  134 lb 14.4 oz (61.2 kg)    Waist Circumference  32 inches    Hip Circumference  36 inches    Waist to Hip Ratio  0.89 %    BMI (Calculated)  19.91    Triceps Skinfold  10 mm    % Body Fat  19.7 %    Grip Strength  44.53 kg    Flexibility  0 in    Single Leg Stand  0 seconds        Nutrition Therapy Plan and Nutrition Goals: Nutrition Therapy & Goals - 11/21/17 1524      Personal Nutrition Goals   Nutrition Goal  For heart healthy choices add >50% of whole grains, make half their plate fruits and vegetables. Discuss the difference between starchy vegetables and  leafy greens, and how leafy vegetables provide fiber, helps maintain healthy weight, helps control blood glucose, and lowers cholesterol.  Discuss purchasing fresh or frozen vegetable to reduce sodium and not to add grease, fat or sugar. Consume <18oz of red meat per week. Consume lean cuts of meats and very little of meats high in sodium and nitrates such as pork and lunch meats. Discussed portion control for all food groups.      Personal Goal #2  He eats very little. When is eating he eats a heart healthy diet.     Comments  Patient met with RD 11/21/17.      Intervention Plan   Intervention  Nutrition  handout(s) given to patient.    Expected Outcomes  Short Term Goal: Understand basic principles of dietary content, such as calories, fat, sodium, cholesterol and nutrients.       Nutrition Assessments: Nutrition Assessments - 11/20/17 1132      MEDFICTS Scores   Pre Score  3       Nutrition Goals Re-Evaluation: Nutrition Goals Re-Evaluation    [place] Name 12/19/17 1445 01/09/18 1538           Goals   Current Weight  136 lb (61.7 kg)  135 lb (61.2 kg)      Nutrition Goal  For heart healthy choices add >50% of whole grains, make half their plate fruits and vegetables. Discuss the difference between starchy vegetables and leafy greens, and how leafy vegetables provide fiber, helps maintain healthy weight, helps control blood glucose, and lowers cholesterol.  Discuss purchasing fresh or frozen vegetable to reduce sodium and not to add grease, fat or sugar. Consume <18oz of red meat per week. Consume lean cuts of meats and very little of meats high in sodium and nitrates such as pork and lunch meats. Discussed portion control for all food groups.    For heart healthy choices add >50% of whole grains, make half their plate fruits and vegetables. Discuss the difference between starchy vegetables and leafy greens, and how leafy vegetables provide fiber, helps maintain healthy weight, helps control  blood glucose, and lowers cholesterol.  Discuss purchasing fresh or frozen vegetable to reduce sodium and not to add grease, fat or sugar. Consume <18oz of red meat per week. Consume lean cuts of meats and very little of meats high in sodium and nitrates such as pork and lunch meats. Discussed portion control for all food groups.        Comment  Patient has gained 2 lbs since his initial visit. He says he eat a healthy diet. Will continue to monitor.   Patient has lost 1 lb since last 30 day review. He continues to say he is trying to eat a healthy diet. Will continue to monitor for progress.       Expected Outcome  -  Patient will continue to meet his nutritional goals.         Personal Goal #2 Re-Evaluation   Personal Goal #2  He eats very little. When is eating he eats a heart healthy diet.   He eats very little. When is eating he eats a heart healthy diet.          Nutrition Goals Discharge (Final Nutrition Goals Re-Evaluation): Nutrition Goals Re-Evaluation - 01/09/18 1538      Goals   Current Weight  135 lb (61.2 kg)    Nutrition Goal  For heart healthy choices add >50% of whole grains, make half their plate fruits and vegetables. Discuss the difference between starchy vegetables and leafy greens, and how leafy vegetables provide fiber, helps maintain healthy weight, helps control blood glucose, and lowers cholesterol.  Discuss purchasing fresh or frozen vegetable to reduce sodium and not to add grease, fat or sugar. Consume <18oz of red meat per week. Consume lean cuts of meats and very little of meats high in sodium and nitrates such as pork and lunch meats. Discussed portion control for all food groups.      Comment  Patient has lost 1 lb since last 30 day review. He continues to say he is trying to eat a healthy diet. Will continue to monitor for progress.  Expected Outcome  Patient will continue to meet his nutritional goals.       Personal Goal #2 Re-Evaluation   Personal Goal #2   He eats very little. When is eating he eats a heart healthy diet.        Psychosocial: Target Goals: Acknowledge presence or absence of significant depression and/or stress, maximize coping skills, provide positive support system. Participant is able to verbalize types and ability to use techniques and skills needed for reducing stress and depression.  Initial Review & Psychosocial Screening: Initial Psych Review & Screening - 11/20/17 1134      Initial Review   Current issues with  None Identified      Family Dynamics   Good Support System?  Yes      Barriers   Psychosocial barriers to participate in program  There are no identifiable barriers or psychosocial needs.      Screening Interventions   Interventions  Encouraged to exercise    Expected Outcomes  Short Term goal: Identification and review with participant of any Quality of Life or Depression concerns found by scoring the questionnaire.;Long Term goal: The participant improves quality of Life and PHQ9 Scores as seen by post scores and/or verbalization of changes       Quality of Life Scores: Quality of Life - 11/20/17 1016      Quality of Life Scores   Health/Function Pre  14.23 %    Socioeconomic Pre  24.75 %    Psych/Spiritual Pre  21.36 %    Family Pre  22.5 %    GLOBAL Pre  18.83 %      Scores of 19 and below usually indicate a poorer quality of life in these areas.  A difference of  2-3 points is a clinically meaningful difference.  A difference of 2-3 points in the total score of the Quality of Life Index has been associated with significant improvement in overall quality of life, self-image, physical symptoms, and general health in studies assessing change in quality of life.   PHQ-9: Recent Review Flowsheet Data    Depression screen [facility] 2/9 11/20/2017   Decreased Interest 1   Down, Depressed, Hopeless 0   PHQ - 2 Score 1   Altered sleeping 0   Tired, decreased energy 2   Change in appetite 0   Feeling  bad or failure about yourself  0   Trouble concentrating 0   Moving slowly or fidgety/restless 0   Suicidal thoughts 0   PHQ-9 Score 3   Difficult doing work/chores Not difficult at all     Interpretation of Total Score  Total Score Depression Severity:  1-4 = Minimal depression, 5-9 = Mild depression, 10-14 = Moderate depression, 15-19 = Moderately severe depression, 20-27 = Severe depression   Psychosocial Evaluation and Intervention: Psychosocial Evaluation - 11/20/17 1145      Psychosocial Evaluation & Interventions   Interventions  Encouraged to exercise with the program and follow exercise prescription    Continue Psychosocial Services   Follow up required by staff       Psychosocial Re-Evaluation: Psychosocial Re-Evaluation    [place] Name 12/19/17 1449 01/09/18 1543           Psychosocial Re-Evaluation   Current issues with  None Identified  None Identified      Comments  Patient's initial QOL score was 18.83 and his PHQ-9 score was 3 with no psychosocial issues identified.   Patient's initial QOL score was 18.83 and his  PHQ-9 score was 3 with no psychosocial issues identified.       Expected Outcomes  Patient will have no psychosocial issues identified at discharge.   Patient will have no psychosocial issues identified at discharge.       Interventions  Relaxation education;Stress management education;Encouraged to attend Pulmonary Rehabilitation for the exercise  Relaxation education;Stress management education;Encouraged to attend Pulmonary Rehabilitation for the exercise      Continue Psychosocial Services   No Follow up required  No Follow up required         Psychosocial Discharge (Final Psychosocial Re-Evaluation): Psychosocial Re-Evaluation - 01/09/18 1543      Psychosocial Re-Evaluation   Current issues with  None Identified    Comments  Patient's initial QOL score was 18.83 and his PHQ-9 score was 3 with no psychosocial issues identified.     Expected Outcomes   Patient will have no psychosocial issues identified at discharge.     Interventions  Relaxation education;Stress management education;Encouraged to attend Pulmonary Rehabilitation for the exercise    Continue Psychosocial Services   No Follow up required        Education: Education Goals: Education classes will be provided on a weekly basis, covering required topics. Participant will state understanding/return demonstration of topics presented.  Learning Barriers/Preferences: Learning Barriers/Preferences - 11/20/17 3875      Learning Barriers/Preferences   Learning Barriers  None    Learning Preferences  Skilled Demonstration;Individual Instruction;Group Instruction       Education Topics: How Lungs Work and Diseases: - Discuss the anatomy of the lungs and diseases that can affect the lungs, such as COPD.   Exercise: -Discuss the importance of exercise, FITT principles of exercise, normal and abnormal responses to exercise, and how to exercise safely.   PULMONARY REHAB OTHER RESPIRATORY from 01/09/2018 in [place]  Date  12/19/17  Educator  Stella  Instruction Review Code  2- Demonstrated Understanding      Environmental Irritants: -Discuss types of environmental irritants and how to limit exposure to environmental irritants.   Meds/Inhalers and oxygen: - Discuss respiratory medications, definition of an inhaler and oxygen, and the proper way to use an inhaler and oxygen.   PULMONARY REHAB OTHER RESPIRATORY from 01/09/2018 in [place]  Date  01/09/18  Educator  DC      Energy Saving Techniques: - Discuss methods to conserve energy and decrease shortness of breath when performing activities of daily living.    Bronchial Hygiene / Breathing Techniques: - Discuss breathing mechanics, pursed-lip breathing technique,  proper posture, effective ways to clear airways, and other functional breathing techniques   Cleaning  Equipment: - Provides group verbal and written instruction about the health risks of elevated stress, cause of high stress, and healthy ways to reduce stress.   Nutrition I: Fats: - Discuss the types of cholesterol, what cholesterol does to the body, and how cholesterol levels can be controlled.   Nutrition II: Labels: -Discuss the different components of food labels and how to read food labels.   Respiratory Infections: - Discuss the signs and symptoms of respiratory infections, ways to prevent respiratory infections, and the importance of seeking medical treatment when having a respiratory infection.   Stress I: Signs and Symptoms: - Discuss the causes of stress, how stress may lead to anxiety and depression, and ways to limit stress.   PULMONARY REHAB OTHER RESPIRATORY from 01/09/2018 in [place]  Date  11/28/17  Educator  Many  Instruction Review Code  2- Demonstrated Understanding      Stress II: Relaxation: -Discuss relaxation techniques to limit stress.   PULMONARY REHAB OTHER RESPIRATORY from 01/09/2018 in [place]  Date  12/05/17  Educator  [place]  Instruction Review Code  2- Demonstrated Understanding      Oxygen for Home/Travel: - Discuss how to prepare for travel when on oxygen and proper ways to transport and store oxygen to ensure safety.   Knowledge Questionnaire Score: Knowledge Questionnaire Score - 11/20/17 1019      Knowledge Questionnaire Score   Pre Score  11/18       Core Components/Risk Factors/Patient Goals at Admission: Personal Goals and Risk Factors at Admission - 11/20/17 1132      Core Components/Risk Factors/Patient Goals on Admission    Weight Management  Weight Maintenance    Personal Goal Other  Yes    Personal Goal  Increase lung capacity, Strengthen legs, gain stability.     Intervention  Attend PR 2 x week and supplement at home exercise 3 x week.     Expected Outcomes  Reach goals.         Core Components/Risk Factors/Patient Goals Review:  Goals and Risk Factor Review    Row Name 12/19/17 1446 01/09/18 1539           Core Components/Risk Factors/Patient Goals Review   Personal Goals Review  Weight Management/Obesity Increase lung capacity; stengthen legs; gain stability.   Weight Management/Obesity Increase lung capacity; strengthen legs; gain stability.       Review  Patient has completed 8 sessions gaining 2 lbs which is needed since his BMI is 19.2. He says he was only able to take 10 to 12 steps without having to stop and rest before he started our program. He is now able to walk further and he is pleased with his progress so far. He hopes to gain more strength in his legs as he continues. Will continue to monitor for progress.   Patient has completed 12 sessions maintaining his weight since last 30 day review. He continues to say the program has helped him mainly walk better and he is able to walk longer distance. He can not tell a difference with his breathing yet but he feels that the program is helping him. Will continue to monitor for progress.        Expected Outcomes  Patient will continue to attend sessions and complete the program meeting his personal goals.   Patient will continue to attend sessions and complete the program meeting his personal goals.          Core Components/Risk Factors/Patient Goals at Discharge (Final Review):  Goals and Risk Factor Review - 01/09/18 1539      Core Components/Risk Factors/Patient Goals Review   Personal Goals Review  Weight Management/Obesity Increase lung capacity; strengthen legs; gain stability.     Review  Patient has completed 12 sessions maintaining his weight since last 30 day review. He continues to say the program has helped him mainly walk better and he is able to walk longer distance. He can not tell a difference with his breathing yet but he feels that the program is helping him. Will continue to monitor for  progress.      Expected Outcomes  Patient will continue to attend sessions and complete the program meeting his personal goals.        ITP Comments: ITP Comments    Row Name 11/20/17 1129  11/21/17 1250         ITP Comments  Mr. Fellman is a pleasant 82 year old gentleman who is getting ready to start. He will have several other doctors appointments due to having lung cancer and prostate cancer. He is eager to get started.   Patient new to program. Plans to start Tuesday 11/26/17. Will continue to monitor for progress.          Comments: ITP 30 Day REVIEW Pt is making expected progress toward pulmonary rehab goals after completing 12 sessions. Recommend continued exercise, life style modification, education, and utilization of breathing techniques to increase stamina and strength and decrease shortness of breath with exertion.

## 2018-01-09 NOTE — Progress Notes (Signed)
Daily Session Note  Patient Details  Name: Peter Lopez MRN: [id number] Date of Birth: 1936/03/14 Referring Provider:     PULMONARY REHAB COPD ORIENTATION from 11/20/2017 in [place]  Referring Provider  DR. Luan Pulling      Encounter Date: 01/09/2018  Check In: Session Check In - 01/09/18 1357      Check-In   Location  [facility]    Staff Present  Suzanne Boron, BS, EP, Exercise Physiologist;Diane Coad, MS, EP, CHC, Exercise Physiologist;Debra Wynetta Emery, RN, BSN    Supervising physician immediately available to respond to emergencies  See telemetry face sheet for immediately available MD    Medication changes reported      No    Fall or balance concerns reported     Yes    Comments  Fallen 1 ime in 12 months. He also gets weak in his knees at times    Warm-up and Cool-down  Performed as group-led instruction    Resistance Training Performed  Yes    VAD Patient?  No      Pain Assessment   Currently in Pain?  No/denies    Pain Score  0-No pain    Multiple Pain Sites  No       Capillary Blood Glucose: No results found for this or any previous visit (from the past 24 hour(s)).    Social History   Tobacco Use  Smoking Status Former Smoker  . Packs/day: 1.00  . Last attempt to quit: 12/11/1998  . Years since quitting: 19.0  Smokeless Tobacco Never Used    Goals Met:  Independence with exercise equipment Improved SOB with ADL's Using PLB without cueing & demonstrates good technique Exercise tolerated well No report of cardiac concerns or symptoms Strength training completed today  Goals Unmet:  Not Applicable  Comments: Check out 230   Dr. Sinda Du is Medical Director for [facility] Pulmonary [facility].

## 2018-01-14 ENCOUNTER — Encounter (HOSPITAL_COMMUNITY)
Admission: RE | Admit: 2018-01-14 | Discharge: 2018-01-14 | Disposition: A | Discharge status: Home / Self Care | Payer: PPO | Source: Ambulatory Visit | Attending: Pulmonary Disease | Admitting: Pulmonary Disease

## 2018-01-14 DIAGNOSIS — J449 Chronic obstructive pulmonary disease, unspecified: Secondary | ICD-10-CM | POA: Diagnosis not present

## 2018-01-14 NOTE — Progress Notes (Signed)
Daily Session Note  Patient Details  Name: Peter Lopez MRN: [id number] Date of Birth: [birth year] Referring Provider:     PULMONARY REHAB COPD ORIENTATION from 11/20/2017 in [place]  Referring Provider  DR. Luan Pulling      Encounter Date: 01/14/2018  Check In: Session Check In - 01/14/18 1412      Check-In   Location  [facility]    Staff Present  Suzanne Boron, BS, EP, Exercise Physiologist;Diane Coad, MS, EP, [facility], Exercise Physiologist    Supervising physician immediately available to respond to emergencies  See telemetry face sheet for immediately available MD    Medication changes reported      No    Fall or balance concerns reported     Yes    Comments  Fallen 1 ime in 12 months. He also gets weak in his knees at times    Warm-up and Cool-down  Performed as group-led instruction    Resistance Training Performed  Yes    VAD Patient?  No      Pain Assessment   Currently in Pain?  No/denies    Pain Score  0-No pain    Multiple Pain Sites  No       Capillary Blood Glucose: No results found for this or any previous visit (from the past 24 hour(s)).    Social History   Tobacco Use  Smoking Status Former Smoker  . Packs/day: 1.00  . Last attempt to quit: 12/11/1998  . Years since quitting: 19.1  Smokeless Tobacco Never Used    Goals Met:  Independence with exercise equipment Improved SOB with ADL's Using PLB without cueing & demonstrates good technique Exercise tolerated well Personal goals reviewed No report of cardiac concerns or symptoms Strength training completed today  Goals Unmet:  Not Applicable  Comments: Check out 230   Dr. Sinda Du is Medical Director for [facility] Pul[facility] Re[facility].

## 2018-01-16 ENCOUNTER — Encounter (HOSPITAL_COMMUNITY)
Admission: RE | Admit: 2018-01-16 | Discharge: 2018-01-16 | Disposition: A | Discharge status: Home / Self Care | Payer: PPO | Source: Ambulatory Visit | Attending: Pulmonary Disease | Admitting: Pulmonary Disease

## 2018-01-16 DIAGNOSIS — I10 Essential (primary) hypertension: Secondary | ICD-10-CM | POA: Diagnosis not present

## 2018-01-16 DIAGNOSIS — J449 Chronic obstructive pulmonary disease, unspecified: Secondary | ICD-10-CM

## 2018-01-16 DIAGNOSIS — I5032 Chronic diastolic (congestive) heart failure: Secondary | ICD-10-CM | POA: Diagnosis not present

## 2018-01-16 DIAGNOSIS — E785 Hyperlipidemia, unspecified: Secondary | ICD-10-CM | POA: Diagnosis not present

## 2018-01-16 DIAGNOSIS — R739 Hyperglycemia, unspecified: Secondary | ICD-10-CM | POA: Diagnosis not present

## 2018-01-16 NOTE — Progress Notes (Signed)
Daily Session Note  Patient Details  Name: Peter Lopez MRN: [id number] Date of Birth: 1935/11/20 Referring Provider:     PULMONARY REHAB COPD ORIENTATION from 11/20/2017 in [place]  Referring Provider  DR. Luan Pulling      Encounter Date: 01/16/2018  Check In: Session Check In - 01/16/18 1338      Check-In   Location  [facility]    Staff Present  Suzanne Boron, BS, EP, Exercise Physiologist;Diane Coad, MS, EP, CHC, Exercise Physiologist;Debra Wynetta Emery, RN, BSN    Supervising physician immediately available to respond to emergencies  See telemetry face sheet for immediately available MD    Medication changes reported      No    Fall or balance concerns reported     Yes    Comments  Fallen 1 ime in 12 months. He also gets weak in his knees at times    Warm-up and Cool-down  Performed as group-led instruction    Resistance Training Performed  Yes    VAD Patient?  No      Pain Assessment   Currently in Pain?  No/denies    Pain Score  0-No pain    Multiple Pain Sites  No       Capillary Blood Glucose: No results found for this or any previous visit (from the past 24 hour(s)).    Social History   Tobacco Use  Smoking Status Former Smoker  . Packs/day: 1.00  . Last attempt to quit: 12/11/1998  . Years since quitting: 19.1  Smokeless Tobacco Never Used    Goals Met:  Independence with exercise equipment Improved SOB with ADL's Using PLB without cueing & demonstrates good technique Exercise tolerated well No report of cardiac concerns or symptoms Strength training completed today  Goals Unmet:  Not Applicable  Comments: Check out 230   Dr. Sinda Du is Medical Director for [facility] Pulmonary Rehab.

## 2018-01-17 DIAGNOSIS — C3431 Malignant neoplasm of lower lobe, right bronchus or lung: Secondary | ICD-10-CM | POA: Diagnosis not present

## 2018-01-17 DIAGNOSIS — C61 Malignant neoplasm of prostate: Secondary | ICD-10-CM | POA: Diagnosis not present

## 2018-01-17 DIAGNOSIS — Z5111 Encounter for antineoplastic chemotherapy: Secondary | ICD-10-CM | POA: Diagnosis not present

## 2018-01-17 DIAGNOSIS — Z192 Hormone resistant malignancy status: Secondary | ICD-10-CM | POA: Diagnosis not present

## 2018-01-17 DIAGNOSIS — F17210 Nicotine dependence, cigarettes, uncomplicated: Secondary | ICD-10-CM | POA: Diagnosis not present

## 2018-01-17 DIAGNOSIS — J439 Emphysema, unspecified: Secondary | ICD-10-CM | POA: Diagnosis not present

## 2018-01-21 ENCOUNTER — Encounter: Payer: Self-pay | Admitting: Internal Medicine

## 2018-01-21 ENCOUNTER — Encounter (HOSPITAL_COMMUNITY)
Admission: RE | Admit: 2018-01-21 | Discharge: 2018-01-21 | Disposition: A | Discharge status: Home / Self Care | Payer: PPO | Source: Ambulatory Visit | Attending: Pulmonary Disease | Admitting: Pulmonary Disease

## 2018-01-21 DIAGNOSIS — J449 Chronic obstructive pulmonary disease, unspecified: Secondary | ICD-10-CM | POA: Diagnosis not present

## 2018-01-21 NOTE — Progress Notes (Signed)
Daily Session Note  Patient Details  Name: Peter Lopez MRN: [id number] Date of Birth: 07/11/1936 Referring Provider:     PULMONARY REHAB COPD ORIENTATION from 11/20/2017 in [place] [facility]  Referring Provider  DR. Hawkins      Encounter Date: 01/21/2018  Check In: Session Check In - 01/21/18 1421      Check-In   Location  [facility]    Staff Present  Vinita Prentiss, BS, EP, Exercise Physiologist;Diane Coad, MS, EP, CHC, Exercise Physiologist    Supervising physician immediately available to respond to emergencies  See telemetry face sheet for immediately available MD    Medication changes reported      No    Fall or balance concerns reported     Yes    Comments  Fallen 1 ime in 12 months. He also gets weak in his knees at times    Warm-up and Cool-down  Performed as group-led instruction    Resistance Training Performed  Yes    VAD Patient?  No      Pain Assessment   Currently in Pain?  No/denies    Pain Score  0-No pain    Multiple Pain Sites  No       Capillary Blood Glucose: No results found for this or any previous visit (from the past 24 hour(s)).    Social History   Tobacco Use  Smoking Status Former Smoker  . Packs/day: 1.00  . Last attempt to quit: 12/11/1998  . Years since quitting: 19.1  Smokeless Tobacco Never Used    Goals Met:  Independence with exercise equipment Improved SOB with ADL's Using PLB without cueing & demonstrates good technique Exercise tolerated well Personal goals reviewed No report of cardiac concerns or symptoms Strength training completed today  Goals Unmet:  Not Applicable  Comments: Check out 230   Dr. Edward Hawkins is Medical Director for [place]. 

## 2018-01-23 ENCOUNTER — Encounter (HOSPITAL_COMMUNITY)
Admission: RE | Admit: 2018-01-23 | Discharge: 2018-01-23 | Disposition: A | Discharge status: Home / Self Care | Payer: PPO | Source: Ambulatory Visit | Attending: Pulmonary Disease | Admitting: Pulmonary Disease

## 2018-01-23 ENCOUNTER — Ambulatory Visit (INDEPENDENT_AMBULATORY_CARE_PROVIDER_SITE_OTHER): Payer: PPO | Admitting: *Deleted

## 2018-01-23 DIAGNOSIS — I495 Sick sinus syndrome: Secondary | ICD-10-CM | POA: Diagnosis not present

## 2018-01-23 DIAGNOSIS — J449 Chronic obstructive pulmonary disease, unspecified: Secondary | ICD-10-CM | POA: Diagnosis not present

## 2018-01-23 NOTE — Progress Notes (Signed)
Daily Session Note  Patient Details  Name: Peter Lopez MRN: [id number] Date of Birth: 1936/06/08 Referring Provider:     PULMONARY REHAB COPD ORIENTATION from 11/20/2017 in [place]  Referring Provider  DR. Luan Pulling      Encounter Date: 01/23/2018  Check In: Session Check In - 01/23/18 1330      Check-In   Location  [facility]    Staff Present  Diane Angelina Pih, MS, EP, [facility], Exercise Physiologist;Iviana Blasingame Wynetta Emery, RN, BSN    Supervising physician immediately available to respond to emergencies  See telemetry face sheet for immediately available MD    Medication changes reported      No    Fall or balance concerns reported     Yes    Comments  Fallen 1 ime in 12 months. He also gets weak in his knees at times    Warm-up and Cool-down  Performed as group-led instruction    Resistance Training Performed  Yes    VAD Patient?  No      Pain Assessment   Currently in Pain?  No/denies    Pain Score  0-No pain    Multiple Pain Sites  No       Capillary Blood Glucose: No results found for this or any previous visit (from the past 24 hour(s)).    Social History   Tobacco Use  Smoking Status Former Smoker  . Packs/day: 1.00  . Last attempt to quit: 12/11/1998  . Years since quitting: 19.1  Smokeless Tobacco Never Used    Goals Met:  Proper associated with RPD/PD & O2 Sat Independence with exercise equipment Improved SOB with ADL's Using PLB without cueing & demonstrates good technique Exercise tolerated well No report of cardiac concerns or symptoms Strength training completed today  Goals Unmet:  Not Applicable  Comments: Check out 1430.   Dr. Sinda Du is Medical Director for [facility] Pulmonary Re[facility].

## 2018-01-23 NOTE — Progress Notes (Signed)
Remote pacemaker transmission.   

## 2018-01-24 DIAGNOSIS — Z192 Hormone resistant malignancy status: Secondary | ICD-10-CM | POA: Diagnosis not present

## 2018-01-24 DIAGNOSIS — C61 Malignant neoplasm of prostate: Secondary | ICD-10-CM | POA: Diagnosis not present

## 2018-01-24 DIAGNOSIS — Z9079 Acquired absence of other genital organ(s): Secondary | ICD-10-CM | POA: Diagnosis not present

## 2018-01-24 DIAGNOSIS — M818 Other osteoporosis without current pathological fracture: Secondary | ICD-10-CM | POA: Diagnosis not present

## 2018-01-28 ENCOUNTER — Encounter (HOSPITAL_COMMUNITY)
Admission: RE | Admit: 2018-01-28 | Discharge: 2018-01-28 | Disposition: A | Discharge status: Home / Self Care | Payer: PPO | Source: Ambulatory Visit | Attending: Pulmonary Disease | Admitting: Pulmonary Disease

## 2018-01-28 DIAGNOSIS — J449 Chronic obstructive pulmonary disease, unspecified: Secondary | ICD-10-CM | POA: Diagnosis not present

## 2018-01-28 NOTE — Progress Notes (Signed)
Daily Session Note  Patient Details  Name: Peter Lopez MRN: [id number] Date of Birth: 02/24/1936 Referring Provider:     PULMONARY REHAB COPD ORIENTATION from 11/20/2017 in [place]  Referring Provider  DR. Luan Pulling      Encounter Date: 01/28/2018  Check In:  Session Check In - 01/28/18 1406      Check-In   Location  [facility]    Staff Present  Diane Angelina Pih, MS, EP, [facility], Exercise Physiologist;Vika Buske Luther Parody, BS, EP, Exercise Physiologist    Supervising physician immediately available to respond to emergencies  See telemetry face sheet for immediately available MD    Medication changes reported      No    Fall or balance concerns reported     Yes    Comments  Fallen 1 ime in 12 months. He also gets weak in his knees at times    Warm-up and Cool-down  Performed as group-led instruction    Resistance Training Performed  Yes    VAD Patient?  No      Pain Assessment   Currently in Pain?  No/denies    Pain Score  0-No pain    Multiple Pain Sites  No       Capillary Blood Glucose: No results found for this or any previous visit (from the past 24 hour(s)).    Social History   Tobacco Use  Smoking Status Former Smoker  . Packs/day: 1.00  . Last attempt to quit: 12/11/1998  . Years since quitting: 19.1  Smokeless Tobacco Never Used    Goals Met:  Independence with exercise equipment Improved SOB with ADL's Using PLB without cueing & demonstrates good technique Exercise tolerated well No report of cardiac concerns or symptoms Strength training completed today  Goals Unmet:  Not Applicable  Comments: Check out 230. Patient was switched to nustep from treadmill due to leg fatigue. Patient then wanted to stop exercise for the day due to same problem. Patients BP and O2 were taken then he was escorted out to the car by his exercise physiologist.    Dr. Sinda Du is Medical Director for [facility] Pulmonary Rehab.

## 2018-01-30 ENCOUNTER — Encounter (HOSPITAL_COMMUNITY): Payer: PPO

## 2018-01-31 NOTE — Progress Notes (Signed)
Pulmonary Individual Treatment Plan  Patient Details  Name: Peter Lopez MRN: [id number] Date of Birth: June 28, 1936 Referring Provider:     PULMONARY REHAB COPD ORIENTATION from 11/20/2017 in [place]  Referring Provider  DR. Hawkins      Initial Encounter Date:    PULMONARY REHAB COPD ORIENTATION from 11/20/2017 in [place]  Date  11/20/17  Referring Provider  DR. Hawkins      Visit Diagnosis: Chronic obstructive pulmonary disease, unspecified COPD type ([place])  Patient's Home Medications on Admission:   Current Outpatient Medications:  .  acetaminophen (TYLENOL) 500 MG tablet, Take 1,000 mg by mouth every 6 (six) hours as needed for moderate pain., Disp: , Rfl:  .  amLODipine-olmesartan (AZOR) 5-40 MG per tablet, Take 1 tablet by mouth daily., Disp: , Rfl:  .  aspirin EC 81 MG tablet, Take 81 mg by mouth daily., Disp: , Rfl:  .  Calcium Citrate-Vitamin D (CALCIUM + D PO), Take 1,200 mg by mouth daily., Disp: , Rfl:  .  enzalutamide (XTANDI) 40 MG capsule, Take 160 mg by mouth daily., Disp: , Rfl:  .  folic acid (FOLVITE) 1 MG tablet, Take 1 mg by mouth daily., Disp: , Rfl:  .  Leuprolide Acetate, 6 Month, (LUPRON DEPOT, 53-MONTH,) 45 MG injection, Inject 45 mg into the muscle every 6 (six) months., Disp: , Rfl:  .  methotrexate (RHEUMATREX) 2.5 MG tablet, Take 2.5 mg by mouth once a week. sunday, Disp: , Rfl:  .  potassium chloride SA (K-DUR,KLOR-CON) 20 MEQ tablet, take 2 tablets by mouth once daily, Disp: 60 tablet, Rfl: 6 .  propranolol (INDERAL) 40 MG tablet, Take 40 mg by mouth daily. , Disp: , Rfl:  .  simvastatin (ZOCOR) 40 MG tablet, Take 40 mg by mouth daily.  , Disp: , Rfl:  .  torsemide (DEMADEX) 20 MG tablet, Take 20 mg by mouth daily. , Disp: , Rfl:   Past Medical History: Past Medical History:  Diagnosis Date  . 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 Benign essential tremor   . Exertional dyspnea   . Hyperlipidemia    . Hypertension   . Pacemaker 11/24/2012   Dr Lovena Le  . Prostate cancer ([place])    S/P Prostatectomy  . Tobacco abuse    30 pack years; discontinued in 2000; currently one pack per week    Tobacco Use: Social History   Tobacco Use  Smoking Status Former Smoker  . Packs/day: 1.00  . Last attempt to quit: 12/11/1998  . Years since quitting: 19.1  Smokeless Tobacco Never Used    Labs: Recent Chemical engineer    Labs for ITP Cardiac and Pulmonary Rehab Latest Ref Rng & Units 05/03/2010 07/22/2010 12/07/2011 03/01/2016   Cholestrol 0 - 200 mg/dL - 159 163 -   LDLCALC 0 - 99 mg/dL - 89 86 -   HDL >39 mg/dL - 58 57 -   Trlycerides <150 mg/dL - 60 100 -   PHART 7.350 - 7.450 7.428 - - -   PCO2ART 35.0 - 45.0 mmHg 36.2 - - -   HCO3 20.0 - 24.0 mEq/L 23.5 - - 27.9(H)   TCO2 0 - 100 mmol/L 20.2 - - 29   O2SAT % 96.4 - - 62.0      Capillary Blood Glucose: No results found for: GLUCAP   Pulmonary Assessment Scores: Pulmonary Assessment Scores    Row Name 11/20/17 1122  ADL UCSD   ADL Phase  Entry     SOB Score total  31     Rest  0     Walk  7     Stairs  3     Bath  1     Dress  1     Shop  0       CAT Score   CAT Score  14       mMRC Score   mMRC Score  2        Pulmonary Function Assessment: Pulmonary Function Assessment - 11/20/17 1021      Pulmonary Function Tests   FVC%  3.86 %    FEV1%  2.74 %    FEV1/FVC Ratio  72    RV%  2.6 %    DLCO%  30.99 %      Initial Spirometry Results   FVC%  3.86 %    FEV1%  2.74 %    FEV1/FVC Ratio  72      Post Bronchodilator Spirometry Results   FVC%  94 %    FEV1%  70 %    FEV1/FVC Ratio  73    Comments  Actual FEV/FVC = 53      Breath   Bilateral Breath Sounds  Clear    Shortness of Breath  -- with exertion       Exercise Target Goals:    Exercise Program Goal: Individual exercise prescription set using results from initial 6 min walk test and THRR while considering  patient's activity  barriers and safety.   Exercise Prescription Goal: Initial exercise prescription builds to 30-45 minutes a day of aerobic activity, 2-3 days per week.  Home exercise guidelines will be given to patient during program as part of exercise prescription that the participant will acknowledge.  Activity Barriers & Risk Stratification: Activity Barriers & Cardiac Risk Stratification - 11/20/17 1012      Activity Barriers & Cardiac Risk Stratification   Activity Barriers  Other (comment);Joint Problems;Balance Concerns    Comments  Tremors    Cardiac Risk Stratification  High       6 Minute Walk: 6 Minute Walk    Row Name 11/20/17 1011         6 Minute Walk   Phase  Initial     Distance  750 feet     Distance % Change  0 %     Distance Feet Change  0 ft     Walk Time  6 minutes     # of Rest Breaks  1     MPH  1.42     METS  2.08     RPE  12     Perceived Dyspnea   13     VO2 Peak  7.56     Symptoms  No     Resting HR  70 bpm     Resting BP  126/70     Resting Oxygen Saturation   97 %     Exercise Oxygen Saturation  during 6 min walk  89 %     Max Ex. HR  104 bpm     Max Ex. BP  160/74     2 Minute Post BP  130/70        Oxygen Initial Assessment: Oxygen Initial Assessment - 11/20/17 1130      Home Oxygen   Home Oxygen Device  None    Sleep Oxygen Prescription  None  Home Exercise Oxygen Prescription  None    Home at Rest Exercise Oxygen Prescription  None      Program Oxygen Prescription   Program Oxygen Prescription  None       Oxygen Re-Evaluation: Oxygen Re-Evaluation    [place] Name 12/19/17 1443 01/09/18 1538 01/31/18 1419         Program Oxygen Prescription   Program Oxygen Prescription  None  None  None       Home Oxygen   Home Oxygen Device  None  None  None     Sleep Oxygen Prescription  None  None  None     Home Exercise Oxygen Prescription  None  None  None     Home at Rest Exercise Oxygen Prescription  None  None  None       Goals/Expected  Outcomes   Short Term Goals  To learn and demonstrate proper pursed lip breathing techniques or other breathing techniques.;To learn and understand importance of maintaining oxygen saturations>88%;To learn and understand importance of monitoring SPO2 with pulse oximeter and demonstrate accurate use of the pulse oximeter.  To learn and demonstrate proper pursed lip breathing techniques or other breathing techniques.;To learn and understand importance of maintaining oxygen saturations>88%;To learn and understand importance of monitoring SPO2 with pulse oximeter and demonstrate accurate use of the pulse oximeter.  To learn and demonstrate proper pursed lip breathing techniques or other breathing techniques.;To learn and understand importance of maintaining oxygen saturations>88%;To learn and understand importance of monitoring SPO2 with pulse oximeter and demonstrate accurate use of the pulse oximeter.     Long  Term Goals  Exhibits proper breathing techniques, such as pursed lip breathing or other method taught during program session;Maintenance of O2 saturations>88%;Verbalizes importance of monitoring SPO2 with pulse oximeter and return demonstration  -  Exhibits proper breathing techniques, such as pursed lip breathing or other method taught during program session;Maintenance of O2 saturations>88%;Verbalizes importance of monitoring SPO2 with pulse oximeter and return demonstration     Comments  Patient is able to demonstrate proper usage of pulse oximeter and pursed lip breathing during exercise. He also is able to verbalize the importance of maintaining his O2 sat level >88%.  Patient is able to demonstrate proper usage of pulse oximeter and pursed lip breathing during exercise. He also is able to verbalize the importance of maintaining his O2 sat level >88%.  Patient is able to demonstrate proper usage of pulse oximeter and pursed lip breathing during exercise. He also is able to verbalize the importance of  maintaining his O2 sat level >88%.     Goals/Expected Outcomes  Patient will continue to meet his short and long term goals.   Patient will continue to meet his short and long term goals.   Patient will continue to meet his short and long term goals.         Oxygen Discharge (Final Oxygen Re-Evaluation): Oxygen Re-Evaluation - 01/31/18 1419      Program Oxygen Prescription   Program Oxygen Prescription  None      Home Oxygen   Home Oxygen Device  None    Sleep Oxygen Prescription  None    Home Exercise Oxygen Prescription  None    Home at Rest Exercise Oxygen Prescription  None      Goals/Expected Outcomes   Short Term Goals  To learn and demonstrate proper pursed lip breathing techniques or other breathing techniques.;To learn and understand importance of maintaining oxygen saturations>88%;To learn and understand importance  of monitoring SPO2 with pulse oximeter and demonstrate accurate use of the pulse oximeter.    Long  Term Goals  Exhibits proper breathing techniques, such as pursed lip breathing or other method taught during program session;Maintenance of O2 saturations>88%;Verbalizes importance of monitoring SPO2 with pulse oximeter and return demonstration    Comments  Patient is able to demonstrate proper usage of pulse oximeter and pursed lip breathing during exercise. He also is able to verbalize the importance of maintaining his O2 sat level >88%.    Goals/Expected Outcomes  Patient will continue to meet his short and long term goals.        Initial Exercise Prescription: Initial Exercise Prescription - 11/20/17 1000      Date of Initial Exercise RX and Referring Provider   Date  11/20/17    Referring Provider  DR. Hawkins      Treadmill   MPH  1    Grade  0    Minutes  15    METs  1.7      NuStep   Level  1    SPM  46    Minutes  20    METs  1.8      Prescription Details   Frequency (times per week)  3    Duration  Progress to 30 minutes of continuous  aerobic without signs/symptoms of physical distress      Intensity   THRR 40-80% of Max Heartrate  [phone]    Ratings of Perceived Exertion  11-13    Perceived Dyspnea  0-4      Progression   Progression  Continue progressive overload as per policy without signs/symptoms or physical distress.      Resistance Training   Training Prescription  Yes    Weight  1    Reps  10-15       Perform Capillary Blood Glucose checks as needed.  Exercise Prescription Changes:  Exercise Prescription Changes    Row Name 12/12/17 0800 12/24/17 1000 01/06/18 1500 01/20/18 1400       Response to Exercise   Blood Pressure (Admit)  140/70  136/62  150/70  150/72    Blood Pressure (Exercise)  150/74  146/74  160/80  158/76    Blood Pressure (Exit)  140/70  140/66  142/70  150/70    Heart Rate (Admit)  66 bpm  60 bpm  71 bpm  78 bpm    Heart Rate (Exercise)  91 bpm  79 bpm  99 bpm  105 bpm    Heart Rate (Exit)  75 bpm  75 bpm  78 bpm  80 bpm    Oxygen Saturation (Admit)  95 %  96 %  96 %  97 %    Oxygen Saturation (Exercise)  94 %  90 %  93 %  93 %    Oxygen Saturation (Exit)  94 %  97 %  93 %  95 %    Rating of Perceived Exertion (Exercise)  '13  13  10  12    '$ Perceived Dyspnea (Exercise)  '14  13  11  12    '$ Duration  Progress to 30 minutes of  aerobic without signs/symptoms of physical distress  Progress to 30 minutes of  aerobic without signs/symptoms of physical distress  Progress to 30 minutes of  aerobic without signs/symptoms of physical distress  Progress to 30 minutes of  aerobic without signs/symptoms of physical distress    Intensity  THRR New [phone]  THRR New [phone]  THRR New [phone]  THRR New [phone]      Progression   Progression  Continue to progress workloads to maintain intensity without signs/symptoms of physical distress.  Continue to progress workloads to maintain intensity without signs/symptoms of physical distress.  Continue to progress workloads to maintain  intensity without signs/symptoms of physical distress.  Continue to progress workloads to maintain intensity without signs/symptoms of physical distress.      Resistance Training   Training Prescription  Yes  Yes  Yes  Yes    Weight  '3  2  2  3    '$ Reps  10-15  10-15  10-15  10-15      Treadmill   MPH  1  1.2  1.2  -    Grade  0  0  0  -    Minutes  '15  15  15  '$ -    METs  1.7  1.9  1.9  -      NuStep   Level  '1  1  1  1    '$ SPM  87  72  84  88    Minutes  '20  20  20  20    '$ METs  1.7  1.8  1.9  2.2      Arm Ergometer   Level  -  -  -  1.3    Watts  -  -  -  12    Minutes  -  -  -  15    METs  -  -  -  1.9      Home Exercise Plan   Plans to continue exercise at  Home (comment)  Home (comment)  Home (comment)  Home (comment)    Frequency  Add 3 additional days to program exercise sessions.  Add 3 additional days to program exercise sessions.  Add 3 additional days to program exercise sessions.  Add 3 additional days to program exercise sessions.    Initial Home Exercises Provided  12/05/17  12/05/17  12/05/17  12/05/17       Exercise Comments:  Exercise Comments    Row Name 12/12/17 0809 12/24/17 1021 01/06/18 1513 01/20/18 1443     Exercise Comments  Patient is doing well in PR and has increased his SPMs on the nustep machine while maintaining his speed on the treadmill. Patient O2 levels are looking good while he is being active not often dropping below 90. We ar eworking on increasing his stability levels by helping him keep his balance on the treadmill by holding on to the support bars. We will continue to monitor the patient throughout the remainder of the program.   Patient continues to do well in PR. Patient has increased his speed on the treadmill to 1.2. Patient has also been maintaining his level and SPMs on the machine. Patient seems to be enjoying the program and has stated that he is feeling some energy coming back to him since he is only on 8 sessions of the program.    Patient continues to do well in PR. Patient has maintained his speed on the treadmill and has also maintained his level on the nustep machine. patient has increased his SPMs and METS on the nustep. Patient stated that He has better mobility since beginning the program and can breathe better now.   Patient is doing well in PR. Patient has been moved off of the treadmill due to uneasiness. Patient was  moved to the arm ergometer and has been doing well on level 1.3. Patient has also increased his SPMs on the nustep machine and has increased his weight for the dumbbells to 3lbs each session.        Exercise Goals and Review:  Exercise Goals    Row Name 11/20/17 1015             Exercise Goals   Increase Physical Activity  Yes       Intervention  Provide advice, education, support and counseling about physical activity/exercise needs.;Develop an individualized exercise prescription for aerobic and resistive training based on initial evaluation findings, risk stratification, comorbidities and participant's personal goals.       Expected Outcomes  Short Term: Attend rehab on a regular basis to increase amount of physical activity.       Increase Strength and Stamina  Yes       Intervention  Provide advice, education, support and counseling about physical activity/exercise needs.;Develop an individualized exercise prescription for aerobic and resistive training based on initial evaluation findings, risk stratification, comorbidities and participant's personal goals.       Expected Outcomes  Short Term: Increase workloads from initial exercise prescription for resistance, speed, and METs.;Long Term: Improve cardiorespiratory fitness, muscular endurance and strength as measured by increased METs and functional capacity (6MWT);Short Term: Perform resistance training exercises routinely during rehab and add in resistance training at home       Able to understand and use rate of perceived exertion (RPE) scale   Yes       Intervention  Provide education and explanation on how to use RPE scale       Expected Outcomes  Short Term: Able to use RPE daily in rehab to express subjective intensity level;Long Term:  Able to use RPE to guide intensity level when exercising independently       Able to understand and use Dyspnea scale  Yes       Intervention  Provide education and explanation on how to use Dyspnea scale       Expected Outcomes  Short Term: Able to use Dyspnea scale daily in rehab to express subjective sense of shortness of breath during exertion;Long Term: Able to use Dyspnea scale to guide intensity level when exercising independently       Knowledge and understanding of Target Heart Rate Range (THRR)  Yes       Intervention  Provide education and explanation of THRR including how the numbers were predicted and where they are located for reference       Expected Outcomes  Short Term: Able to state/look up THRR;Long Term: Able to use THRR to govern intensity when exercising independently;Short Term: Able to use daily as guideline for intensity in rehab       Able to check pulse independently  Yes       Intervention  Provide education and demonstration on how to check pulse in carotid and radial arteries.;Review the importance of being able to check your own pulse for safety during independent exercise       Expected Outcomes  Short Term: Able to explain why pulse checking is important during independent exercise;Long Term: Able to check pulse independently and accurately       Understanding of Exercise Prescription  Yes       Intervention  Provide education, explanation, and written materials on patient's individual exercise prescription       Expected Outcomes  Short Term: Able to explain  program exercise prescription;Long Term: Able to explain home exercise prescription to exercise independently          Exercise Goals Re-Evaluation : Exercise Goals Re-Evaluation    Row Name 12/16/17 1508  01/06/18 1514 01/28/18 0821         Exercise Goal Re-Evaluation   Exercise Goals Review  Increase Physical Activity;Increase Strength and Stamina;Able to understand and use rate of perceived exertion (RPE) scale;Able to check pulse independently;Knowledge and understanding of Target Heart Rate Range (THRR);Able to understand and use Dyspnea scale;Understanding of Exercise Prescription  Increase Physical Activity;Increase Strength and Stamina;Able to understand and use rate of perceived exertion (RPE) scale;Able to check pulse independently;Knowledge and understanding of Target Heart Rate Range (THRR);Able to understand and use Dyspnea scale;Understanding of Exercise Prescription  Increase Physical Activity;Increase Strength and Stamina;Able to understand and use rate of perceived exertion (RPE) scale;Able to check pulse independently;Knowledge and understanding of Target Heart Rate Range (THRR);Able to understand and use Dyspnea scale;Understanding of Exercise Prescription     Comments  Patient has been doing well since beginning the program. Patient is only on sixth session but has shown great potential on the nustep machine gaining an average SPMs of 87 so far. Patient has also maintained his speed on the treadmill and has kept his O2 level above 90 while on it. Patients goals are being worked on becoming met. Patient will be continued to be monitored throughout the remainder of the program.   Patient continues to do well in PR. Patient has maintained his speed on the treadmill and has also maintained his level on the nustep machine. patient has increased his SPMs and METS on the nustep. Patient stated that He has better mobility since beginning the program and can breathe better now.   Patient continues to do well in CR. patient has stated to me that he still feels uneasy with his legs which is hindering much progression on the treadmill however he does feel more endurance since beginning the program which  is helping him throughout the day.      Expected Outcomes  Patient wishes to increase his lung capacity and to strength legs as well as gain stability.   Patient wishes to increase his lung capacity and to strength legs as well as gain stability.   Patient wishes to increase his lung capacity and to strength legs as well as gain stability.         Discharge Exercise Prescription (Final Exercise Prescription Changes): Exercise Prescription Changes - 01/20/18 1400      Response to Exercise   Blood Pressure (Admit)  150/72    Blood Pressure (Exercise)  158/76    Blood Pressure (Exit)  150/70    Heart Rate (Admit)  78 bpm    Heart Rate (Exercise)  105 bpm    Heart Rate (Exit)  80 bpm    Oxygen Saturation (Admit)  97 %    Oxygen Saturation (Exercise)  93 %    Oxygen Saturation (Exit)  95 %    Rating of Perceived Exertion (Exercise)  12    Perceived Dyspnea (Exercise)  12    Duration  Progress to 30 minutes of  aerobic without signs/symptoms of physical distress    Intensity  THRR New [phone]      Progression   Progression  Continue to progress workloads to maintain intensity without signs/symptoms of physical distress.      Resistance Training   Training Prescription  Yes    Weight  3    Reps  10-15      Treadmill   MPH  --    Grade  --    Minutes  --    METs  --      NuStep   Level  1    SPM  88    Minutes  20    METs  2.2      Arm Ergometer   Level  1.3    Watts  12    Minutes  15    METs  1.9      Home Exercise Plan   Plans to continue exercise at  Home (comment)    Frequency  Add 3 additional days to program exercise sessions.    Initial Home Exercises Provided  12/05/17       Nutrition:  Target Goals: Understanding of nutrition guidelines, daily intake of sodium '1500mg'$ , cholesterol '200mg'$ , calories 30% from fat and 7% or less from saturated fats, daily to have 5 or more servings of fruits and vegetables.  Biometrics: Pre Biometrics - 11/20/17 1015       Pre Biometrics   Height  '5\' 9"'$  (1.753 m)    Weight  134 lb 14.4 oz (61.2 kg)    Waist Circumference  32 inches    Hip Circumference  36 inches    Waist to Hip Ratio  0.89 %    BMI (Calculated)  19.91    Triceps Skinfold  10 mm    % Body Fat  19.7 %    Grip Strength  44.53 kg    Flexibility  0 in    Single Leg Stand  0 seconds        Nutrition Therapy Plan and Nutrition Goals: Nutrition Therapy & Goals - 11/21/17 1524      Personal Nutrition Goals   Nutrition Goal  For heart healthy choices add >50% of whole grains, make half their plate fruits and vegetables. Discuss the difference between starchy vegetables and leafy greens, and how leafy vegetables provide fiber, helps maintain healthy weight, helps control blood glucose, and lowers cholesterol.  Discuss purchasing fresh or frozen vegetable to reduce sodium and not to add grease, fat or sugar. Consume <18oz of red meat per week. Consume lean cuts of meats and very little of meats high in sodium and nitrates such as pork and lunch meats. Discussed portion control for all food groups.      Personal Goal #2  He eats very little. When is eating he eats a heart healthy diet.     Comments  Patient met with RD 11/21/17.      Intervention Plan   Intervention  Nutrition handout(s) given to patient.    Expected Outcomes  Short Term Goal: Understand basic principles of dietary content, such as calories, fat, sodium, cholesterol and nutrients.       Nutrition Assessments: Nutrition Assessments - 11/20/17 1132      MEDFICTS Scores   Pre Score  3       Nutrition Goals Re-Evaluation: Nutrition Goals Re-Evaluation    [place] Name 12/19/17 1445 01/09/18 1538 01/31/18 1419         Goals   Current Weight  136 lb (61.7 kg)  135 lb (61.2 kg)  138 lb (62.6 kg)     Nutrition Goal  For heart healthy choices add >50% of whole grains, make half their plate fruits and vegetables. Discuss the difference between starchy vegetables and leafy greens,  and how leafy vegetables  provide fiber, helps maintain healthy weight, helps control blood glucose, and lowers cholesterol.  Discuss purchasing fresh or frozen vegetable to reduce sodium and not to add grease, fat or sugar. Consume <18oz of red meat per week. Consume lean cuts of meats and very little of meats high in sodium and nitrates such as pork and lunch meats. Discussed portion control for all food groups.    For heart healthy choices add >50% of whole grains, make half their plate fruits and vegetables. Discuss the difference between starchy vegetables and leafy greens, and how leafy vegetables provide fiber, helps maintain healthy weight, helps control blood glucose, and lowers cholesterol.  Discuss purchasing fresh or frozen vegetable to reduce sodium and not to add grease, fat or sugar. Consume <18oz of red meat per week. Consume lean cuts of meats and very little of meats high in sodium and nitrates such as pork and lunch meats. Discussed portion control for all food groups.    For heart healthy choices add >50% of whole grains, make half their plate fruits and vegetables. Discuss the difference between starchy vegetables and leafy greens, and how leafy vegetables provide fiber, helps maintain healthy weight, helps control blood glucose, and lowers cholesterol.  Discuss purchasing fresh or frozen vegetable to reduce sodium and not to add grease, fat or sugar. Consume <18oz of red meat per week. Consume lean cuts of meats and very little of meats high in sodium and nitrates such as pork and lunch meats. Discussed portion control for all food groups.       Comment  Patient has gained 2 lbs since his initial visit. He says he eat a healthy diet. Will continue to monitor.   Patient has lost 1 lb since last 30 day review. He continues to say he is trying to eat a healthy diet. Will continue to monitor for progress.   Patient has lost 3 lbs since last 30 day review. He continues to say he is trying to eat a  healthy diet. Will continue to monitor for progress.      Expected Outcome  -  Patient will continue to meet his nutritional goals.   Patient will continue to meet his nutritional goals.        Personal Goal #2 Re-Evaluation   Personal Goal #2  He eats very little. When is eating he eats a heart healthy diet.   He eats very little. When is eating he eats a heart healthy diet.   -        Nutrition Goals Discharge (Final Nutrition Goals Re-Evaluation): Nutrition Goals Re-Evaluation - 01/31/18 1419      Goals   Current Weight  138 lb (62.6 kg)    Nutrition Goal  For heart healthy choices add >50% of whole grains, make half their plate fruits and vegetables. Discuss the difference between starchy vegetables and leafy greens, and how leafy vegetables provide fiber, helps maintain healthy weight, helps control blood glucose, and lowers cholesterol.  Discuss purchasing fresh or frozen vegetable to reduce sodium and not to add grease, fat or sugar. Consume <18oz of red meat per week. Consume lean cuts of meats and very little of meats high in sodium and nitrates such as pork and lunch meats. Discussed portion control for all food groups.      Comment  Patient has lost 3 lbs since last 30 day review. He continues to say he is trying to eat a healthy diet. Will continue to monitor for progress.  Expected Outcome  Patient will continue to meet his nutritional goals.        Psychosocial: Target Goals: Acknowledge presence or absence of significant depression and/or stress, maximize coping skills, provide positive support system. Participant is able to verbalize types and ability to use techniques and skills needed for reducing stress and depression.  Initial Review & Psychosocial Screening: Initial Psych Review & Screening - 11/20/17 1134      Initial Review   Current issues with  None Identified      Family Dynamics   Good Support System?  Yes      Barriers   Psychosocial barriers to  participate in program  There are no identifiable barriers or psychosocial needs.      Screening Interventions   Interventions  Encouraged to exercise    Expected Outcomes  Short Term goal: Identification and review with participant of any Quality of Life or Depression concerns found by scoring the questionnaire.;Long Term goal: The participant improves quality of Life and PHQ9 Scores as seen by post scores and/or verbalization of changes       Quality of Life Scores: Quality of Life - 11/20/17 1016      Quality of Life Scores   Health/Function Pre  14.23 %    Socioeconomic Pre  24.75 %    Psych/Spiritual Pre  21.36 %    Family Pre  22.5 %    GLOBAL Pre  18.83 %      Scores of 19 and below usually indicate a poorer quality of life in these areas.  A difference of  2-3 points is a clinically meaningful difference.  A difference of 2-3 points in the total score of the Quality of Life Index has been associated with significant improvement in overall quality of life, self-image, physical symptoms, and general health in studies assessing change in quality of life.   PHQ-9: Recent Review Flowsheet Data    Depression screen [facility] 2/9 11/20/2017   Decreased Interest 1   Down, Depressed, Hopeless 0   PHQ - 2 Score 1   Altered sleeping 0   Tired, decreased energy 2   Change in appetite 0   Feeling bad or failure about yourself  0   Trouble concentrating 0   Moving slowly or fidgety/restless 0   Suicidal thoughts 0   PHQ-9 Score 3   Difficult doing work/chores Not difficult at all     Interpretation of Total Score  Total Score Depression Severity:  1-4 = Minimal depression, 5-9 = Mild depression, 10-14 = Moderate depression, 15-19 = Moderately severe depression, 20-27 = Severe depression   Psychosocial Evaluation and Intervention: Psychosocial Evaluation - 11/20/17 1145      Psychosocial Evaluation & Interventions   Interventions  Encouraged to exercise with the program and follow  exercise prescription    Continue Psychosocial Services   Follow up required by staff       Psychosocial Re-Evaluation: Psychosocial Re-Evaluation    [place] Name 12/19/17 1449 01/09/18 1543 01/31/18 1426         Psychosocial Re-Evaluation   Current issues with  None Identified  None Identified  None Identified     Comments  Patient's initial QOL score was 18.83 and his PHQ-9 score was 3 with no psychosocial issues identified.   Patient's initial QOL score was 18.83 and his PHQ-9 score was 3 with no psychosocial issues identified.   Patient's initial QOL score was 18.83 and his PHQ-9 score was 3 with no psychosocial issues  identified.      Expected Outcomes  Patient will have no psychosocial issues identified at discharge.   Patient will have no psychosocial issues identified at discharge.   Patient will have no psychosocial issues identified at discharge.      Interventions  Relaxation education;Stress management education;Encouraged to attend Pulmonary Rehabilitation for the exercise  Relaxation education;Stress management education;Encouraged to attend Pulmonary Rehabilitation for the exercise  Relaxation education;Stress management education;Encouraged to attend Pulmonary Rehabilitation for the exercise     Continue Psychosocial Services   No Follow up required  No Follow up required  No Follow up required        Psychosocial Discharge (Final Psychosocial Re-Evaluation): Psychosocial Re-Evaluation - 01/31/18 1426      Psychosocial Re-Evaluation   Current issues with  None Identified    Comments  Patient's initial QOL score was 18.83 and his PHQ-9 score was 3 with no psychosocial issues identified.     Expected Outcomes  Patient will have no psychosocial issues identified at discharge.     Interventions  Relaxation education;Stress management education;Encouraged to attend Pulmonary Rehabilitation for the exercise    Continue Psychosocial Services   No Follow up required         Education: Education Goals: Education classes will be provided on a weekly basis, covering required topics. Participant will state understanding/return demonstration of topics presented.  Learning Barriers/Preferences: Learning Barriers/Preferences - 11/20/17 9892      Learning Barriers/Preferences   Learning Barriers  None    Learning Preferences  Skilled Demonstration;Individual Instruction;Group Instruction       Education Topics: How Lungs Work and Diseases: - Discuss the anatomy of the lungs and diseases that can affect the lungs, such as COPD.   Exercise: -Discuss the importance of exercise, FITT principles of exercise, normal and abnormal responses to exercise, and how to exercise safely.   PULMONARY REHAB OTHER RESPIRATORY from 01/23/2018 in [place]  Date  12/19/17  Educator  [place]  Instruction Review Code  2- Demonstrated Understanding      Environmental Irritants: -Discuss types of environmental irritants and how to limit exposure to environmental irritants.   Meds/Inhalers and oxygen: - Discuss respiratory medications, definition of an inhaler and oxygen, and the proper way to use an inhaler and oxygen.   PULMONARY REHAB OTHER RESPIRATORY from 01/23/2018 in [place]  Date  01/09/18  Educator  [place]      Energy Saving Techniques: - Discuss methods to conserve energy and decrease shortness of breath when performing activities of daily living.    PULMONARY REHAB OTHER RESPIRATORY from 01/23/2018 in [place]  Date  01/16/18  Educator  [place]  Instruction Review Code  2- Demonstrated Understanding      Bronchial Hygiene / Breathing Techniques: - Discuss breathing mechanics, pursed-lip breathing technique,  proper posture, effective ways to clear airways, and other functional breathing techniques   PULMONARY REHAB OTHER RESPIRATORY from 01/23/2018 in [place]  Date   01/23/18  Educator  DJ  Instruction Review Code  2- Demonstrated Understanding      Cleaning Equipment: - Provides group verbal and written instruction about the health risks of elevated stress, cause of high stress, and healthy ways to reduce stress.   Nutrition I: Fats: - Discuss the types of cholesterol, what cholesterol does to the body, and how cholesterol levels can be controlled.   Nutrition II: Labels: -Discuss the different components of food labels and how to read  food labels.   Respiratory Infections: - Discuss the signs and symptoms of respiratory infections, ways to prevent respiratory infections, and the importance of seeking medical treatment when having a respiratory infection.   Stress I: Signs and Symptoms: - Discuss the causes of stress, how stress may lead to anxiety and depression, and ways to limit stress.   PULMONARY REHAB OTHER RESPIRATORY from 01/23/2018 in [place]  Date  11/28/17  Educator  [place]  Instruction Review Code  2- Demonstrated Understanding      Stress II: Relaxation: -Discuss relaxation techniques to limit stress.   PULMONARY REHAB OTHER RESPIRATORY from 01/23/2018 in [place]  Date  12/05/17  Educator  [place]  Instruction Review Code  2- Demonstrated Understanding      Oxygen for Home/Travel: - Discuss how to prepare for travel when on oxygen and proper ways to transport and store oxygen to ensure safety.   Knowledge Questionnaire Score: Knowledge Questionnaire Score - 11/20/17 1019      Knowledge Questionnaire Score   Pre Score  11/18       Core Components/Risk Factors/Patient Goals at Admission: Personal Goals and Risk Factors at Admission - 11/20/17 1132      Core Components/Risk Factors/Patient Goals on Admission    Weight Management  Weight Maintenance    Personal Goal Other  Yes    Personal Goal  Increase lung capacity, Strengthen legs, gain stability.     Intervention   Attend PR 2 x week and supplement at home exercise 3 x week.     Expected Outcomes  Reach goals.        Core Components/Risk Factors/Patient Goals Review:  Goals and Risk Factor Review    Row Name 12/19/17 1446 01/09/18 1539 01/31/18 1420         Core Components/Risk Factors/Patient Goals Review   Personal Goals Review  Weight Management/Obesity Increase lung capacity; stengthen legs; gain stability.   Weight Management/Obesity Increase lung capacity; strengthen legs; gain stability.   Weight Management/Obesity Increase lung capacity; strengthen legs; gain stability.      Review  Patient has completed 8 sessions gaining 2 lbs which is needed since his BMI is 19.2. He says he was only able to take 10 to 12 steps without having to stop and rest before he started our program. He is now able to walk further and he is pleased with his progress so far. He hopes to gain more strength in his legs as he continues. Will continue to monitor for progress.   Patient has completed 12 sessions maintaining his weight since last 30 day review. He continues to say the program has helped him mainly walk better and he is able to walk longer distance. He can not tell a difference with his breathing yet but he feels that the program is helping him. Will continue to monitor for progress.    Patient has completed 17 sessions losing 3 lbs since last 30 day review. He continues to do well in the program with progression. He received his 6 month Lupron-Depro injection 01/24/18 which makes him have lower extremity weakness. He was not able to complete his session 6/11 and was not able to attend 6/13. He does feel the program is helping him, but treatment for his cancer impeded his progress. Will continue to monitor.      Expected Outcomes  Patient will continue to attend sessions and complete the program meeting his personal goals.   Patient will continue to attend  sessions and complete the program meeting his personal goals.    Patient will continue to attend sessions and complete the program meeting his personal goals.         Core Components/Risk Factors/Patient Goals at Discharge (Final Review):  Goals and Risk Factor Review - 01/31/18 1420      Core Components/Risk Factors/Patient Goals Review   Personal Goals Review  Weight Management/Obesity Increase lung capacity; strengthen legs; gain stability.     Review  Patient has completed 17 sessions losing 3 lbs since last 30 day review. He continues to do well in the program with progression. He received his 6 month Lupron-Depro injection 01/24/18 which makes him have lower extremity weakness. He was not able to complete his session 6/11 and was not able to attend 6/13. He does feel the program is helping him, but treatment for his cancer impeded his progress. Will continue to monitor.     Expected Outcomes  Patient will continue to attend sessions and complete the program meeting his personal goals.        ITP Comments: ITP Comments    Row Name 11/20/17 1129 11/21/17 1250         ITP Comments  Mr. Huesman is a pleasant 82 year old gentleman who is getting ready to start. He will have several other doctors appointments due to having lung cancer and prostate cancer. He is eager to get started.   Patient new to program. Plans to start Tuesday 11/26/17. Will continue to monitor for progress.          Comments: ITP 30 REVIEW Pt is making expected progress toward pulmonary rehab goals after completing 17 sessions. Recommend continued exercise, life style modification, education, and utilization of breathing techniques to increase stamina and strength and decrease shortness of breath with exertion.

## 2018-02-04 ENCOUNTER — Encounter (HOSPITAL_COMMUNITY): Payer: PPO

## 2018-02-04 DIAGNOSIS — J90 Pleural effusion, not elsewhere classified: Secondary | ICD-10-CM | POA: Diagnosis not present

## 2018-02-04 DIAGNOSIS — C61 Malignant neoplasm of prostate: Secondary | ICD-10-CM | POA: Diagnosis not present

## 2018-02-04 DIAGNOSIS — Z192 Hormone resistant malignancy status: Secondary | ICD-10-CM | POA: Diagnosis not present

## 2018-02-06 ENCOUNTER — Encounter (HOSPITAL_COMMUNITY)
Admission: RE | Admit: 2018-02-06 | Discharge: 2018-02-06 | Disposition: A | Discharge status: Home / Self Care | Payer: PPO | Source: Ambulatory Visit | Attending: Pulmonary Disease | Admitting: Pulmonary Disease

## 2018-02-06 DIAGNOSIS — J449 Chronic obstructive pulmonary disease, unspecified: Secondary | ICD-10-CM

## 2018-02-06 NOTE — Progress Notes (Signed)
Daily Session Note  Patient Details  Name: Peter Lopez MRN: [id number] Date of Birth: 1936-08-10 Referring Provider:     PULMONARY REHAB COPD ORIENTATION from 11/20/2017 in [place]  Referring Provider  DR. Luan Pulling      Encounter Date: 02/06/2018  Check In: Session Check In - 02/06/18 1330      Check-In   Location  [facility]    Staff Present  Diane Angelina Pih, MS, EP, [facility], Exercise Physiologist;Shruthi Northrup Luther Parody, BS, EP, Exercise Physiologist;Debra Wynetta Emery, RN, BSN    Supervising physician immediately available to respond to emergencies  See telemetry face sheet for immediately available MD    Medication changes reported      No    Fall or balance concerns reported     Yes    Comments  Fallen 1 ime in 12 months. He also gets weak in his knees at times    Warm-up and Cool-down  Performed as group-led instruction    Resistance Training Performed  Yes    VAD Patient?  No      Pain Assessment   Currently in Pain?  No/denies    Pain Score  0-No pain    Multiple Pain Sites  No       Capillary Blood Glucose: No results found for this or any previous visit (from the past 24 hour(s)).  Exercise Prescription Changes - 02/05/18 1400      Response to Exercise   Blood Pressure (Admit)  142/72    Blood Pressure (Exercise)  160/80    Blood Pressure (Exit)  160/90    Heart Rate (Admit)  57 bpm    Heart Rate (Exercise)  64 bpm    Heart Rate (Exit)  85 bpm    Oxygen Saturation (Admit)  99 %    Oxygen Saturation (Exercise)  91 %    Oxygen Saturation (Exit)  97 %    Rating of Perceived Exertion (Exercise)  14    Perceived Dyspnea (Exercise)  12    Duration  Progress to 30 minutes of  aerobic without signs/symptoms of physical distress    Intensity  THRR New [id number]      Progression   Progression  Continue to progress workloads to maintain intensity without signs/symptoms of physical distress.      Resistance Training   Training Prescription   Yes    Weight  4    Reps  10-15      Treadmill   MPH  1.2    Grade  0    Minutes  15    METs  1.9      NuStep   Level  1    SPM  82    Minutes  20    METs  1.9      Home Exercise Plan   Plans to continue exercise at  Home (comment)    Frequency  Add 3 additional days to program exercise sessions.    Initial Home Exercises Provided  12/05/17       Social History   Tobacco Use  Smoking Status Former Smoker  . Packs/day: 1.00  . Last attempt to quit: 12/11/1998  . Years since quitting: 19.1  Smokeless Tobacco Never Used    Goals Met:  Independence with exercise equipment Improved SOB with ADL's Using PLB without cueing & demonstrates good technique Exercise tolerated well No report of cardiac concerns or symptoms Strength training completed today  Goals Unmet:  Not Applicable  Comments:  Check out 230   Dr. Sinda Du is Medical Director for [facility] Pulmonary Rehab.

## 2018-02-11 ENCOUNTER — Encounter (HOSPITAL_COMMUNITY)
Admission: RE | Admit: 2018-02-11 | Discharge: 2018-02-11 | Disposition: A | Discharge status: Home / Self Care | Payer: PPO | Source: Ambulatory Visit | Attending: Pulmonary Disease | Admitting: Pulmonary Disease

## 2018-02-11 DIAGNOSIS — J449 Chronic obstructive pulmonary disease, unspecified: Secondary | ICD-10-CM

## 2018-02-11 NOTE — Progress Notes (Signed)
Daily Session Note  Patient Details  Name: Peter Lopez MRN: [id number] Date of Birth: 1935/12/26 Referring Provider:     PULMONARY REHAB COPD ORIENTATION from 11/20/2017 in [place]  Referring Provider  DR. Luan Pulling      Encounter Date: 02/11/2018  Check In: Session Check In - 02/11/18 1330      Check-In   Location  [facility]    Staff Present  Diane Angelina Pih, MS, EP, [facility], Exercise Physiologist;Dameisha Tschida Luther Parody, BS, EP, Exercise Physiologist    Supervising physician immediately available to respond to emergencies  See telemetry face sheet for immediately available MD    Medication changes reported      No    Fall or balance concerns reported     Yes    Comments  Fallen 1 ime in 12 months. He also gets weak in his knees at times    Warm-up and Cool-down  Performed as group-led instruction    Resistance Training Performed  Yes    VAD Patient?  No    PAD/SET Patient?  No      Pain Assessment   Currently in Pain?  No/denies    Pain Score  0-No pain    Multiple Pain Sites  No       Capillary Blood Glucose: No results found for this or any previous visit (from the past 24 hour(s)).    Social History   Tobacco Use  Smoking Status Former Smoker  . Packs/day: 1.00  . Last attempt to quit: 12/11/1998  . Years since quitting: 19.1  Smokeless Tobacco Never Used    Goals Met:  Independence with exercise equipment Improved SOB with ADL's Using PLB without cueing & demonstrates good technique Exercise tolerated well No report of cardiac concerns or symptoms Strength training completed today  Goals Unmet:  Not Applicable  Comments: Check out 230   Dr. Sinda Du is Medical Director for [facility] Pulmonary Rehab.

## 2018-02-13 ENCOUNTER — Encounter (HOSPITAL_COMMUNITY)
Admission: RE | Admit: 2018-02-13 | Discharge: 2018-02-13 | Disposition: A | Discharge status: Home / Self Care | Payer: PPO | Source: Ambulatory Visit | Attending: Pulmonary Disease | Admitting: Pulmonary Disease

## 2018-02-13 DIAGNOSIS — J449 Chronic obstructive pulmonary disease, unspecified: Secondary | ICD-10-CM

## 2018-02-13 NOTE — Progress Notes (Signed)
Daily Session Note  Patient Details  Name: Peter Lopez MRN: [id number] Date of Birth: 09-11-1935 Referring Provider:     PULMONARY REHAB COPD ORIENTATION from 11/20/2017 in [place]  Referring Provider  DR. Luan Pulling      Encounter Date: 02/13/2018  Check In: Session Check In - 02/13/18 1330      Check-In   Location  [facility]    Staff Present  Diane Angelina Pih, MS, EP, [facility], Exercise Physiologist;Amman Bartel Luther Parody, BS, EP, Exercise Physiologist;Debra Wynetta Emery, RN, BSN    Supervising physician immediately available to respond to emergencies  See telemetry face sheet for immediately available MD    Medication changes reported      No    Fall or balance concerns reported     Yes    Comments  Fallen 1 ime in 12 months. He also gets weak in his knees at times    Warm-up and Cool-down  Performed as group-led instruction    Resistance Training Performed  Yes    VAD Patient?  No    PAD/SET Patient?  No      Pain Assessment   Currently in Pain?  No/denies    Pain Score  0-No pain    Multiple Pain Sites  No       Capillary Blood Glucose: No results found for this or any previous visit (from the past 24 hour(s)).    Social History   Tobacco Use  Smoking Status Former Smoker  . Packs/day: 1.00  . Last attempt to quit: 12/11/1998  . Years since quitting: 19.1  Smokeless Tobacco Never Used    Goals Met:  Independence with exercise equipment Improved SOB with ADL's Using PLB without cueing & demonstrates good technique Exercise tolerated well No report of cardiac concerns or symptoms Strength training completed today  Goals Unmet:  Not Applicable  Comments: Check out 230   Dr. Sinda Du is Medical Director for [facility] Pulmonary Rehab.

## 2018-02-18 ENCOUNTER — Encounter (HOSPITAL_COMMUNITY)
Admission: RE | Admit: 2018-02-18 | Discharge: 2018-02-18 | Disposition: A | Discharge status: Home / Self Care | Payer: PPO | Source: Ambulatory Visit | Attending: Pulmonary Disease | Admitting: Pulmonary Disease

## 2018-02-18 DIAGNOSIS — J449 Chronic obstructive pulmonary disease, unspecified: Secondary | ICD-10-CM | POA: Diagnosis not present

## 2018-02-18 NOTE — Progress Notes (Signed)
Daily Session Note  Patient Details  Name: Peter Lopez MRN: [id number] Date of Birth: September 02, 1935 Referring Provider:     PULMONARY REHAB COPD ORIENTATION from 11/20/2017 in [place]  Referring Provider  DR. Luan Pulling      Encounter Date: 02/18/2018  Check In: Session Check In - 02/18/18 1330      Check-In   Location  [facility]    Staff Present  Diane Angelina Pih, MS, EP, [facility], Exercise Physiologist;Vester T[place] Luther Parody, BS, EP, Exercise Physiologist    Supervising physician immediately available to respond to emergencies  See telemetry face sheet for immediately available MD    Medication changes reported      No    Fall or balance concerns reported     Yes    Comments  Fallen 1 ime in 12 months. He also gets weak in his knees at times    Warm-up and Cool-down  Performed as group-led instruction    Resistance Training Performed  Yes    VAD Patient?  No    PAD/SET Patient?  No      Pain Assessment   Currently in Pain?  No/denies    Pain Score  0-No pain    Multiple Pain Sites  No       Capillary Blood Glucose: No results found for this or any previous visit (from the past 24 hour(s)).  Exercise Prescription Changes - 02/18/18 0800      Response to Exercise   Blood Pressure (Admit)  132/64    Blood Pressure (Exercise)  142/70    Blood Pressure (Exit)  136/70    Heart Rate (Admit)  82 bpm    Heart Rate (Exercise)  88 bpm    Heart Rate (Exit)  89 bpm    Oxygen Saturation (Admit)  91 %    Oxygen Saturation (Exercise)  93 %    Oxygen Saturation (Exit)  97 %    Rating of Perceived Exertion (Exercise)  13    Perceived Dyspnea (Exercise)  13    Duration  Progress to 30 minutes of  aerobic without signs/symptoms of physical distress    Intensity  THRR New 105-117-128      Progression   Progression  Continue to progress workloads to maintain intensity without signs/symptoms of physical distress.      Resistance Training   Training Prescription   Yes    Weight  4    Reps  10-15      NuStep   Level  1    SPM  75    Minutes  20    METs  1.8      Arm Ergometer   Level  1.3    Watts  8    Minutes  15    METs  1.8      Home Exercise Plan   Plans to continue exercise at  Home (comment)    Frequency  Add 3 additional days to program exercise sessions.    Initial Home Exercises Provided  12/05/17       Social History   Tobacco Use  Smoking Status Former Smoker  . Packs/day: 1.00  . Last attempt to quit: 12/11/1998  . Years since quitting: 19.2  Smokeless Tobacco Never Used    Goals Met:  Independence with exercise equipment Improved SOB with ADL's Using PLB without cueing & demonstrates good technique Exercise tolerated well No report of cardiac concerns or symptoms Strength training completed today  Goals Unmet:  Not Applicable  Comments: Check out 230   Dr. Edward Hawkins is Medical Director for [facility] Pulmonary Re[facility]. 

## 2018-02-19 DIAGNOSIS — F17200 Nicotine dependence, unspecified, uncomplicated: Secondary | ICD-10-CM | POA: Diagnosis not present

## 2018-02-19 DIAGNOSIS — Z191 Hormone sensitive malignancy status: Secondary | ICD-10-CM | POA: Diagnosis not present

## 2018-02-19 DIAGNOSIS — Z79899 Other long term (current) drug therapy: Secondary | ICD-10-CM | POA: Diagnosis not present

## 2018-02-19 DIAGNOSIS — C61 Malignant neoplasm of prostate: Secondary | ICD-10-CM | POA: Diagnosis not present

## 2018-02-19 DIAGNOSIS — C3431 Malignant neoplasm of lower lobe, right bronchus or lung: Secondary | ICD-10-CM | POA: Diagnosis not present

## 2018-02-19 DIAGNOSIS — Z5181 Encounter for therapeutic drug level monitoring: Secondary | ICD-10-CM | POA: Diagnosis not present

## 2018-02-19 DIAGNOSIS — Z5111 Encounter for antineoplastic chemotherapy: Secondary | ICD-10-CM | POA: Diagnosis not present

## 2018-02-20 ENCOUNTER — Encounter (HOSPITAL_COMMUNITY): Payer: PPO

## 2018-02-25 ENCOUNTER — Encounter (HOSPITAL_COMMUNITY)
Admission: RE | Admit: 2018-02-25 | Discharge: 2018-02-25 | Disposition: A | Discharge status: Home / Self Care | Payer: PPO | Source: Ambulatory Visit | Attending: Pulmonary Disease | Admitting: Pulmonary Disease

## 2018-02-25 DIAGNOSIS — J449 Chronic obstructive pulmonary disease, unspecified: Secondary | ICD-10-CM

## 2018-02-25 NOTE — Progress Notes (Signed)
Daily Session Note  Patient Details  Name: Peter Lopez MRN: [id number] Date of Birth: 1936/06/28 Referring Provider:     PULMONARY REHAB COPD ORIENTATION from 11/20/2017 in [place]  Referring Provider  DR. Luan Pulling      Encounter Date: 02/25/2018  Check In: Session Check In - 02/25/18 1330      Check-In   Location  [facility]    Staff Present  Diane Angelina Pih, MS, EP, [facility], Exercise Physiologist;Lucerito Rosinski Luther Parody, BS, EP, Exercise Physiologist    Supervising physician immediately available to respond to emergencies  See telemetry face sheet for immediately available MD    Medication changes reported      No    Fall or balance concerns reported     Yes    Comments  Fallen 1 ime in 12 months. He also gets weak in his knees at times    Warm-up and Cool-down  Performed as group-led instruction    Resistance Training Performed  Yes    VAD Patient?  No    PAD/SET Patient?  No      Pain Assessment   Currently in Pain?  No/denies    Pain Score  0-No pain    Multiple Pain Sites  No       Capillary Blood Glucose: No results found for this or any previous visit (from the past 24 hour(s)).    Social History   Tobacco Use  Smoking Status Former Smoker  . Packs/day: 1.00  . Last attempt to quit: 12/11/1998  . Years since quitting: 19.2  Smokeless Tobacco Never Used    Goals Met:  Independence with exercise equipment Improved SOB with ADL's Using PLB without cueing & demonstrates good technique Exercise tolerated well No report of cardiac concerns or symptoms Strength training completed today  Goals Unmet:  Not Applicable  Comments: Check out 230   Dr. Sinda Du is Medical Director for [facility].

## 2018-02-27 ENCOUNTER — Encounter: Payer: PPO | Admitting: Internal Medicine

## 2018-02-27 ENCOUNTER — Encounter (HOSPITAL_COMMUNITY): Payer: PPO

## 2018-02-27 NOTE — Progress Notes (Signed)
Pulmonary Individual Treatment Plan  Patient Details  Name: Peter Lopez MRN: [id number] Date of Birth: 1935-09-12 Referring Provider:     PULMONARY REHAB COPD ORIENTATION from 11/20/2017 in [place]  Referring Provider  DR. Hawkins      Initial Encounter Date:    PULMONARY REHAB COPD ORIENTATION from 11/20/2017 in [place]  Date  11/20/17      Visit Diagnosis: Chronic obstructive pulmonary disease, unspecified COPD type ([place])  Patient's Home Medications on Admission:   Current Outpatient Medications:  .  acetaminophen (TYLENOL) 500 MG tablet, Take 1,000 mg by mouth every 6 (six) hours as needed for moderate pain., Disp: , Rfl:  .  amLODipine-olmesartan (AZOR) 5-40 MG per tablet, Take 1 tablet by mouth daily., Disp: , Rfl:  .  aspirin EC 81 MG tablet, Take 81 mg by mouth daily., Disp: , Rfl:  .  Calcium Citrate-Vitamin D (CALCIUM + D PO), Take 1,200 mg by mouth daily., Disp: , Rfl:  .  enzalutamide (XTANDI) 40 MG capsule, Take 160 mg by mouth daily., Disp: , Rfl:  .  folic acid (FOLVITE) 1 MG tablet, Take 1 mg by mouth daily., Disp: , Rfl:  .  Leuprolide Acetate, 6 Month, (LUPRON DEPOT, 35-MONTH,) 45 MG injection, Inject 45 mg into the muscle every 6 (six) months., Disp: , Rfl:  .  methotrexate (RHEUMATREX) 2.5 MG tablet, Take 2.5 mg by mouth once a week. sunday, Disp: , Rfl:  .  potassium chloride SA (K-DUR,KLOR-CON) 20 MEQ tablet, take 2 tablets by mouth once daily, Disp: 60 tablet, Rfl: 6 .  propranolol (INDERAL) 40 MG tablet, Take 40 mg by mouth daily. , Disp: , Rfl:  .  simvastatin (ZOCOR) 40 MG tablet, Take 40 mg by mouth daily.  , Disp: , Rfl:  .  torsemide (DEMADEX) 20 MG tablet, Take 20 mg by mouth daily. , Disp: , Rfl:   Past Medical History: Past Medical History:  Diagnosis Date  . 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 Benign essential tremor   . Exertional dyspnea   . Hyperlipidemia   . Hypertension   . Pacemaker  11/24/2012   Dr Lovena Le  . Prostate cancer ([place])    S/P Prostatectomy  . Tobacco abuse    30 pack years; discontinued in 2000; currently one pack per week    Tobacco Use: Social History   Tobacco Use  Smoking Status Former Smoker  . Packs/day: 1.00  . Last attempt to quit: 12/11/1998  . Years since quitting: 19.2  Smokeless Tobacco Never Used    Labs: Recent Chemical engineer    Labs for ITP Cardiac and Pulmonary Rehab Latest Ref Rng & Units 05/03/2010 07/22/2010 12/07/2011 03/01/2016   Cholestrol 0 - 200 mg/dL - 159 163 -   LDLCALC 0 - 99 mg/dL - 89 86 -   HDL >39 mg/dL - 58 57 -   Trlycerides <150 mg/dL - 60 100 -   PHART 7.350 - 7.450 7.428 - - -   PCO2ART 35.0 - 45.0 mmHg 36.2 - - -   HCO3 20.0 - 24.0 mEq/L 23.5 - - 27.9(H)   TCO2 0 - 100 mmol/L 20.2 - - 29   O2SAT % 96.4 - - 62.0      Capillary Blood Glucose: No results found for: GLUCAP   Pulmonary Assessment Scores: Pulmonary Assessment Scores    Row Name 11/20/17 [place]  ADL Phase  Entry     SOB Score total  31     Rest  0     Walk  7     Stairs  3     Bath  1     Dress  1     Shop  0       CAT Score   CAT Score  14       mMRC Score   mMRC Score  2        Pulmonary Function Assessment: Pulmonary Function Assessment - 11/20/17 1021      Pulmonary Function Tests   FVC%  3.86 %    FEV1%  2.74 %    FEV1/FVC Ratio  72    RV%  2.6 %    DLCO%  30.99 %      Initial Spirometry Results   FVC%  3.86 %    FEV1%  2.74 %    FEV1/FVC Ratio  72      Post Bronchodilator Spirometry Results   FVC%  94 %    FEV1%  70 %    FEV1/FVC Ratio  73    Comments  Actual FEV/FVC = 53      Breath   Bilateral Breath Sounds  Clear    Shortness of Breath  -- with exertion       Exercise Target Goals:    Exercise Program Goal: Individual exercise prescription set using results from initial 6 min walk test and THRR while considering  patient's activity barriers and safety.   Exercise  Prescription Goal: Initial exercise prescription builds to 30-45 minutes a day of aerobic activity, 2-3 days per week.  Home exercise guidelines will be given to patient during program as part of exercise prescription that the participant will acknowledge.  Activity Barriers & Risk Stratification: Activity Barriers & Cardiac Risk Stratification - 11/20/17 1012      Activity Barriers & Cardiac Risk Stratification   Activity Barriers  Other (comment);Joint Problems;Balance Concerns    Comments  Tremors    Cardiac Risk Stratification  High       6 Minute Walk: 6 Minute Walk    Row Name 11/20/17 1011         6 Minute Walk   Phase  Initial     Distance  750 feet     Distance % Change  0 %     Distance Feet Change  0 ft     Walk Time  6 minutes     # of Rest Breaks  1     MPH  1.42     METS  2.08     RPE  12     Perceived Dyspnea   13     VO2 Peak  7.56     Symptoms  No     Resting HR  70 bpm     Resting BP  126/70     Resting Oxygen Saturation   97 %     Exercise Oxygen Saturation  during 6 min walk  89 %     Max Ex. HR  104 bpm     Max Ex. BP  160/74     2 Minute Post BP  130/70        Oxygen Initial Assessment: Oxygen Initial Assessment - 11/20/17 1130      Home Oxygen   Home Oxygen Device  None    Sleep Oxygen Prescription  None    Home  Exercise Oxygen Prescription  None    Home at Rest Exercise Oxygen Prescription  None      Program Oxygen Prescription   Program Oxygen Prescription  None       Oxygen Re-Evaluation: Oxygen Re-Evaluation    Row Name 12/19/17 1443 01/09/18 1538 01/31/18 1419 02/27/18 0906       Program Oxygen Prescription   Program Oxygen Prescription  None  None  None  None      Home Oxygen   Home Oxygen Device  None  None  None  None    Sleep Oxygen Prescription  None  None  None  None    Home Exercise Oxygen Prescription  None  None  None  None    Home at Rest Exercise Oxygen Prescription  None  None  None  None      Goals/Expected  Outcomes   Short Term Goals  To learn and demonstrate proper pursed lip breathing techniques or other breathing techniques.;To learn and understand importance of maintaining oxygen saturations>88%;To learn and understand importance of monitoring SPO2 with pulse oximeter and demonstrate accurate use of the pulse oximeter.  To learn and demonstrate proper pursed lip breathing techniques or other breathing techniques.;To learn and understand importance of maintaining oxygen saturations>88%;To learn and understand importance of monitoring SPO2 with pulse oximeter and demonstrate accurate use of the pulse oximeter.  To learn and demonstrate proper pursed lip breathing techniques or other breathing techniques.;To learn and understand importance of maintaining oxygen saturations>88%;To learn and understand importance of monitoring SPO2 with pulse oximeter and demonstrate accurate use of the pulse oximeter.  To learn and demonstrate proper pursed lip breathing techniques or other breathing techniques.;To learn and understand importance of maintaining oxygen saturations>88%;To learn and understand importance of monitoring SPO2 with pulse oximeter and demonstrate accurate use of the pulse oximeter.    Long  Term Goals  Exhibits proper breathing techniques, such as pursed lip breathing or other method taught during program session;Maintenance of O2 saturations>88%;Verbalizes importance of monitoring SPO2 with pulse oximeter and return demonstration  -  Exhibits proper breathing techniques, such as pursed lip breathing or other method taught during program session;Maintenance of O2 saturations>88%;Verbalizes importance of monitoring SPO2 with pulse oximeter and return demonstration  Exhibits proper breathing techniques, such as pursed lip breathing or other method taught during program session;Maintenance of O2 saturations>88%;Verbalizes importance of monitoring SPO2 with pulse oximeter and return demonstration     Comments  Patient is able to demonstrate proper usage of pulse oximeter and pursed lip breathing during exercise. He also is able to verbalize the importance of maintaining his O2 sat level >88%.  Patient is able to demonstrate proper usage of pulse oximeter and pursed lip breathing during exercise. He also is able to verbalize the importance of maintaining his O2 sat level >88%.  Patient is able to demonstrate proper usage of pulse oximeter and pursed lip breathing during exercise. He also is able to verbalize the importance of maintaining his O2 sat level >88%.  Patient is able to demonstrate proper usage of pulse oximeter and pursed lip breathing during exercise. He also is able to verbalize the importance of maintaining his O2 sat level >88%.    Goals/Expected Outcomes  Patient will continue to meet his short and long term goals.   Patient will continue to meet his short and long term goals.   Patient will continue to meet his short and long term goals.   Patient will continue to meet his short and  long term goals.        Oxygen Discharge (Final Oxygen Re-Evaluation): Oxygen Re-Evaluation - 02/27/18 0906      Program Oxygen Prescription   Program Oxygen Prescription  None      Home Oxygen   Home Oxygen Device  None    Sleep Oxygen Prescription  None    Home Exercise Oxygen Prescription  None    Home at Rest Exercise Oxygen Prescription  None      Goals/Expected Outcomes   Short Term Goals  To learn and demonstrate proper pursed lip breathing techniques or other breathing techniques.;To learn and understand importance of maintaining oxygen saturations>88%;To learn and understand importance of monitoring SPO2 with pulse oximeter and demonstrate accurate use of the pulse oximeter.    Long  Term Goals  Exhibits proper breathing techniques, such as pursed lip breathing or other method taught during program session;Maintenance of O2 saturations>88%;Verbalizes importance of monitoring SPO2 with  pulse oximeter and return demonstration    Comments  Patient is able to demonstrate proper usage of pulse oximeter and pursed lip breathing during exercise. He also is able to verbalize the importance of maintaining his O2 sat level >88%.    Goals/Expected Outcomes  Patient will continue to meet his short and long term goals.        Initial Exercise Prescription: Initial Exercise Prescription - 11/20/17 1000      Date of Initial Exercise RX and Referring Provider   Date  11/20/17    Referring Provider  DR. Hawkins      Treadmill   MPH  1    Grade  0    Minutes  15    METs  1.7      NuStep   Level  1    SPM  46    Minutes  20    METs  1.8      Prescription Details   Frequency (times per week)  3    Duration  Progress to 30 minutes of continuous aerobic without signs/symptoms of physical distress      Intensity   THRR 40-80% of Max Heartrate  [id number]    Ratings of Perceived Exertion  11-13    Perceived Dyspnea  0-4      Progression   Progression  Continue progressive overload as per policy without signs/symptoms or physical distress.      Resistance Training   Training Prescription  Yes    Weight  1    Reps  10-15       Perform Capillary Blood Glucose checks as needed.  Exercise Prescription Changes:  Exercise Prescription Changes    Row Name 12/12/17 0800 12/24/17 1000 01/06/18 1500 01/20/18 1400 02/05/18 1400     Response to Exercise   Blood Pressure (Admit)  140/70  136/62  150/70  150/72  142/72   Blood Pressure (Exercise)  150/74  146/74  160/80  158/76  160/80   Blood Pressure (Exit)  140/70  140/66  142/70  150/70  160/90   Heart Rate (Admit)  66 bpm  60 bpm  71 bpm  78 bpm  57 bpm   Heart Rate (Exercise)  91 bpm  79 bpm  99 bpm  105 bpm  64 bpm   Heart Rate (Exit)  75 bpm  75 bpm  78 bpm  80 bpm  85 bpm   Oxygen Saturation (Admit)  95 %  96 %  96 %  97 %  99 %   Oxygen  Saturation (Exercise)  94 %  90 %  93 %  93 %  91 %   Oxygen Saturation (Exit)   94 %  97 %  93 %  95 %  97 %   Rating of Perceived Exertion (Exercise)  13  13  10  12  14    Perceived Dyspnea (Exercise)  14  13  11  12  12    Duration  Progress to 30 minutes of  aerobic without signs/symptoms of physical distress  Progress to 30 minutes of  aerobic without signs/symptoms of physical distress  Progress to 30 minutes of  aerobic without signs/symptoms of physical distress  Progress to 30 minutes of  aerobic without signs/symptoms of physical distress  Progress to 30 minutes of  aerobic without signs/symptoms of physical distress   Intensity  THRR New 96-112-128  THRR New 92-108-124  THRR New 99-112-126  THRR New 103-122-136  THRR New 90-107-123     Progression   Progression  Continue to progress workloads to maintain intensity without signs/symptoms of physical distress.  Continue to progress workloads to maintain intensity without signs/symptoms of physical distress.  Continue to progress workloads to maintain intensity without signs/symptoms of physical distress.  Continue to progress workloads to maintain intensity without signs/symptoms of physical distress.  Continue to progress workloads to maintain intensity without signs/symptoms of physical distress.     Resistance Training   Training Prescription  Yes  Yes  Yes  Yes  Yes   Weight  3  2  2  3  4    Reps  10-15  10-15  10-15  10-15  10-15     Treadmill   MPH  1  1.2  1.2  -  1.2   Grade  0  0  0  -  0   Minutes  15  15  15   -  15   METs  1.7  1.9  1.9  -  1.9     NuStep   Level  1  1  1  1  1    SPM  87  72  84  88  82   Minutes  20  20  20  20  20    METs  1.7  1.8  1.9  2.2  1.9     Arm Ergometer   Level  -  -  -  1.3  -   Watts  -  -  -  12  -   Minutes  -  -  -  15  -   METs  -  -  -  1.9  -     Home Exercise Plan   Plans to continue exercise at  Home (comment)  Home (comment)  Home (comment)  Home (comment)  Home (comment)   Frequency  Add 3 additional days to program exercise sessions.  Add 3 additional  days to program exercise sessions.  Add 3 additional days to program exercise sessions.  Add 3 additional days to program exercise sessions.  Add 3 additional days to program exercise sessions.   Initial Home Exercises Provided  12/05/17  12/05/17  12/05/17  12/05/17  12/05/17   Row Name 02/18/18 0800             Response to Exercise   Blood Pressure (Admit)  132/64       Blood Pressure (Exercise)  142/70       Blood Pressure (Exit)  136/70  Heart Rate (Admit)  82 bpm       Heart Rate (Exercise)  88 bpm       Heart Rate (Exit)  89 bpm       Oxygen Saturation (Admit)  91 %       Oxygen Saturation (Exercise)  93 %       Oxygen Saturation (Exit)  97 %       Rating of Perceived Exertion (Exercise)  13       Perceived Dyspnea (Exercise)  13       Duration  Progress to 30 minutes of  aerobic without signs/symptoms of physical distress       Intensity  THRR New [id number]         Progression   Progression  Continue to progress workloads to maintain intensity without signs/symptoms of physical distress.         Resistance Training   Training Prescription  Yes       Weight  4       Reps  10-15         NuStep   Level  1       SPM  75       Minutes  20       METs  1.8         Arm Ergometer   Level  1.3       Watts  8       Minutes  15       METs  1.8         Home Exercise Plan   Plans to continue exercise at  Home (comment)       Frequency  Add 3 additional days to program exercise sessions.       Initial Home Exercises Provided  12/05/17          Exercise Comments:  Exercise Comments    Row Name 12/12/17 0809 12/24/17 1021 01/06/18 1513 01/20/18 1443 02/05/18 1403   Exercise Comments  Patient is doing well in PR and has increased his SPMs on the nustep machine while maintaining his speed on the treadmill. Patient O2 levels are looking good while he is being active not often dropping below 90. We ar eworking on increasing his stability levels by helping him keep his  balance on the treadmill by holding on to the support bars. We will continue to monitor the patient throughout the remainder of the program.   Patient continues to do well in PR. Patient has increased his speed on the treadmill to 1.2. Patient has also been maintaining his level and SPMs on the machine. Patient seems to be enjoying the program and has stated that he is feeling some energy coming back to him since he is only on 8 sessions of the program.   Patient continues to do well in PR. Patient has maintained his speed on the treadmill and has also maintained his level on the nustep machine. patient has increased his SPMs and METS on the nustep. Patient stated that He has better mobility since beginning the program and can breathe better now.   Patient is doing well in PR. Patient has been moved off of the treadmill due to uneasiness. Patient was moved to the arm ergometer and has been doing well on level 1.3. Patient has also increased his SPMs on the nustep machine and has increased his weight for the dumbbells to 3lbs each session.   Patient has started using the treadmill  again and has been affected by the cancer treatments that he has been receiving. Patien thas not progressed due to these treatments making him fatigued and weak.    [place] Name 02/18/18 [phone]           Exercise Comments  Patient has been doing well in PR. Patient has been moved to the arm ergometer due to lower extremity weakness caused from cancer treatments. Patient has been doing well on both machines and has positive attitude.           Exercise Goals and Review:  Exercise Goals    Row Name 11/20/17 1015             Exercise Goals   Increase Physical Activity  Yes       Intervention  Provide advice, education, support and counseling about physical activity/exercise needs.;Develop an individualized exercise prescription for aerobic and resistive training based on initial evaluation findings, risk stratification,  comorbidities and participant's personal goals.       Expected Outcomes  Short Term: Attend rehab on a regular basis to increase amount of physical activity.       Increase Strength and Stamina  Yes       Intervention  Provide advice, education, support and counseling about physical activity/exercise needs.;Develop an individualized exercise prescription for aerobic and resistive training based on initial evaluation findings, risk stratification, comorbidities and participant's personal goals.       Expected Outcomes  Short Term: Increase workloads from initial exercise prescription for resistance, speed, and METs.;Long Term: Improve cardiorespiratory fitness, muscular endurance and strength as measured by increased METs and functional capacity (6MWT);Short Term: Perform resistance training exercises routinely during rehab and add in resistance training at home       Able to understand and use rate of perceived exertion (RPE) scale  Yes       Intervention  Provide education and explanation on how to use RPE scale       Expected Outcomes  Short Term: Able to use RPE daily in rehab to express subjective intensity level;Long Term:  Able to use RPE to guide intensity level when exercising independently       Able to understand and use Dyspnea scale  Yes       Intervention  Provide education and explanation on how to use Dyspnea scale       Expected Outcomes  Short Term: Able to use Dyspnea scale daily in rehab to express subjective sense of shortness of breath during exertion;Long Term: Able to use Dyspnea scale to guide intensity level when exercising independently       Knowledge and understanding of Target Heart Rate Range (THRR)  Yes       Intervention  Provide education and explanation of THRR including how the numbers were predicted and where they are located for reference       Expected Outcomes  Short Term: Able to state/look up THRR;Long Term: Able to use THRR to govern intensity when exercising  independently;Short Term: Able to use daily as guideline for intensity in rehab       Able to check pulse independently  Yes       Intervention  Provide education and demonstration on how to check pulse in carotid and radial arteries.;Review the importance of being able to check your own pulse for safety during independent exercise       Expected Outcomes  Short Term: Able to explain why pulse checking is important during independent exercise;Long Term:  Able to check pulse independently and accurately       Understanding of Exercise Prescription  Yes       Intervention  Provide education, explanation, and written materials on patient's individual exercise prescription       Expected Outcomes  Short Term: Able to explain program exercise prescription;Long Term: Able to explain home exercise prescription to exercise independently          Exercise Goals Re-Evaluation : Exercise Goals Re-Evaluation    Row Name 12/16/17 1508 01/06/18 1514 01/28/18 0821 02/24/18 1507       Exercise Goal Re-Evaluation   Exercise Goals Review  Increase Physical Activity;Increase Strength and Stamina;Able to understand and use rate of perceived exertion (RPE) scale;Able to check pulse independently;Knowledge and understanding of Target Heart Rate Range (THRR);Able to understand and use Dyspnea scale;Understanding of Exercise Prescription  Increase Physical Activity;Increase Strength and Stamina;Able to understand and use rate of perceived exertion (RPE) scale;Able to check pulse independently;Knowledge and understanding of Target Heart Rate Range (THRR);Able to understand and use Dyspnea scale;Understanding of Exercise Prescription  Increase Physical Activity;Increase Strength and Stamina;Able to understand and use rate of perceived exertion (RPE) scale;Able to check pulse independently;Knowledge and understanding of Target Heart Rate Range (THRR);Able to understand and use Dyspnea scale;Understanding of Exercise  Prescription  Increase Physical Activity;Increase Strength and Stamina;Able to understand and use rate of perceived exertion (RPE) scale;Able to check pulse independently;Knowledge and understanding of Target Heart Rate Range (THRR);Able to understand and use Dyspnea scale;Understanding of Exercise Prescription    Comments  Patient has been doing well since beginning the program. Patient is only on sixth session but has shown great potential on the nustep machine gaining an average SPMs of 87 so far. Patient has also maintained his speed on the treadmill and has kept his O2 level above 90 while on it. Patients goals are being worked on becoming met. Patient will be continued to be monitored throughout the remainder of the program.   Patient continues to do well in PR. Patient has maintained his speed on the treadmill and has also maintained his level on the nustep machine. patient has increased his SPMs and METS on the nustep. Patient stated that He has better mobility since beginning the program and can breathe better now.   Patient continues to do well in CR. patient has stated to me that he still feels uneasy with his legs which is hindering much progression on the treadmill however he does feel more endurance since beginning the program which is helping him throughout the day.   Patient is doing very well in PR. Patient has stated to me that he has been breathing better and has much better breathing technique since beginning the program. His legs still are weak from cancer treatments and that is hindering much progression especially on the treadmill machine.     Expected Outcomes  Patient wishes to increase his lung capacity and to strength legs as well as gain stability.   Patient wishes to increase his lung capacity and to strength legs as well as gain stability.   Patient wishes to increase his lung capacity and to strength legs as well as gain stability.   Patient wishes to increase his lung capacity and  to strength legs as well as gain stability.        Discharge Exercise Prescription (Final Exercise Prescription Changes): Exercise Prescription Changes - 02/18/18 0800      Response to Exercise   Blood Pressure (Admit)  132/64    Blood Pressure (Exercise)  142/70    Blood Pressure (Exit)  136/70    Heart Rate (Admit)  82 bpm    Heart Rate (Exercise)  88 bpm    Heart Rate (Exit)  89 bpm    Oxygen Saturation (Admit)  91 %    Oxygen Saturation (Exercise)  93 %    Oxygen Saturation (Exit)  97 %    Rating of Perceived Exertion (Exercise)  13    Perceived Dyspnea (Exercise)  13    Duration  Progress to 30 minutes of  aerobic without signs/symptoms of physical distress    Intensity  THRR New [id number]      Progression   Progression  Continue to progress workloads to maintain intensity without signs/symptoms of physical distress.      Resistance Training   Training Prescription  Yes    Weight  4    Reps  10-15      NuStep   Level  1    SPM  75    Minutes  20    METs  1.8      Arm Ergometer   Level  1.3    Watts  8    Minutes  15    METs  1.8      Home Exercise Plan   Plans to continue exercise at  Home (comment)    Frequency  Add 3 additional days to program exercise sessions.    Initial Home Exercises Provided  12/05/17       Nutrition:  Target Goals: Understanding of nutrition guidelines, daily intake of sodium <1539m, cholesterol <2053m calories 30% from fat and 7% or less from saturated fats, daily to have 5 or more servings of fruits and vegetables.  Biometrics: Pre Biometrics - 11/20/17 1015      Pre Biometrics   Height  5' 9"  (1.753 m)    Weight  134 lb 14.4 oz (61.2 kg)    Waist Circumference  32 inches    Hip Circumference  36 inches    Waist to Hip Ratio  0.89 %    BMI (Calculated)  19.91    Triceps Skinfold  10 mm    % Body Fat  19.7 %    Grip Strength  44.53 kg    Flexibility  0 in    Single Leg Stand  0 seconds        Nutrition Therapy  Plan and Nutrition Goals: Nutrition Therapy & Goals - 11/21/17 1524      Personal Nutrition Goals   Nutrition Goal  For heart healthy choices add >50% of whole grains, make half their plate fruits and vegetables. Discuss the difference between starchy vegetables and leafy greens, and how leafy vegetables provide fiber, helps maintain healthy weight, helps control blood glucose, and lowers cholesterol.  Discuss purchasing fresh or frozen vegetable to reduce sodium and not to add grease, fat or sugar. Consume <18oz of red meat per week. Consume lean cuts of meats and very little of meats high in sodium and nitrates such as pork and lunch meats. Discussed portion control for all food groups.      Personal Goal #2  He eats very little. When is eating he eats a heart healthy diet.     Comments  Patient met with RD 11/21/17.      Intervention Plan   Intervention  Nutrition handout(s) given to patient.    Expected Outcomes  Short Term Goal: Understand basic principles  of dietary content, such as calories, fat, sodium, cholesterol and nutrients.       Nutrition Assessments: Nutrition Assessments - 11/20/17 1132      MEDFICTS Scores   Pre Score  3       Nutrition Goals Re-Evaluation: Nutrition Goals Re-Evaluation    [place] Name 12/19/17 1445 01/09/18 1538 01/31/18 1419 02/27/18 0906       Goals   Current Weight  136 lb (61.7 kg)  135 lb (61.2 kg)  138 lb (62.6 kg)  136 lb (61.7 kg)    Nutrition Goal  For heart healthy choices add >50% of whole grains, make half their plate fruits and vegetables. Discuss the difference between starchy vegetables and leafy greens, and how leafy vegetables provide fiber, helps maintain healthy weight, helps control blood glucose, and lowers cholesterol.  Discuss purchasing fresh or frozen vegetable to reduce sodium and not to add grease, fat or sugar. Consume <18oz of red meat per week. Consume lean cuts of meats and very little of meats high in sodium and nitrates such  as pork and lunch meats. Discussed portion control for all food groups.    For heart healthy choices add >50% of whole grains, make half their plate fruits and vegetables. Discuss the difference between starchy vegetables and leafy greens, and how leafy vegetables provide fiber, helps maintain healthy weight, helps control blood glucose, and lowers cholesterol.  Discuss purchasing fresh or frozen vegetable to reduce sodium and not to add grease, fat or sugar. Consume <18oz of red meat per week. Consume lean cuts of meats and very little of meats high in sodium and nitrates such as pork and lunch meats. Discussed portion control for all food groups.    For heart healthy choices add >50% of whole grains, make half their plate fruits and vegetables. Discuss the difference between starchy vegetables and leafy greens, and how leafy vegetables provide fiber, helps maintain healthy weight, helps control blood glucose, and lowers cholesterol.  Discuss purchasing fresh or frozen vegetable to reduce sodium and not to add grease, fat or sugar. Consume <18oz of red meat per week. Consume lean cuts of meats and very little of meats high in sodium and nitrates such as pork and lunch meats. Discussed portion control for all food groups.    For heart healthy choices add >50% of whole grains, make half their plate fruits and vegetables. Discuss the difference between starchy vegetables and leafy greens, and how leafy vegetables provide fiber, helps maintain healthy weight, helps control blood glucose, and lowers cholesterol.  Discuss purchasing fresh or frozen vegetable to reduce sodium and not to add grease, fat or sugar. Consume <18oz of red meat per week. Consume lean cuts of meats and very little of meats high in sodium and nitrates such as pork and lunch meats. Discussed portion control for all food groups.      Comment  Patient has gained 2 lbs since his initial visit. He says he eat a healthy diet. Will continue to monitor.    Patient has lost 1 lb since last 30 day review. He continues to say he is trying to eat a healthy diet. Will continue to monitor for progress.   Patient has lost 3 lbs since last 30 day review. He continues to say he is trying to eat a healthy diet. Will continue to monitor for progress.   Patient has lost 2 lbs since his last 30 day review. He says he continues to eat a healthy diet. He  continues to have a reduced appetite. Will continue to monitor for progress.     Expected Outcome  -  Patient will continue to meet his nutritional goals.   Patient will continue to meet his nutritional goals.   Patient will continue to meet his nutritional goals.       Personal Goal #2 Re-Evaluation   Personal Goal #2  He eats very little. When is eating he eats a heart healthy diet.   He eats very little. When is eating he eats a heart healthy diet.   -  -       Nutrition Goals Discharge (Final Nutrition Goals Re-Evaluation): Nutrition Goals Re-Evaluation - 02/27/18 0906      Goals   Current Weight  136 lb (61.7 kg)    Nutrition Goal  For heart healthy choices add >50% of whole grains, make half their plate fruits and vegetables. Discuss the difference between starchy vegetables and leafy greens, and how leafy vegetables provide fiber, helps maintain healthy weight, helps control blood glucose, and lowers cholesterol.  Discuss purchasing fresh or frozen vegetable to reduce sodium and not to add grease, fat or sugar. Consume <18oz of red meat per week. Consume lean cuts of meats and very little of meats high in sodium and nitrates such as pork and lunch meats. Discussed portion control for all food groups.      Comment  Patient has lost 2 lbs since his last 30 day review. He says he continues to eat a healthy diet. He continues to have a reduced appetite. Will continue to monitor for progress.     Expected Outcome  Patient will continue to meet his nutritional goals.        Psychosocial: Target Goals:  Acknowledge presence or absence of significant depression and/or stress, maximize coping skills, provide positive support system. Participant is able to verbalize types and ability to use techniques and skills needed for reducing stress and depression.  Initial Review & Psychosocial Screening: Initial Psych Review & Screening - 11/20/17 1134      Initial Review   Current issues with  None Identified      Family Dynamics   Good Support System?  Yes      Barriers   Psychosocial barriers to participate in program  There are no identifiable barriers or psychosocial needs.      Screening Interventions   Interventions  Encouraged to exercise    Expected Outcomes  Short Term goal: Identification and review with participant of any Quality of Life or Depression concerns found by scoring the questionnaire.;Long Term goal: The participant improves quality of Life and PHQ9 Scores as seen by post scores and/or verbalization of changes       Quality of Life Scores: Quality of Life - 11/20/17 1016      Quality of Life Scores   Health/Function Pre  14.23 %    Socioeconomic Pre  24.75 %    Psych/Spiritual Pre  21.36 %    Family Pre  22.5 %    GLOBAL Pre  18.83 %      Scores of 19 and below usually indicate a poorer quality of life in these areas.  A difference of  2-3 points is a clinically meaningful difference.  A difference of 2-3 points in the total score of the Quality of Life Index has been associated with significant improvement in overall quality of life, self-image, physical symptoms, and general health in studies assessing change in quality of life.  PHQ-9: Recent Review Flowsheet Data    Depression screen [facility] 2/9 11/20/2017   Decreased Interest 1   Down, Depressed, Hopeless 0   PHQ - 2 Score 1   Altered sleeping 0   Tired, decreased energy 2   Change in appetite 0   Feeling bad or failure about yourself  0   Trouble concentrating 0   Moving slowly or fidgety/restless 0    Suicidal thoughts 0   PHQ-9 Score 3   Difficult doing work/chores Not difficult at all     Interpretation of Total Score  Total Score Depression Severity:  1-4 = Minimal depression, 5-9 = Mild depression, 10-14 = Moderate depression, 15-19 = Moderately severe depression, 20-27 = Severe depression   Psychosocial Evaluation and Intervention: Psychosocial Evaluation - 11/20/17 1145      Psychosocial Evaluation & Interventions   Interventions  Encouraged to exercise with the program and follow exercise prescription    Continue Psychosocial Services   Follow up required by staff       Psychosocial Re-Evaluation: Psychosocial Re-Evaluation    [place] Name 12/19/17 1449 01/09/18 1543 01/31/18 1426 02/27/18 0913       Psychosocial Re-Evaluation   Current issues with  None Identified  None Identified  None Identified  None Identified    Comments  Patient's initial QOL score was 18.83 and his PHQ-9 score was 3 with no psychosocial issues identified.   Patient's initial QOL score was 18.83 and his PHQ-9 score was 3 with no psychosocial issues identified.   Patient's initial QOL score was 18.83 and his PHQ-9 score was 3 with no psychosocial issues identified.   Patient's initial QOL score was 18.83 and his PHQ-9 score was 3 with no psychosocial issues identified.     Expected Outcomes  Patient will have no psychosocial issues identified at discharge.   Patient will have no psychosocial issues identified at discharge.   Patient will have no psychosocial issues identified at discharge.   Patient will have no psychosocial issues identified at discharge.     Interventions  Relaxation education;Stress management education;Encouraged to attend Pulmonary Rehabilitation for the exercise  Relaxation education;Stress management education;Encouraged to attend Pulmonary Rehabilitation for the exercise  Relaxation education;Stress management education;Encouraged to attend Pulmonary Rehabilitation for the exercise   Relaxation education;Stress management education;Encouraged to attend Pulmonary Rehabilitation for the exercise    Continue Psychosocial Services   No Follow up required  No Follow up required  No Follow up required  No Follow up required       Psychosocial Discharge (Final Psychosocial Re-Evaluation): Psychosocial Re-Evaluation - 02/27/18 0913      Psychosocial Re-Evaluation   Current issues with  None Identified    Comments  Patient's initial QOL score was 18.83 and his PHQ-9 score was 3 with no psychosocial issues identified.     Expected Outcomes  Patient will have no psychosocial issues identified at discharge.     Interventions  Relaxation education;Stress management education;Encouraged to attend Pulmonary Rehabilitation for the exercise    Continue Psychosocial Services   No Follow up required        Education: Education Goals: Education classes will be provided on a weekly basis, covering required topics. Participant will state understanding/return demonstration of topics presented.  Learning Barriers/Preferences: Learning Barriers/Preferences - 11/20/17 1610      Learning Barriers/Preferences   Learning Barriers  None    Learning Preferences  Skilled Demonstration;Individual Instruction;Group Instruction       Education Topics: How Lungs Work and  Diseases: - Discuss the anatomy of the lungs and diseases that can affect the lungs, such as COPD.   Exercise: -Discuss the importance of exercise, FITT principles of exercise, normal and abnormal responses to exercise, and how to exercise safely.   PULMONARY REHAB OTHER RESPIRATORY from 02/13/2018 in [place]  Date  12/19/17  Educator  [place]  Instruction Review Code  2- Demonstrated Understanding      Environmental Irritants: -Discuss types of environmental irritants and how to limit exposure to environmental irritants.   Meds/Inhalers and oxygen: - Discuss respiratory medications, definition  of an inhaler and oxygen, and the proper way to use an inhaler and oxygen.   PULMONARY REHAB OTHER RESPIRATORY from 02/13/2018 in [place]  Date  01/09/18  Educator  DC      Energy Saving Techniques: - Discuss methods to conserve energy and decrease shortness of breath when performing activities of daily living.    PULMONARY REHAB OTHER RESPIRATORY from 02/13/2018 in [place]  Date  01/16/18  Educator  [place]  Instruction Review Code  2- Demonstrated Understanding      Bronchial Hygiene / Breathing Techniques: - Discuss breathing mechanics, pursed-lip breathing technique,  proper posture, effective ways to clear airways, and other functional breathing techniques   PULMONARY REHAB OTHER RESPIRATORY from 02/13/2018 in [place]  Date  01/23/18  Educator  DJ  Instruction Review Code  2- Demonstrated Understanding      Cleaning Equipment: - Provides group verbal and written instruction about the health risks of elevated stress, cause of high stress, and healthy ways to reduce stress.   Nutrition I: Fats: - Discuss the types of cholesterol, what cholesterol does to the body, and how cholesterol levels can be controlled.   PULMONARY REHAB OTHER RESPIRATORY from 02/13/2018 in [place]  Date  02/06/18  Educator  [place]  Instruction Review Code  2- Demonstrated Understanding      Nutrition II: Labels: -Discuss the different components of food labels and how to read food labels.   PULMONARY REHAB OTHER RESPIRATORY from 02/13/2018 in [place]  Date  02/13/18  Educator  D. Coad  Instruction Review Code  2- Demonstrated Understanding      Respiratory Infections: - Discuss the signs and symptoms of respiratory infections, ways to prevent respiratory infections, and the importance of seeking medical treatment when having a respiratory infection.   Stress I: Signs and  Symptoms: - Discuss the causes of stress, how stress may lead to anxiety and depression, and ways to limit stress.   PULMONARY REHAB OTHER RESPIRATORY from 02/13/2018 in [place]  Date  11/28/17  Educator  [place]  Instruction Review Code  2- Demonstrated Understanding      Stress II: Relaxation: -Discuss relaxation techniques to limit stress.   PULMONARY REHAB OTHER RESPIRATORY from 02/13/2018 in [place]  Date  12/05/17  Educator  [place]  Instruction Review Code  2- Demonstrated Understanding      Oxygen for Home/Travel: - Discuss how to prepare for travel when on oxygen and proper ways to transport and store oxygen to ensure safety.   Knowledge Questionnaire Score: Knowledge Questionnaire Score - 11/20/17 1019      Knowledge Questionnaire Score   Pre Score  11/18       Core Components/Risk Factors/Patient Goals at Admission: Personal Goals and Risk Factors at Admission - 11/20/17 1132      Core  Components/Risk Factors/Patient Goals on Admission    Weight Management  Weight Maintenance    Personal Goal Other  Yes    Personal Goal  Increase lung capacity, Strengthen legs, gain stability.     Intervention  Attend PR 2 x week and supplement at home exercise 3 x week.     Expected Outcomes  Reach goals.        Core Components/Risk Factors/Patient Goals Review:  Goals and Risk Factor Review    Row Name 12/19/17 1446 01/09/18 1539 01/31/18 1420 02/27/18 0909       Core Components/Risk Factors/Patient Goals Review   Personal Goals Review  Weight Management/Obesity Increase lung capacity; stengthen legs; gain stability.   Weight Management/Obesity Increase lung capacity; strengthen legs; gain stability.   Weight Management/Obesity Increase lung capacity; strengthen legs; gain stability.   Weight Management/Obesity;Improve shortness of breath with ADL's Increase lung capacity; strengthen legs; gain stability.    Review  Patient has  completed 8 sessions gaining 2 lbs which is needed since his BMI is 19.2. He says he was only able to take 10 to 12 steps without having to stop and rest before he started our program. He is now able to walk further and he is pleased with his progress so far. He hopes to gain more strength in his legs as he continues. Will continue to monitor for progress.   Patient has completed 12 sessions maintaining his weight since last 30 day review. He continues to say the program has helped him mainly walk better and he is able to walk longer distance. He can not tell a difference with his breathing yet but he feels that the program is helping him. Will continue to monitor for progress.    Patient has completed 17 sessions losing 3 lbs since last 30 day review. He continues to do well in the program with progression. He received his 6 month Lupron-Depro injection 01/24/18 which makes him have lower extremity weakness. He was not able to complete his session 6/11 and was not able to attend 6/13. He does feel the program is helping him, but treatment for his cancer impeded his progress. Will continue to monitor.   Patient has completed 22 sessions losing 2 lbs since last 30 day review. He continues to do well in the program with some progression. He continues treatment for prostate cancer which causes weakness at times. Inspite of this, he says he feels the program is helping him be able to walk further. He says the pursed lip breathing technique he learned here has done "wonders" for his SOB. He says this has helped him the most learning this technique. Will continue to monitor for progress.     Expected Outcomes  Patient will continue to attend sessions and complete the program meeting his personal goals.   Patient will continue to attend sessions and complete the program meeting his personal goals.   Patient will continue to attend sessions and complete the program meeting his personal goals.   Patient will continue to  attend sessions and complete the program meeting his personal goals.        Core Components/Risk Factors/Patient Goals at Discharge (Final Review):  Goals and Risk Factor Review - 02/27/18 0909      Core Components/Risk Factors/Patient Goals Review   Personal Goals Review  Weight Management/Obesity;Improve shortness of breath with ADL's Increase lung capacity; strengthen legs; gain stability.    Review  Patient has completed 22 sessions losing 2 lbs since last 30  day review. He continues to do well in the program with some progression. He continues treatment for prostate cancer which causes weakness at times. Inspite of this, he says he feels the program is helping him be able to walk further. He says the pursed lip breathing technique he learned here has done "wonders" for his SOB. He says this has helped him the most learning this technique. Will continue to monitor for progress.     Expected Outcomes  Patient will continue to attend sessions and complete the program meeting his personal goals.        ITP Comments: ITP Comments    Row Name 11/20/17 1129 11/21/17 1250         ITP Comments  Peter Lopez is a pleasant 82 year old gentleman who is getting ready to start. He will have several other doctors appointments due to having lung cancer and prostate cancer. He is eager to get started.   Patient new to program. Plans to start Tuesday 11/26/17. Will continue to monitor for progress.          Comments: ITP 30 Day REVIEW Pt is making expected progress toward pulmonary rehab goals after completing 22 sessions. Recommend continued exercise, life style modification, education, and utilization of breathing techniques to increase stamina and strength and decrease shortness of breath with exertion.

## 2018-03-04 ENCOUNTER — Encounter (HOSPITAL_COMMUNITY)
Admission: RE | Admit: 2018-03-04 | Discharge: 2018-03-04 | Disposition: A | Discharge status: Home / Self Care | Payer: PPO | Source: Ambulatory Visit | Attending: Pulmonary Disease | Admitting: Pulmonary Disease

## 2018-03-04 DIAGNOSIS — J449 Chronic obstructive pulmonary disease, unspecified: Secondary | ICD-10-CM | POA: Diagnosis not present

## 2018-03-04 NOTE — Progress Notes (Signed)
Daily Session Note  Patient Details  Name: Peter Lopez MRN: [id number] Date of Birth: 1935/10/23 Referring Provider:     PULMONARY REHAB COPD ORIENTATION from 11/20/2017 in [place]  Referring Provider  DR. Luan Pulling      Encounter Date: 03/04/2018  Check In: Session Check In - 03/04/18 1300      Check-In   Location  [facility]    Staff Present  Diane Angelina Pih, MS, EP, [facility], Exercise Physiologist;Kathyjo Briere Wynetta Emery, RN, BSN    Supervising physician immediately available to respond to emergencies  See telemetry face sheet for immediately available MD    Medication changes reported      No    Fall or balance concerns reported     Yes    Comments  Fallen 1 ime in 12 months. He also gets weak in his knees at times    Warm-up and Cool-down  Performed as group-led instruction    Resistance Training Performed  Yes    VAD Patient?  No    PAD/SET Patient?  No      Pain Assessment   Currently in Pain?  No/denies    Pain Score  0-No pain    Multiple Pain Sites  No       Capillary Blood Glucose: No results found for this or any previous visit (from the past 24 hour(s)).    Social History   Tobacco Use  Smoking Status Former Smoker  . Packs/day: 1.00  . Last attempt to quit: 12/11/1998  . Years since quitting: 19.2  Smokeless Tobacco Never Used    Goals Met:  Proper associated with RPD/PD & O2 Sat Independence with exercise equipment Improved SOB with ADL's Using PLB without cueing & demonstrates good technique Exercise tolerated well No report of cardiac concerns or symptoms Strength training completed today  Goals Unmet:  Not Applicable  Comments: Check out 1400.   Dr. Sinda Du is Medical Director for [facility] Pulmonary Rehab.

## 2018-03-06 ENCOUNTER — Encounter (HOSPITAL_COMMUNITY): Payer: PPO

## 2018-03-06 DIAGNOSIS — I517 Cardiomegaly: Secondary | ICD-10-CM | POA: Diagnosis not present

## 2018-03-06 DIAGNOSIS — J439 Emphysema, unspecified: Secondary | ICD-10-CM | POA: Diagnosis not present

## 2018-03-06 DIAGNOSIS — Z85118 Personal history of other malignant neoplasm of bronchus and lung: Secondary | ICD-10-CM | POA: Diagnosis not present

## 2018-03-06 DIAGNOSIS — I5189 Other ill-defined heart diseases: Secondary | ICD-10-CM | POA: Diagnosis not present

## 2018-03-06 DIAGNOSIS — Z8546 Personal history of malignant neoplasm of prostate: Secondary | ICD-10-CM | POA: Diagnosis not present

## 2018-03-06 DIAGNOSIS — I700 Atherosclerosis of aorta: Secondary | ICD-10-CM | POA: Diagnosis not present

## 2018-03-06 DIAGNOSIS — R918 Other nonspecific abnormal finding of lung field: Secondary | ICD-10-CM | POA: Diagnosis not present

## 2018-03-06 DIAGNOSIS — J90 Pleural effusion, not elsewhere classified: Secondary | ICD-10-CM | POA: Diagnosis not present

## 2018-03-11 ENCOUNTER — Encounter (HOSPITAL_COMMUNITY): Payer: PPO

## 2018-03-11 ENCOUNTER — Encounter: Payer: Self-pay | Admitting: Internal Medicine

## 2018-03-11 ENCOUNTER — Ambulatory Visit: Payer: PPO | Admitting: Internal Medicine

## 2018-03-11 VITALS — BP 142/82 | HR 73 | Ht 69.0 in | Wt 137.0 lb

## 2018-03-11 DIAGNOSIS — I482 Chronic atrial fibrillation, unspecified: Secondary | ICD-10-CM

## 2018-03-11 LAB — CUP PACEART INCLINIC DEVICE CHECK
Battery Remaining Longevity: 37 mo
Battery Voltage: 2.75 V
Implantable Lead Implant Date: 20140407
Implantable Lead Location: 753860
Implantable Pulse Generator Implant Date: 20140407
Lead Channel Impedance Value: 0 Ohm
Lead Channel Pacing Threshold Amplitude: 0.625 V
Lead Channel Pacing Threshold Amplitude: 0.75 V
Lead Channel Pacing Threshold Pulse Width: 0.4 ms
Lead Channel Setting Pacing Amplitude: 2.5 V
Lead Channel Setting Pacing Pulse Width: 0.4 ms
Lead Channel Setting Sensing Sensitivity: 2.8 mV
MDC IDC MSMT BATTERY IMPEDANCE: 1935 Ohm
MDC IDC MSMT LEADCHNL RV IMPEDANCE VALUE: 624 Ohm
MDC IDC MSMT LEADCHNL RV PACING THRESHOLD PULSEWIDTH: 0.4 ms
MDC IDC SESS DTM: 20190723093032
MDC IDC STAT BRADY RV PERCENT PACED: 98 %

## 2018-03-11 NOTE — Patient Instructions (Signed)
Medication Instructions:  Your physician recommends that you continue on your current medications as directed. Please refer to the Current Medication list given to you today.   Labwork: NONE   Testing/Procedures: NONE   Follow-Up: Your physician wants you to follow-up in:1 Year. You will receive a reminder letter in the mail two months in advance. If you don't receive a letter, please call our office to schedule the follow-up appointment.  Remote monitoring is used to monitor your Pacemaker of ICD from home. This monitoring reduces the number of office visits required to check your device to one time per year. It allows [place] to keep an eye on the functioning of your device to ensure it is working properly. You are scheduled for a device check from home on 04/24/18. You may send your transmission at any time that day. If you have a wireless device, the transmission will be sent automatically. After your physician reviews your transmission, you will receive a postcard with your next transmission date.    Any Other Special Instructions Will Be Listed Below (If Applicable).     If you need a refill on your cardiac medications before your next appointment, please call your pharmacy.  Thank you for choosing [place]!

## 2018-03-11 NOTE — Progress Notes (Signed)
HPI Peter Lopez returns today for followup. He is a very pleasant 82 year old man with symptomatic bradycardia due to CHB, status post permanent pacemaker insertion, chronic atrial fibrillation, and chronic anticoagulation. The patient noted that on systemic anticoagulation he had hemoptysis.  He denies peripheral edema, chest pain, or shortness of breath. He has been diagnosed with squamous cell CA of the lung and has undergone XRT.   Allergies  Allergen Reactions  . Rivaroxaban     Other reaction(s): Other (See Comments) Coughing up blood     Current Outpatient Medications  Medication Sig Dispense Refill  . acetaminophen (TYLENOL) 500 MG tablet Take 1,000 mg by mouth every 6 (six) hours as needed for moderate pain.    Marland Kitchen amLODipine-olmesartan (AZOR) 5-40 MG per tablet Take 1 tablet by mouth daily.    Marland Kitchen aspirin EC 81 MG tablet Take 81 mg by mouth daily.    . Calcium Citrate-Vitamin D (CALCIUM + D PO) Take 1,200 mg by mouth daily.    . enzalutamide (XTANDI) 40 MG capsule Take 120 mg by mouth daily.     . folic acid (FOLVITE) 1 MG tablet Take 1 mg by mouth daily.    Marland Kitchen Leuprolide Acetate, 6 Month, (LUPRON DEPOT, 59-MONTH,) 45 MG injection Inject 45 mg into the muscle every 6 (six) months.    . methotrexate (RHEUMATREX) 2.5 MG tablet Take 2.5 mg by mouth once a week. sunday    . potassium chloride SA (K-DUR,KLOR-CON) 20 MEQ tablet take 2 tablets by mouth once daily 60 tablet 6  . propranolol (INDERAL) 40 MG tablet Take 40 mg by mouth daily.     . simvastatin (ZOCOR) 40 MG tablet Take 40 mg by mouth daily.      Marland Kitchen torsemide (DEMADEX) 20 MG tablet Take 20 mg by mouth daily.      No current facility-administered medications for this visit.      Past Medical History:  Diagnosis Date  . 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 Benign essential tremor   . Exertional dyspnea   . Hyperlipidemia   . Hypertension   . Pacemaker 11/24/2012   Dr Lovena Le  . Prostate cancer ([place])    S/P  Prostatectomy  . Tobacco abuse    30 pack years; discontinued in 2000; currently one pack per week    ROS:   All systems reviewed and negative except as noted in the HPI.   Past Surgical History:  Procedure Laterality Date  . APPENDECTOMY    . CARDIAC CATHETERIZATION N/A 03/01/2016   Procedure: Right Heart Cath;  Surgeon: Troy Sine, MD;  Location: [place] [facility];  Service: Cardiovascular;  Laterality: N/A;  . CATARACT EXTRACTION W/PHACO Left 05/03/2015   Procedure: CATARACT EXTRACTION PHACO AND INTRAOCULAR LENS PLACEMENT LEFT EYE;  Surgeon: Rutherford Guys, MD;  Location: [facility];  Service: Ophthalmology;  Laterality: Left;  CDE:8.03  . COLONOSCOPY  2011  . HERNIA REPAIR    . INSERT / REPLACE / REMOVE PACEMAKER  11/24/2012   Dr Cristopher Peru  . KNEE SURGERY     Left laparoscopic knee surgery  . MENISCECTOMY     Right knee open meniscectomy  . PERMANENT PACEMAKER INSERTION N/A 11/24/2012   Procedure: PERMANENT PACEMAKER INSERTION;  Surgeon: Evans Lance, MD;  Location: [facility] CATH LAB;  Service: Cardiovascular;  Laterality: N/A;  . ROBOT ASSISTED LAPAROSCOPIC RADICAL PROSTATECTOMY       Family History  Problem Relation Age of Onset  .  Alzheimer's disease Mother 46  . Early death Father 67       trauma  . Heart failure Brother      Social History   Socioeconomic History  . Marital status: Married    Spouse name: Not on file  . Number of children: Not on file  . Years of education: Not on file  . Highest education level: Not on file  Occupational History  . Occupation: Runner, broadcasting/film/video  Social Needs  . Financial resource strain: Not on file  . Food insecurity:    Worry: Not on file    Inability: Not on file  . Transportation needs:    Medical: Not on file    Non-medical: Not on file  Tobacco Use  . Smoking status: Former Smoker    Packs/day: 1.00    Last attempt to quit: 12/11/1998    Years since quitting: 19.2  . Smokeless tobacco: Never Used  Substance and  Sexual Activity  . Alcohol use: Yes    Alcohol/week: 0.0 oz    Comment: modest  . Drug use: No  . Sexual activity: Not on file  Lifestyle  . Physical activity:    Days per week: Not on file    Minutes per session: Not on file  . Stress: Not on file  Relationships  . Social connections:    Talks on phone: Not on file    Gets together: Not on file    Attends religious service: Not on file    Active member of club or organization: Not on file    Attends meetings of clubs or organizations: Not on file    Relationship status: Not on file  . Intimate partner violence:    Fear of current or ex partner: Not on file    Emotionally abused: Not on file    Physically abused: Not on file    Forced sexual activity: Not on file  Other Topics Concern  . Not on file  Social History Narrative  . Not on file     BP (!) 142/82 (BP Location: Left Arm)   Pulse 73   Ht 5\' 9"  (1.753 m)   Wt 137 lb (62.1 kg)   SpO2 94%   BMI 20.23 kg/m   Physical Exam:  stable appearing 82 yo man, NAD HEENT: Unremarkable Neck:  6 cm JVD, no thyromegally Lymphatics:  No adenopathy Back:  No CVA tenderness Lungs:  Clear with wheezes HEART:  Regular rate rhythm, no murmurs, no rubs, no clicks Abd:  soft, positive bowel sounds, no organomegally, no rebound, no guarding Ext:  2 plus pulses, no edema, no cyanosis, no clubbing Skin:  No rashes no nodules Neuro:  CN II through XII intact, motor grossly intact  EKG - atrial fib with a RVR  DEVICE  Normal device function.  See PaceArt for details.   Assess/Plan: 1. Atrial fib - his ventricular rate is well controlled.  2. HTN- his pressures are a little high today. I expect that his pressures will start to go down and if so we would reduce his anti-htn meds. 3. PPM - his medtronic single chamber PM is working normally. 4. Hemoptysis - none further. He is unable to take systemic anti-coagulation due to this.   Mikle Bosworth.D.

## 2018-03-12 LAB — CUP PACEART REMOTE DEVICE CHECK
Battery Voltage: 2.75 V
Date Time Interrogation Session: 20190606090606
Implantable Lead Location: 753860
Implantable Lead Model: 5076
Lead Channel Impedance Value: 0 Ohm
Lead Channel Impedance Value: 640 Ohm
Lead Channel Pacing Threshold Amplitude: 0.75 V
Lead Channel Pacing Threshold Pulse Width: 0.4 ms
Lead Channel Setting Pacing Amplitude: 2.5 V
MDC IDC LEAD IMPLANT DT: 20140407
MDC IDC MSMT BATTERY IMPEDANCE: 1906 Ohm
MDC IDC MSMT BATTERY REMAINING LONGEVITY: 37 mo
MDC IDC PG IMPLANT DT: 20140407
MDC IDC SET LEADCHNL RV PACING PULSEWIDTH: 0.4 ms
MDC IDC SET LEADCHNL RV SENSING SENSITIVITY: 2.8 mV
MDC IDC STAT BRADY RV PERCENT PACED: 99 %

## 2018-03-13 ENCOUNTER — Encounter (HOSPITAL_COMMUNITY): Payer: PPO

## 2018-03-17 DIAGNOSIS — M9902 Segmental and somatic dysfunction of thoracic region: Secondary | ICD-10-CM | POA: Diagnosis not present

## 2018-03-17 DIAGNOSIS — M25572 Pain in left ankle and joints of left foot: Secondary | ICD-10-CM | POA: Diagnosis not present

## 2018-03-17 DIAGNOSIS — M545 Low back pain: Secondary | ICD-10-CM | POA: Diagnosis not present

## 2018-03-17 DIAGNOSIS — M9901 Segmental and somatic dysfunction of cervical region: Secondary | ICD-10-CM | POA: Diagnosis not present

## 2018-03-17 DIAGNOSIS — M9903 Segmental and somatic dysfunction of lumbar region: Secondary | ICD-10-CM | POA: Diagnosis not present

## 2018-03-18 ENCOUNTER — Encounter (HOSPITAL_COMMUNITY): Payer: PPO

## 2018-03-18 DIAGNOSIS — R2681 Unsteadiness on feet: Secondary | ICD-10-CM | POA: Diagnosis not present

## 2018-03-18 DIAGNOSIS — E860 Dehydration: Secondary | ICD-10-CM | POA: Diagnosis not present

## 2018-03-18 DIAGNOSIS — W19XXXA Unspecified fall, initial encounter: Secondary | ICD-10-CM | POA: Diagnosis not present

## 2018-03-18 DIAGNOSIS — Z888 Allergy status to other drugs, medicaments and biological substances status: Secondary | ICD-10-CM | POA: Diagnosis not present

## 2018-03-18 DIAGNOSIS — R251 Tremor, unspecified: Secondary | ICD-10-CM | POA: Diagnosis not present

## 2018-03-18 DIAGNOSIS — G4700 Insomnia, unspecified: Secondary | ICD-10-CM | POA: Diagnosis not present

## 2018-03-18 DIAGNOSIS — S199XXA Unspecified injury of neck, initial encounter: Secondary | ICD-10-CM | POA: Diagnosis not present

## 2018-03-18 DIAGNOSIS — M4856XD Collapsed vertebra, not elsewhere classified, lumbar region, subsequent encounter for fracture with routine healing: Secondary | ICD-10-CM | POA: Diagnosis not present

## 2018-03-18 DIAGNOSIS — M542 Cervicalgia: Secondary | ICD-10-CM | POA: Diagnosis not present

## 2018-03-18 DIAGNOSIS — Z950 Presence of cardiac pacemaker: Secondary | ICD-10-CM | POA: Diagnosis not present

## 2018-03-18 DIAGNOSIS — C3431 Malignant neoplasm of lower lobe, right bronchus or lung: Secondary | ICD-10-CM | POA: Diagnosis not present

## 2018-03-18 DIAGNOSIS — Z923 Personal history of irradiation: Secondary | ICD-10-CM | POA: Diagnosis not present

## 2018-03-18 DIAGNOSIS — R17 Unspecified jaundice: Secondary | ICD-10-CM | POA: Diagnosis not present

## 2018-03-18 DIAGNOSIS — S80212A Abrasion, left knee, initial encounter: Secondary | ICD-10-CM | POA: Diagnosis not present

## 2018-03-18 DIAGNOSIS — S32010A Wedge compression fracture of first lumbar vertebra, initial encounter for closed fracture: Secondary | ICD-10-CM | POA: Diagnosis not present

## 2018-03-18 DIAGNOSIS — S9032XA Contusion of left foot, initial encounter: Secondary | ICD-10-CM | POA: Diagnosis not present

## 2018-03-18 DIAGNOSIS — R5381 Other malaise: Secondary | ICD-10-CM | POA: Diagnosis not present

## 2018-03-18 DIAGNOSIS — C61 Malignant neoplasm of prostate: Secondary | ICD-10-CM | POA: Diagnosis not present

## 2018-03-18 DIAGNOSIS — M5187 Other intervertebral disc disorders, lumbosacral region: Secondary | ICD-10-CM | POA: Diagnosis not present

## 2018-03-19 ENCOUNTER — Encounter: Payer: Self-pay | Admitting: Orthopaedic Surgery

## 2018-03-19 ENCOUNTER — Telehealth: Payer: Self-pay | Admitting: Orthopedic Surgery

## 2018-03-19 ENCOUNTER — Ambulatory Visit: Payer: PPO | Admitting: Orthopaedic Surgery

## 2018-03-19 VITALS — BP 120/73 | HR 76 | Ht 66.0 in | Wt 138.0 lb

## 2018-03-19 DIAGNOSIS — C61 Malignant neoplasm of prostate: Secondary | ICD-10-CM

## 2018-03-19 DIAGNOSIS — S32020A Wedge compression fracture of second lumbar vertebra, initial encounter for closed fracture: Secondary | ICD-10-CM

## 2018-03-19 NOTE — Progress Notes (Signed)
Subjective:    Patient ID: Peter Lopez, male    DOB: 09/14/1935, 82 y.o.   MRN: [id number]  HPI He twisted and fell to his knees recently. He has had increased lower back pain.  He has also been treated for cancer of the prostate He got a CT scan yesterday showing a new compression fracture of L2 with about 30% loss of height.  He has no neurological compromise.  He is in pain.  He normally uses a cane.  He has deep pain.  I have explained the findings to him and have recommended a CASH brace which was provided and fitted to him today.  He is to wear the brace when he is up and sitting and walking.  He may remove to bathe and to sleep.   Review of Systems  Constitutional: Positive for activity change.  Musculoskeletal: Positive for arthralgias and gait problem.  All other systems reviewed and are negative.  For Review of Systems, all other systems reviewed and are negative.  Past Medical History:  Diagnosis Date  . Arthritis   . 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 Benign essential tremor   . Exertional dyspnea   . Heart disease   . Hyperlipidemia   . Hypertension   . Pacemaker 11/24/2012   Dr Lovena Le  . Prostate cancer ([place])    S/P Prostatectomy  . Tobacco abuse    30 pack years; discontinued in 2000; currently one pack per week    Past Surgical History:  Procedure Laterality Date  . APPENDECTOMY    . CARDIAC CATHETERIZATION N/A 03/01/2016   Procedure: Right Heart Cath;  Surgeon: Troy Sine, MD;  Location: [place] [facility];  Service: Cardiovascular;  Laterality: N/A;  . CATARACT EXTRACTION W/PHACO Left 05/03/2015   Procedure: CATARACT EXTRACTION PHACO AND INTRAOCULAR LENS PLACEMENT LEFT EYE;  Surgeon: Rutherford Guys, MD;  Location: [facility];  Service: Ophthalmology;  Laterality: Left;  CDE:8.03  . COLONOSCOPY  2011  . HERNIA REPAIR    . INSERT / REPLACE / REMOVE PACEMAKER  11/24/2012   Dr Cristopher Peru  . KNEE SURGERY     Left laparoscopic knee surgery  .  MENISCECTOMY     Right knee open meniscectomy  . PERMANENT PACEMAKER INSERTION N/A 11/24/2012   Procedure: PERMANENT PACEMAKER INSERTION;  Surgeon: Evans Lance, MD;  Location: [facility] CATH LAB;  Service: Cardiovascular;  Laterality: N/A;  . ROBOT ASSISTED LAPAROSCOPIC RADICAL PROSTATECTOMY      Current Outpatient Medications on File Prior to Visit  Medication Sig Dispense Refill  . acetaminophen (TYLENOL) 500 MG tablet Take 1,000 mg by mouth every 6 (six) hours as needed for moderate pain.    Marland Kitchen amLODipine-olmesartan (AZOR) 5-40 MG per tablet Take 1 tablet by mouth daily.    Marland Kitchen aspirin EC 81 MG tablet Take 81 mg by mouth daily.    . Calcium Citrate-Vitamin D (CALCIUM + D PO) Take 1,200 mg by mouth daily.    . enzalutamide (XTANDI) 40 MG capsule Take 120 mg by mouth daily.     . folic acid (FOLVITE) 1 MG tablet Take 1 mg by mouth daily.    Marland Kitchen Leuprolide Acetate, 6 Month, (LUPRON DEPOT, 68-MONTH,) 45 MG injection Inject 45 mg into the muscle every 6 (six) months.    . methotrexate (RHEUMATREX) 2.5 MG tablet Take 2.5 mg by mouth once a week. sunday    . potassium chloride SA (K-DUR,KLOR-CON) 20 MEQ tablet take 2 tablets  by mouth once daily 60 tablet 6  . propranolol (INDERAL) 40 MG tablet Take 40 mg by mouth daily.     . simvastatin (ZOCOR) 40 MG tablet Take 40 mg by mouth daily.      Marland Kitchen torsemide (DEMADEX) 20 MG tablet Take 20 mg by mouth daily.      No current facility-administered medications on file prior to visit.     Social History   Socioeconomic History  . Marital status: Married    Spouse name: Not on file  . Number of children: Not on file  . Years of education: Not on file  . Highest education level: Not on file  Occupational History  . Occupation: Runner, broadcasting/film/video  Social Needs  . Financial resource strain: Not on file  . Food insecurity:    Worry: Not on file    Inability: Not on file  . Transportation needs:    Medical: Not on file    Non-medical: Not on file  Tobacco  Use  . Smoking status: Former Smoker    Packs/day: 1.00    Last attempt to quit: 12/11/1998    Years since quitting: 19.2  . Smokeless tobacco: Never Used  Substance and Sexual Activity  . Alcohol use: Yes    Alcohol/week: 0.0 oz    Comment: modest  . Drug use: No  . Sexual activity: Not on file  Lifestyle  . Physical activity:    Days per week: Not on file    Minutes per session: Not on file  . Stress: Not on file  Relationships  . Social connections:    Talks on phone: Not on file    Gets together: Not on file    Attends religious service: Not on file    Active member of club or organization: Not on file    Attends meetings of clubs or organizations: Not on file    Relationship status: Not on file  . Intimate partner violence:    Fear of current or ex partner: Not on file    Emotionally abused: Not on file    Physically abused: Not on file    Forced sexual activity: Not on file  Other Topics Concern  . Not on file  Social History Narrative  . Not on file    Family History  Problem Relation Age of Onset  . Alzheimer's disease Mother 50  . Early death Father 33       trauma  . Heart failure Brother     BP 120/73   Pulse 76   Ht 5\' 6"  (1.676 m)   Wt 138 lb (62.6 kg)   BMI 22.27 kg/m   Body mass index is 22.27 kg/m.     Objective:   Physical Exam  Constitutional: He is oriented to person, place, and time. He appears well-developed and well-nourished.  HENT:  Head: Normocephalic and atraumatic.  Eyes: Pupils are equal, round, and reactive to light. Conjunctivae and EOM are normal.  Neck: Normal range of motion. Neck supple.  Cardiovascular: Normal rate, regular rhythm and intact distal pulses.  Pulmonary/Chest: Effort normal.  Abdominal: Soft.  Musculoskeletal:       Back:  Neurological: He is alert and oriented to person, place, and time. He has normal reflexes. He displays normal reflexes. No cranial nerve deficit. He exhibits normal muscle tone.  Coordination normal.  Skin: Skin is warm and dry.  Psychiatric: He has a normal mood and affect. His behavior is normal. Judgment and thought  content normal.     I have reviewed his CT scan reports and documents from [facility], [facility].     Assessment & Plan:   Encounter Diagnoses  Name Primary?  . Closed compression fracture of second lumbar vertebra, initial encounter ([place]) Yes  . ADENOCARCINOMA, PROSTATE    A CASH brace was provided and fitted.  Return in one week.  X-rays on return.  Call if any problem.  Precautions discussed.   Electronically Signed Sanjuana Kava, MD 7/31/20192:37 PM

## 2018-03-19 NOTE — Telephone Encounter (Signed)
Patient called this morning for an appointment with Dr. Aline Brochure for a possible fracture. I explained to him that Dr. Aline Brochure is out of the office and Dr. Luna Glasgow is seeing his patients until he returns. I asked him how did he know he may have a fracture. He stated he didn't know if he had an xray or not, but he was seen at [facility].

## 2018-03-20 ENCOUNTER — Encounter (HOSPITAL_COMMUNITY): Payer: PPO

## 2018-03-25 ENCOUNTER — Encounter (HOSPITAL_COMMUNITY): Payer: PPO

## 2018-03-26 ENCOUNTER — Ambulatory Visit (INDEPENDENT_AMBULATORY_CARE_PROVIDER_SITE_OTHER): Payer: PPO | Admitting: Orthopaedic Surgery

## 2018-03-26 ENCOUNTER — Encounter: Payer: Self-pay | Admitting: Orthopaedic Surgery

## 2018-03-26 ENCOUNTER — Ambulatory Visit (INDEPENDENT_AMBULATORY_CARE_PROVIDER_SITE_OTHER): Payer: PPO

## 2018-03-26 DIAGNOSIS — S32020D Wedge compression fracture of second lumbar vertebra, subsequent encounter for fracture with routine healing: Secondary | ICD-10-CM | POA: Diagnosis not present

## 2018-03-26 DIAGNOSIS — F17210 Nicotine dependence, cigarettes, uncomplicated: Secondary | ICD-10-CM

## 2018-03-26 NOTE — Progress Notes (Signed)
CC:  I still hurt.  I don't like the brace.  He has the CASH brace which was adjusted to fit him better.  NV intact. He has back pain.  X-rays were done of the lateral lumbar spine, reported separately.  Encounter Diagnoses  Name Primary?  . Closed compression fracture of L2 lumbar vertebra, with routine healing, subsequent encounter Yes  . Cigarette nicotine dependence without complication    Return in one month.  Wear the brace except when laying down or bathing.  Call if any problem.  X-rays on return.  Precautions discussed.   Electronically Signed Sanjuana Kava, MD 8/7/20192:45 PM

## 2018-03-26 NOTE — Patient Instructions (Signed)
Steps to Quit Smoking Smoking tobacco can be bad for your health. It can also affect almost every organ in your body. Smoking puts you and people around you at risk for many serious long-lasting (chronic) diseases. Quitting smoking is hard, but it is one of the best things that you can do for your health. It is never too late to quit. What are the benefits of quitting smoking? When you quit smoking, you lower your risk for getting serious diseases and conditions. They can include:  Lung cancer or lung disease.  Heart disease.  Stroke.  Heart attack.  Not being able to have children (infertility).  Weak bones (osteoporosis) and broken bones (fractures).  If you have coughing, wheezing, and shortness of breath, those symptoms may get better when you quit. You may also get sick less often. If you are pregnant, quitting smoking can help to lower your chances of having a baby of low birth weight. What can I do to help me quit smoking? Talk with your doctor about what can help you quit smoking. Some things you can do (strategies) include:  Quitting smoking totally, instead of slowly cutting back how much you smoke over a period of time.  Going to in-person counseling. You are more likely to quit if you go to many counseling sessions.  Using resources and support systems, such as: ? Online chats with a counselor. ? Phone quitlines. ? Printed self-help materials. ? Support groups or group counseling. ? Text messaging programs. ? Mobile phone apps or applications.  Taking medicines. Some of these medicines may have nicotine in them. If you are pregnant or breastfeeding, do not take any medicines to quit smoking unless your doctor says it is okay. Talk with your doctor about counseling or other things that can help you.  Talk with your doctor about using more than one strategy at the same time, such as taking medicines while you are also going to in-person counseling. This can help make  quitting easier. What things can I do to make it easier to quit? Quitting smoking might feel very hard at first, but there is a lot that you can do to make it easier. Take these steps:  Talk to your family and friends. Ask them to support and encourage you.  Call phone quitlines, reach out to support groups, or work with a counselor.  Ask people who smoke to not smoke around you.  Avoid places that make you want (trigger) to smoke, such as: ? Bars. ? Parties. ? Smoke-break areas at work.  Spend time with people who do not smoke.  Lower the stress in your life. Stress can make you want to smoke. Try these things to help your stress: ? Getting regular exercise. ? Deep-breathing exercises. ? Yoga. ? Meditating. ? Doing a body scan. To do this, close your eyes, focus on one area of your body at a time from head to toe, and notice which parts of your body are tense. Try to relax the muscles in those areas.  Download or buy apps on your mobile phone or tablet that can help you stick to your quit plan. There are many free apps, such as QuitGuide from the CDC (Centers for Disease Control and Prevention). You can find more support from smokefree.gov and other websites.  This information is not intended to replace advice given to you by your health care provider. Make sure you discuss any questions you have with your health care provider. Document Released: 06/02/2009 Document   Revised: 04/03/2016 Document Reviewed: 12/21/2014 Elsevier Interactive Patient Education  2018 [employer]  

## 2018-03-27 ENCOUNTER — Encounter (HOSPITAL_COMMUNITY): Payer: PPO

## 2018-03-27 NOTE — Progress Notes (Addendum)
Pulmonary Individual Treatment Plan  Patient Details  Name: Peter Lopez MRN: [id number] Date of Birth: 1936/07/25 Referring Provider:     PULMONARY REHAB COPD ORIENTATION from 11/20/2017 in [place]  Referring Provider  DR. Hawkins      Initial Encounter Date:    PULMONARY REHAB COPD ORIENTATION from 11/20/2017 in [place]  Date  11/20/17      Visit Diagnosis: Chronic obstructive pulmonary disease, unspecified COPD type ([place])  Patient's Home Medications on Admission:   Current Outpatient Medications:  .  acetaminophen (TYLENOL) 500 MG tablet, Take 1,000 mg by mouth every 6 (six) hours as needed for moderate pain., Disp: , Rfl:  .  amLODipine-olmesartan (AZOR) 5-40 MG per tablet, Take 1 tablet by mouth daily., Disp: , Rfl:  .  aspirin EC 81 MG tablet, Take 81 mg by mouth daily., Disp: , Rfl:  .  Calcium Citrate-Vitamin D (CALCIUM + D PO), Take 1,200 mg by mouth daily., Disp: , Rfl:  .  enzalutamide (XTANDI) 40 MG capsule, Take 120 mg by mouth daily. , Disp: , Rfl:  .  folic acid (FOLVITE) 1 MG tablet, Take 1 mg by mouth daily., Disp: , Rfl:  .  Leuprolide Acetate, 6 Month, (LUPRON DEPOT, 35-MONTH,) 45 MG injection, Inject 45 mg into the muscle every 6 (six) months., Disp: , Rfl:  .  methotrexate (RHEUMATREX) 2.5 MG tablet, Take 2.5 mg by mouth once a week. sunday, Disp: , Rfl:  .  potassium chloride SA (K-DUR,KLOR-CON) 20 MEQ tablet, take 2 tablets by mouth once daily, Disp: 60 tablet, Rfl: 6 .  propranolol (INDERAL) 40 MG tablet, Take 40 mg by mouth daily. , Disp: , Rfl:  .  simvastatin (ZOCOR) 40 MG tablet, Take 40 mg by mouth daily.  , Disp: , Rfl:  .  torsemide (DEMADEX) 20 MG tablet, Take 20 mg by mouth daily. , Disp: , Rfl:   Past Medical History: Past Medical History:  Diagnosis Date  . Arthritis   . 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 Benign essential tremor   . Exertional dyspnea   . Heart disease   .  Hyperlipidemia   . Hypertension   . Pacemaker 11/24/2012   Dr Lovena Le  . Prostate cancer ([place])    S/P Prostatectomy  . Tobacco abuse    30 pack years; discontinued in 2000; currently one pack per week    Tobacco Use: Social History   Tobacco Use  Smoking Status Former Smoker  . Packs/day: 1.00  . Last attempt to quit: 12/11/1998  . Years since quitting: 19.3  Smokeless Tobacco Never Used    Labs: Recent Review Flowsheet Data    Labs for ITP Cardiac and Pulmonary Rehab Latest Ref Rng & Units 05/03/2010 07/22/2010 12/07/2011 03/01/2016   Cholestrol 0 - 200 mg/dL - 159 163 -   LDLCALC 0 - 99 mg/dL - 89 86 -   HDL >39 mg/dL - 58 57 -   Trlycerides <150 mg/dL - 60 100 -   PHART 7.350 - 7.450 7.428 - - -   PCO2ART 35.0 - 45.0 mmHg 36.2 - - -   HCO3 20.0 - 24.0 mEq/L 23.5 - - 27.9(H)   TCO2 0 - 100 mmol/L 20.2 - - 29   O2SAT % 96.4 - - 62.0      Capillary Blood Glucose: No results found for: GLUCAP   Pulmonary Assessment Scores: Pulmonary Assessment Scores    Row Name 11/20/17 1122  ADL UCSD   ADL Phase  Entry     SOB Score total  31     Rest  0     Walk  7     Stairs  3     Bath  1     Dress  1     Shop  0       CAT Score   CAT Score  14       mMRC Score   mMRC Score  2        Pulmonary Function Assessment: Pulmonary Function Assessment - 11/20/17 1021      Pulmonary Function Tests   FVC%  3.86 %    FEV1%  2.74 %    FEV1/FVC Ratio  72    RV%  2.6 %    DLCO%  30.99 %      Initial Spirometry Results   FVC%  3.86 %    FEV1%  2.74 %    FEV1/FVC Ratio  72      Post Bronchodilator Spirometry Results   FVC%  94 %    FEV1%  70 %    FEV1/FVC Ratio  73    Comments  Actual FEV/FVC = 53      Breath   Bilateral Breath Sounds  Clear    Shortness of Breath  --   with exertion      Exercise Target Goals:    Exercise Program Goal: Individual exercise prescription set using results from initial 6 min walk test and THRR while considering   patient's activity barriers and safety.   Exercise Prescription Goal: Initial exercise prescription builds to 30-45 minutes a day of aerobic activity, 2-3 days per week.  Home exercise guidelines will be given to patient during program as part of exercise prescription that the participant will acknowledge.  Activity Barriers & Risk Stratification: Activity Barriers & Cardiac Risk Stratification - 11/20/17 1012      Activity Barriers & Cardiac Risk Stratification   Activity Barriers  Other (comment);Joint Problems;Balance Concerns    Comments  Tremors    Cardiac Risk Stratification  High       6 Minute Walk: 6 Minute Walk    Row Name 11/20/17 1011         6 Minute Walk   Phase  Initial     Distance  750 feet     Distance % Change  0 %     Distance Feet Change  0 ft     Walk Time  6 minutes     # of Rest Breaks  1     MPH  1.42     METS  2.08     RPE  12     Perceived Dyspnea   13     VO2 Peak  7.56     Symptoms  No     Resting HR  70 bpm     Resting BP  126/70     Resting Oxygen Saturation   97 %     Exercise Oxygen Saturation  during 6 min walk  89 %     Max Ex. HR  104 bpm     Max Ex. BP  160/74     2 Minute Post BP  130/70        Oxygen Initial Assessment: Oxygen Initial Assessment - 11/20/17 1130      Home Oxygen   Home Oxygen Device  None    Sleep Oxygen Prescription  None    Home Exercise Oxygen Prescription  None    Home at Rest Exercise Oxygen Prescription  None      Program Oxygen Prescription   Program Oxygen Prescription  None       Oxygen Re-Evaluation: Oxygen Re-Evaluation    Row Name 12/19/17 1443 01/09/18 1538 01/31/18 1419 02/27/18 0906       Program Oxygen Prescription   Program Oxygen Prescription  None  None  None  None      Home Oxygen   Home Oxygen Device  None  None  None  None    Sleep Oxygen Prescription  None  None  None  None    Home Exercise Oxygen Prescription  None  None  None  None    Home at Rest Exercise Oxygen  Prescription  None  None  None  None      Goals/Expected Outcomes   Short Term Goals  To learn and demonstrate proper pursed lip breathing techniques or other breathing techniques.;To learn and understand importance of maintaining oxygen saturations>88%;To learn and understand importance of monitoring SPO2 with pulse oximeter and demonstrate accurate use of the pulse oximeter.  To learn and demonstrate proper pursed lip breathing techniques or other breathing techniques.;To learn and understand importance of maintaining oxygen saturations>88%;To learn and understand importance of monitoring SPO2 with pulse oximeter and demonstrate accurate use of the pulse oximeter.  To learn and demonstrate proper pursed lip breathing techniques or other breathing techniques.;To learn and understand importance of maintaining oxygen saturations>88%;To learn and understand importance of monitoring SPO2 with pulse oximeter and demonstrate accurate use of the pulse oximeter.  To learn and demonstrate proper pursed lip breathing techniques or other breathing techniques.;To learn and understand importance of maintaining oxygen saturations>88%;To learn and understand importance of monitoring SPO2 with pulse oximeter and demonstrate accurate use of the pulse oximeter.    Long  Term Goals  Exhibits proper breathing techniques, such as pursed lip breathing or other method taught during program session;Maintenance of O2 saturations>88%;Verbalizes importance of monitoring SPO2 with pulse oximeter and return demonstration  -  Exhibits proper breathing techniques, such as pursed lip breathing or other method taught during program session;Maintenance of O2 saturations>88%;Verbalizes importance of monitoring SPO2 with pulse oximeter and return demonstration  Exhibits proper breathing techniques, such as pursed lip breathing or other method taught during program session;Maintenance of O2 saturations>88%;Verbalizes importance of monitoring  SPO2 with pulse oximeter and return demonstration    Comments  Patient is able to demonstrate proper usage of pulse oximeter and pursed lip breathing during exercise. He also is able to verbalize the importance of maintaining his O2 sat level >88%.  Patient is able to demonstrate proper usage of pulse oximeter and pursed lip breathing during exercise. He also is able to verbalize the importance of maintaining his O2 sat level >88%.  Patient is able to demonstrate proper usage of pulse oximeter and pursed lip breathing during exercise. He also is able to verbalize the importance of maintaining his O2 sat level >88%.  Patient is able to demonstrate proper usage of pulse oximeter and pursed lip breathing during exercise. He also is able to verbalize the importance of maintaining his O2 sat level >88%.    Goals/Expected Outcomes  Patient will continue to meet his short and long term goals.   Patient will continue to meet his short and long term goals.   Patient will continue to meet his short and long term goals.   Patient will continue  to meet his short and long term goals.        Oxygen Discharge (Final Oxygen Re-Evaluation): Oxygen Re-Evaluation - 02/27/18 0906      Program Oxygen Prescription   Program Oxygen Prescription  None      Home Oxygen   Home Oxygen Device  None    Sleep Oxygen Prescription  None    Home Exercise Oxygen Prescription  None    Home at Rest Exercise Oxygen Prescription  None      Goals/Expected Outcomes   Short Term Goals  To learn and demonstrate proper pursed lip breathing techniques or other breathing techniques.;To learn and understand importance of maintaining oxygen saturations>88%;To learn and understand importance of monitoring SPO2 with pulse oximeter and demonstrate accurate use of the pulse oximeter.    Long  Term Goals  Exhibits proper breathing techniques, such as pursed lip breathing or other method taught during program session;Maintenance of O2  saturations>88%;Verbalizes importance of monitoring SPO2 with pulse oximeter and return demonstration    Comments  Patient is able to demonstrate proper usage of pulse oximeter and pursed lip breathing during exercise. He also is able to verbalize the importance of maintaining his O2 sat level >88%.    Goals/Expected Outcomes  Patient will continue to meet his short and long term goals.        Initial Exercise Prescription: Initial Exercise Prescription - 11/20/17 1000      Date of Initial Exercise RX and Referring Provider   Date  11/20/17    Referring Provider  DR. Hawkins      Treadmill   MPH  1    Grade  0    Minutes  15    METs  1.7      NuStep   Level  1    SPM  46    Minutes  20    METs  1.8      Prescription Details   Frequency (times per week)  3    Duration  Progress to 30 minutes of continuous aerobic without signs/symptoms of physical distress      Intensity   THRR 40-80% of Max Heartrate  [phone]    Ratings of Perceived Exertion  11-13    Perceived Dyspnea  0-4      Progression   Progression  Continue progressive overload as per policy without signs/symptoms or physical distress.      Resistance Training   Training Prescription  Yes    Weight  1    Reps  10-15       Perform Capillary Blood Glucose checks as needed.  Exercise Prescription Changes:  Exercise Prescription Changes    Row Name 12/12/17 0800 12/24/17 1000 01/06/18 1500 01/20/18 1400 02/05/18 1400     Response to Exercise   Blood Pressure (Admit)  140/70  136/62  150/70  150/72  142/72   Blood Pressure (Exercise)  150/74  146/74  160/80  158/76  160/80   Blood Pressure (Exit)  140/70  140/66  142/70  150/70  160/90   Heart Rate (Admit)  66 bpm  60 bpm  71 bpm  78 bpm  57 bpm   Heart Rate (Exercise)  91 bpm  79 bpm  99 bpm  105 bpm  64 bpm   Heart Rate (Exit)  75 bpm  75 bpm  78 bpm  80 bpm  85 bpm   Oxygen Saturation (Admit)  95 %  96 %  96 %  97 %  99 %   Oxygen Saturation  (Exercise)  94 %  90 %  93 %  93 %  91 %   Oxygen Saturation (Exit)  94 %  97 %  93 %  95 %  97 %   Rating of Perceived Exertion (Exercise)  13  13  10  12  14    Perceived Dyspnea (Exercise)  14  13  11  12  12    Duration  Progress to 30 minutes of  aerobic without signs/symptoms of physical distress  Progress to 30 minutes of  aerobic without signs/symptoms of physical distress  Progress to 30 minutes of  aerobic without signs/symptoms of physical distress  Progress to 30 minutes of  aerobic without signs/symptoms of physical distress  Progress to 30 minutes of  aerobic without signs/symptoms of physical distress   Intensity  THRR New [id number]  THRR New 92-108-124  THRR New 99-112-126  THRR New 103-122-136  THRR New 90-107-123     Progression   Progression  Continue to progress workloads to maintain intensity without signs/symptoms of physical distress.  Continue to progress workloads to maintain intensity without signs/symptoms of physical distress.  Continue to progress workloads to maintain intensity without signs/symptoms of physical distress.  Continue to progress workloads to maintain intensity without signs/symptoms of physical distress.  Continue to progress workloads to maintain intensity without signs/symptoms of physical distress.     Resistance Training   Training Prescription  Yes  Yes  Yes  Yes  Yes   Weight  3  2  2  3  4    Reps  10-15  10-15  10-15  10-15  10-15     Treadmill   MPH  1  1.2  1.2  -  1.2   Grade  0  0  0  -  0   Minutes  15  15  15   -  15   METs  1.7  1.9  1.9  -  1.9     NuStep   Level  1  1  1  1  1    SPM  87  72  84  88  82   Minutes  20  20  20  20  20    METs  1.7  1.8  1.9  2.2  1.9     Arm Ergometer   Level  -  -  -  1.3  -   Watts  -  -  -  12  -   Minutes  -  -  -  15  -   METs  -  -  -  1.9  -     Home Exercise Plan   Plans to continue exercise at  Home (comment)  Home (comment)  Home (comment)  Home (comment)  Home (comment)   Frequency   Add 3 additional days to program exercise sessions.  Add 3 additional days to program exercise sessions.  Add 3 additional days to program exercise sessions.  Add 3 additional days to program exercise sessions.  Add 3 additional days to program exercise sessions.   Initial Home Exercises Provided  12/05/17  12/05/17  12/05/17  12/05/17  12/05/17   Row Name 02/18/18 0800 03/09/18 1500           Response to Exercise   Blood Pressure (Admit)  132/64  140/58  (Pended)       Blood Pressure (Exercise)  142/70  138/80  (Pended)  Blood Pressure (Exit)  136/70  150/70  (Pended)       Heart Rate (Admit)  82 bpm  63 bpm  (Pended)       Heart Rate (Exercise)  88 bpm  101 bpm  (Pended)       Heart Rate (Exit)  89 bpm  65 bpm  (Pended)       Oxygen Saturation (Admit)  91 %  97 %  (Pended)       Oxygen Saturation (Exercise)  93 %  91 %  (Pended)       Oxygen Saturation (Exit)  97 %  91 %  (Pended)       Rating of Perceived Exertion (Exercise)  13  13  (Pended)       Perceived Dyspnea (Exercise)  13  12  (Pended)       Duration  Progress to 30 minutes of  aerobic without signs/symptoms of physical distress  Progress to 30 minutes of  aerobic without signs/symptoms of physical distress  (Pended)       Intensity  THRR New 105-117-128  THRR New  (Pended)  [phone]        Progression   Progression  Continue to progress workloads to maintain intensity without signs/symptoms of physical distress.  Continue to progress workloads to maintain intensity without signs/symptoms of physical distress.  (Pended)       Average METs  -  1.8  (Pended)         Resistance Training   Training Prescription  Yes  Yes  (Pended)       Weight  4  4  (Pended)       Reps  10-15  10-15  (Pended)       Time  -  5 Minutes  (Pended)         NuStep   Level  1  -      SPM  75  -      Minutes  20  -      METs  1.8  -        Arm Ergometer   Level  1.3  -      Watts  8  -      Minutes  15  -      METs  1.8  -         Home Exercise Plan   Plans to continue exercise at  Home (comment)  Home (comment)  (Pended)       Frequency  Add 3 additional days to program exercise sessions.  Add 3 additional days to program exercise sessions.  (Pended)       Initial Home Exercises Provided  12/05/17  12/05/17  (Pended)          Exercise Comments:  Exercise Comments    Row Name 12/12/17 0809 12/24/17 1021 01/06/18 1513 01/20/18 1443 02/05/18 1403   Exercise Comments  Patient is doing well in PR and has increased his SPMs on the nustep machine while maintaining his speed on the treadmill. Patient O2 levels are looking good while he is being active not often dropping below 90. We ar eworking on increasing his stability levels by helping him keep his balance on the treadmill by holding on to the support bars. We will continue to monitor the patient throughout the remainder of the program.   Patient continues to do well in PR. Patient has increased his speed on the treadmill to 1.2. Patient has  also been maintaining his level and SPMs on the machine. Patient seems to be enjoying the program and has stated that he is feeling some energy coming back to him since he is only on 8 sessions of the program.   Patient continues to do well in PR. Patient has maintained his speed on the treadmill and has also maintained his level on the nustep machine. patient has increased his SPMs and METS on the nustep. Patient stated that He has better mobility since beginning the program and can breathe better now.   Patient is doing well in PR. Patient has been moved off of the treadmill due to uneasiness. Patient was moved to the arm ergometer and has been doing well on level 1.3. Patient has also increased his SPMs on the nustep machine and has increased his weight for the dumbbells to 3lbs each session.   Patient has started using the treadmill again and has been affected by the cancer treatments that he has been receiving. Patien thas not progressed due  to these treatments making him fatigued and weak.    [place] Name 02/18/18 [phone]           Exercise Comments  Patient has been doing well in PR. Patient has been moved to the arm ergometer due to lower extremity weakness caused from cancer treatments. Patient has been doing well on both machines and has positive attitude.           Exercise Goals and Review:  Exercise Goals    Row Name 11/20/17 1015             Exercise Goals   Increase Physical Activity  Yes       Intervention  Provide advice, education, support and counseling about physical activity/exercise needs.;Develop an individualized exercise prescription for aerobic and resistive training based on initial evaluation findings, risk stratification, comorbidities and participant's personal goals.       Expected Outcomes  Short Term: Attend rehab on a regular basis to increase amount of physical activity.       Increase Strength and Stamina  Yes       Intervention  Provide advice, education, support and counseling about physical activity/exercise needs.;Develop an individualized exercise prescription for aerobic and resistive training based on initial evaluation findings, risk stratification, comorbidities and participant's personal goals.       Expected Outcomes  Short Term: Increase workloads from initial exercise prescription for resistance, speed, and METs.;Long Term: Improve cardiorespiratory fitness, muscular endurance and strength as measured by increased METs and functional capacity (6MWT);Short Term: Perform resistance training exercises routinely during rehab and add in resistance training at home       Able to understand and use rate of perceived exertion (RPE) scale  Yes       Intervention  Provide education and explanation on how to use RPE scale       Expected Outcomes  Short Term: Able to use RPE daily in rehab to express subjective intensity level;Long Term:  Able to use RPE to guide intensity level when exercising  independently       Able to understand and use Dyspnea scale  Yes       Intervention  Provide education and explanation on how to use Dyspnea scale       Expected Outcomes  Short Term: Able to use Dyspnea scale daily in rehab to express subjective sense of shortness of breath during exertion;Long Term: Able to use Dyspnea scale to guide intensity level when  exercising independently       Knowledge and understanding of Target Heart Rate Range (THRR)  Yes       Intervention  Provide education and explanation of THRR including how the numbers were predicted and where they are located for reference       Expected Outcomes  Short Term: Able to state/look up THRR;Long Term: Able to use THRR to govern intensity when exercising independently;Short Term: Able to use daily as guideline for intensity in rehab       Able to check pulse independently  Yes       Intervention  Provide education and demonstration on how to check pulse in carotid and radial arteries.;Review the importance of being able to check your own pulse for safety during independent exercise       Expected Outcomes  Short Term: Able to explain why pulse checking is important during independent exercise;Long Term: Able to check pulse independently and accurately       Understanding of Exercise Prescription  Yes       Intervention  Provide education, explanation, and written materials on patient's individual exercise prescription       Expected Outcomes  Short Term: Able to explain program exercise prescription;Long Term: Able to explain home exercise prescription to exercise independently          Exercise Goals Re-Evaluation : Exercise Goals Re-Evaluation    Row Name 12/16/17 1508 01/06/18 1514 01/28/18 0821 02/24/18 1507       Exercise Goal Re-Evaluation   Exercise Goals Review  Increase Physical Activity;Increase Strength and Stamina;Able to understand and use rate of perceived exertion (RPE) scale;Able to check pulse  independently;Knowledge and understanding of Target Heart Rate Range (THRR);Able to understand and use Dyspnea scale;Understanding of Exercise Prescription  Increase Physical Activity;Increase Strength and Stamina;Able to understand and use rate of perceived exertion (RPE) scale;Able to check pulse independently;Knowledge and understanding of Target Heart Rate Range (THRR);Able to understand and use Dyspnea scale;Understanding of Exercise Prescription  Increase Physical Activity;Increase Strength and Stamina;Able to understand and use rate of perceived exertion (RPE) scale;Able to check pulse independently;Knowledge and understanding of Target Heart Rate Range (THRR);Able to understand and use Dyspnea scale;Understanding of Exercise Prescription  Increase Physical Activity;Increase Strength and Stamina;Able to understand and use rate of perceived exertion (RPE) scale;Able to check pulse independently;Knowledge and understanding of Target Heart Rate Range (THRR);Able to understand and use Dyspnea scale;Understanding of Exercise Prescription    Comments  Patient has been doing well since beginning the program. Patient is only on sixth session but has shown great potential on the nustep machine gaining an average SPMs of 87 so far. Patient has also maintained his speed on the treadmill and has kept his O2 level above 90 while on it. Patients goals are being worked on becoming met. Patient will be continued to be monitored throughout the remainder of the program.   Patient continues to do well in PR. Patient has maintained his speed on the treadmill and has also maintained his level on the nustep machine. patient has increased his SPMs and METS on the nustep. Patient stated that He has better mobility since beginning the program and can breathe better now.   Patient continues to do well in CR. patient has stated to me that he still feels uneasy with his legs which is hindering much progression on the treadmill however  he does feel more endurance since beginning the program which is helping him throughout the day.   Patient  is doing very well in PR. Patient has stated to me that he has been breathing better and has much better breathing technique since beginning the program. His legs still are weak from cancer treatments and that is hindering much progression especially on the treadmill machine.     Expected Outcomes  Patient wishes to increase his lung capacity and to strength legs as well as gain stability.   Patient wishes to increase his lung capacity and to strength legs as well as gain stability.   Patient wishes to increase his lung capacity and to strength legs as well as gain stability.   Patient wishes to increase his lung capacity and to strength legs as well as gain stability.        Discharge Exercise Prescription (Final Exercise Prescription Changes): Exercise Prescription Changes - 03/09/18 1500      Response to Exercise   Blood Pressure (Admit)  140/58  (Pended)     Blood Pressure (Exercise)  138/80  (Pended)     Blood Pressure (Exit)  150/70  (Pended)     Heart Rate (Admit)  63 bpm  (Pended)     Heart Rate (Exercise)  101 bpm  (Pended)     Heart Rate (Exit)  65 bpm  (Pended)     Oxygen Saturation (Admit)  97 %  (Pended)     Oxygen Saturation (Exercise)  91 %  (Pended)     Oxygen Saturation (Exit)  91 %  (Pended)     Rating of Perceived Exertion (Exercise)  13  (Pended)     Perceived Dyspnea (Exercise)  12  (Pended)     Duration  Progress to 30 minutes of  aerobic without signs/symptoms of physical distress  (Pended)     Intensity  THRR New  (Pended)    [phone]     Progression   Progression  Continue to progress workloads to maintain intensity without signs/symptoms of physical distress.  (Pended)     Average METs  1.8  (Pended)       Resistance Training   Training Prescription  Yes  (Pended)     Weight  4  (Pended)     Reps  10-15  (Pended)     Time  5 Minutes  (Pended)        Home Exercise Plan   Plans to continue exercise at  Home (comment)  (Pended)     Frequency  Add 3 additional days to program exercise sessions.  (Pended)     Initial Home Exercises Provided  12/05/17  (Pended)        Nutrition:  Target Goals: Understanding of nutrition guidelines, daily intake of sodium <157m, cholesterol <2012m calories 30% from fat and 7% or less from saturated fats, daily to have 5 or more servings of fruits and vegetables.  Biometrics: Pre Biometrics - 11/20/17 1015      Pre Biometrics   Height  5' 9"  (1.753 m)    Weight  61.2 kg    Waist Circumference  32 inches    Hip Circumference  36 inches    Waist to Hip Ratio  0.89 %    BMI (Calculated)  19.91    Triceps Skinfold  10 mm    % Body Fat  19.7 %    Grip Strength  44.53 kg    Flexibility  0 in    Single Leg Stand  0 seconds        Nutrition Therapy Plan and Nutrition Goals: Nutrition  Therapy & Goals - 11/21/17 1524      Personal Nutrition Goals   Nutrition Goal  For heart healthy choices add >50% of whole grains, make half their plate fruits and vegetables. Discuss the difference between starchy vegetables and leafy greens, and how leafy vegetables provide fiber, helps maintain healthy weight, helps control blood glucose, and lowers cholesterol.  Discuss purchasing fresh or frozen vegetable to reduce sodium and not to add grease, fat or sugar. Consume <18oz of red meat per week. Consume lean cuts of meats and very little of meats high in sodium and nitrates such as pork and lunch meats. Discussed portion control for all food groups.      Personal Goal #2  He eats very little. When is eating he eats a heart healthy diet.     Comments  Patient met with RD 11/21/17.      Intervention Plan   Intervention  Nutrition handout(s) given to patient.    Expected Outcomes  Short Term Goal: Understand basic principles of dietary content, such as calories, fat, sodium, cholesterol and nutrients.       Nutrition  Assessments: Nutrition Assessments - 11/20/17 1132      MEDFICTS Scores   Pre Score  3       Nutrition Goals Re-Evaluation: Nutrition Goals Re-Evaluation    [place] Name 12/19/17 1445 01/09/18 1538 01/31/18 1419 02/27/18 0906       Goals   Current Weight  136 lb (61.7 kg)  135 lb (61.2 kg)  138 lb (62.6 kg)  136 lb (61.7 kg)    Nutrition Goal  For heart healthy choices add >50% of whole grains, make half their plate fruits and vegetables. Discuss the difference between starchy vegetables and leafy greens, and how leafy vegetables provide fiber, helps maintain healthy weight, helps control blood glucose, and lowers cholesterol.  Discuss purchasing fresh or frozen vegetable to reduce sodium and not to add grease, fat or sugar. Consume <18oz of red meat per week. Consume lean cuts of meats and very little of meats high in sodium and nitrates such as pork and lunch meats. Discussed portion control for all food groups.    For heart healthy choices add >50% of whole grains, make half their plate fruits and vegetables. Discuss the difference between starchy vegetables and leafy greens, and how leafy vegetables provide fiber, helps maintain healthy weight, helps control blood glucose, and lowers cholesterol.  Discuss purchasing fresh or frozen vegetable to reduce sodium and not to add grease, fat or sugar. Consume <18oz of red meat per week. Consume lean cuts of meats and very little of meats high in sodium and nitrates such as pork and lunch meats. Discussed portion control for all food groups.    For heart healthy choices add >50% of whole grains, make half their plate fruits and vegetables. Discuss the difference between starchy vegetables and leafy greens, and how leafy vegetables provide fiber, helps maintain healthy weight, helps control blood glucose, and lowers cholesterol.  Discuss purchasing fresh or frozen vegetable to reduce sodium and not to add grease, fat or sugar. Consume <18oz of red meat per  week. Consume lean cuts of meats and very little of meats high in sodium and nitrates such as pork and lunch meats. Discussed portion control for all food groups.    For heart healthy choices add >50% of whole grains, make half their plate fruits and vegetables. Discuss the difference between starchy vegetables and leafy greens, and how leafy vegetables provide  fiber, helps maintain healthy weight, helps control blood glucose, and lowers cholesterol.  Discuss purchasing fresh or frozen vegetable to reduce sodium and not to add grease, fat or sugar. Consume <18oz of red meat per week. Consume lean cuts of meats and very little of meats high in sodium and nitrates such as pork and lunch meats. Discussed portion control for all food groups.      Comment  Patient has gained 2 lbs since his initial visit. He says he eat a healthy diet. Will continue to monitor.   Patient has lost 1 lb since last 30 day review. He continues to say he is trying to eat a healthy diet. Will continue to monitor for progress.   Patient has lost 3 lbs since last 30 day review. He continues to say he is trying to eat a healthy diet. Will continue to monitor for progress.   Patient has lost 2 lbs since his last 30 day review. He says he continues to eat a healthy diet. He continues to have a reduced appetite. Will continue to monitor for progress.     Expected Outcome  -  Patient will continue to meet his nutritional goals.   Patient will continue to meet his nutritional goals.   Patient will continue to meet his nutritional goals.       Personal Goal #2 Re-Evaluation   Personal Goal #2  He eats very little. When is eating he eats a heart healthy diet.   He eats very little. When is eating he eats a heart healthy diet.   -  -       Nutrition Goals Discharge (Final Nutrition Goals Re-Evaluation): Nutrition Goals Re-Evaluation - 02/27/18 0906      Goals   Current Weight  136 lb (61.7 kg)    Nutrition Goal  For heart healthy choices  add >50% of whole grains, make half their plate fruits and vegetables. Discuss the difference between starchy vegetables and leafy greens, and how leafy vegetables provide fiber, helps maintain healthy weight, helps control blood glucose, and lowers cholesterol.  Discuss purchasing fresh or frozen vegetable to reduce sodium and not to add grease, fat or sugar. Consume <18oz of red meat per week. Consume lean cuts of meats and very little of meats high in sodium and nitrates such as pork and lunch meats. Discussed portion control for all food groups.      Comment  Patient has lost 2 lbs since his last 30 day review. He says he continues to eat a healthy diet. He continues to have a reduced appetite. Will continue to monitor for progress.     Expected Outcome  Patient will continue to meet his nutritional goals.        Psychosocial: Target Goals: Acknowledge presence or absence of significant depression and/or stress, maximize coping skills, provide positive support system. Participant is able to verbalize types and ability to use techniques and skills needed for reducing stress and depression.  Initial Review & Psychosocial Screening: Initial Psych Review & Screening - 11/20/17 1134      Initial Review   Current issues with  None Identified      Family Dynamics   Good Support System?  Yes      Barriers   Psychosocial barriers to participate in program  There are no identifiable barriers or psychosocial needs.      Screening Interventions   Interventions  Encouraged to exercise    Expected Outcomes  Short Term goal: Identification and  review with participant of any Quality of Life or Depression concerns found by scoring the questionnaire.;Long Term goal: The participant improves quality of Life and PHQ9 Scores as seen by post scores and/or verbalization of changes       Quality of Life Scores: Quality of Life - 11/20/17 1016      Quality of Life Scores   Health/Function Pre  14.23 %     Socioeconomic Pre  24.75 %    Psych/Spiritual Pre  21.36 %    Family Pre  22.5 %    GLOBAL Pre  18.83 %      Scores of 19 and below usually indicate a poorer quality of life in these areas.  A difference of  2-3 points is a clinically meaningful difference.  A difference of 2-3 points in the total score of the Quality of Life Index has been associated with significant improvement in overall quality of life, self-image, physical symptoms, and general health in studies assessing change in quality of life.   PHQ-9: Recent Review Flowsheet Data    Depression screen [facility] 2/9 11/20/2017   Decreased Interest 1   Down, Depressed, Hopeless 0   PHQ - 2 Score 1   Altered sleeping 0   Tired, decreased energy 2   Change in appetite 0   Feeling bad or failure about yourself  0   Trouble concentrating 0   Moving slowly or fidgety/restless 0   Suicidal thoughts 0   PHQ-9 Score 3   Difficult doing work/chores Not difficult at all     Interpretation of Total Score  Total Score Depression Severity:  1-4 = Minimal depression, 5-9 = Mild depression, 10-14 = Moderate depression, 15-19 = Moderately severe depression, 20-27 = Severe depression   Psychosocial Evaluation and Intervention: Psychosocial Evaluation - 11/20/17 1145      Psychosocial Evaluation & Interventions   Interventions  Encouraged to exercise with the program and follow exercise prescription    Continue Psychosocial Services   Follow up required by staff       Psychosocial Re-Evaluation: Psychosocial Re-Evaluation    [place] Name 12/19/17 1449 01/09/18 1543 01/31/18 1426 02/27/18 0913       Psychosocial Re-Evaluation   Current issues with  None Identified  None Identified  None Identified  None Identified    Comments  Patient's initial QOL score was 18.83 and his PHQ-9 score was 3 with no psychosocial issues identified.   Patient's initial QOL score was 18.83 and his PHQ-9 score was 3 with no psychosocial issues identified.    Patient's initial QOL score was 18.83 and his PHQ-9 score was 3 with no psychosocial issues identified.   Patient's initial QOL score was 18.83 and his PHQ-9 score was 3 with no psychosocial issues identified.     Expected Outcomes  Patient will have no psychosocial issues identified at discharge.   Patient will have no psychosocial issues identified at discharge.   Patient will have no psychosocial issues identified at discharge.   Patient will have no psychosocial issues identified at discharge.     Interventions  Relaxation education;Stress management education;Encouraged to attend Pulmonary Rehabilitation for the exercise  Relaxation education;Stress management education;Encouraged to attend Pulmonary Rehabilitation for the exercise  Relaxation education;Stress management education;Encouraged to attend Pulmonary Rehabilitation for the exercise  Relaxation education;Stress management education;Encouraged to attend Pulmonary Rehabilitation for the exercise    Continue Psychosocial Services   No Follow up required  No Follow up required  No Follow up required  No Follow up required       Psychosocial Discharge (Final Psychosocial Re-Evaluation): Psychosocial Re-Evaluation - 02/27/18 0913      Psychosocial Re-Evaluation   Current issues with  None Identified    Comments  Patient's initial QOL score was 18.83 and his PHQ-9 score was 3 with no psychosocial issues identified.     Expected Outcomes  Patient will have no psychosocial issues identified at discharge.     Interventions  Relaxation education;Stress management education;Encouraged to attend Pulmonary Rehabilitation for the exercise    Continue Psychosocial Services   No Follow up required        Education: Education Goals: Education classes will be provided on a weekly basis, covering required topics. Participant will state understanding/return demonstration of topics presented.  Learning Barriers/Preferences: Learning  Barriers/Preferences - 11/20/17 9604      Learning Barriers/Preferences   Learning Barriers  None    Learning Preferences  Skilled Demonstration;Individual Instruction;Group Instruction       Education Topics: How Lungs Work and Diseases: - Discuss the anatomy of the lungs and diseases that can affect the lungs, such as COPD.   Exercise: -Discuss the importance of exercise, FITT principles of exercise, normal and abnormal responses to exercise, and how to exercise safely.   PULMONARY REHAB OTHER RESPIRATORY from 02/13/2018 in [place]  Date  12/19/17  Educator  [place]  Instruction Review Code  2- Demonstrated Understanding      Environmental Irritants: -Discuss types of environmental irritants and how to limit exposure to environmental irritants.   Meds/Inhalers and oxygen: - Discuss respiratory medications, definition of an inhaler and oxygen, and the proper way to use an inhaler and oxygen.   PULMONARY REHAB OTHER RESPIRATORY from 02/13/2018 in [place]  Date  01/09/18  Educator  DC      Energy Saving Techniques: - Discuss methods to conserve energy and decrease shortness of breath when performing activities of daily living.    PULMONARY REHAB OTHER RESPIRATORY from 02/13/2018 in [place]  Date  01/16/18  Educator  [place]  Instruction Review Code  2- Demonstrated Understanding      Bronchial Hygiene / Breathing Techniques: - Discuss breathing mechanics, pursed-lip breathing technique,  proper posture, effective ways to clear airways, and other functional breathing techniques   PULMONARY REHAB OTHER RESPIRATORY from 02/13/2018 in [place]  Date  01/23/18  Educator  DJ  Instruction Review Code  2- Demonstrated Understanding      Cleaning Equipment: - Provides group verbal and written instruction about the health risks of elevated stress, cause of high stress, and healthy ways  to reduce stress.   Nutrition I: Fats: - Discuss the types of cholesterol, what cholesterol does to the body, and how cholesterol levels can be controlled.   PULMONARY REHAB OTHER RESPIRATORY from 02/13/2018 in [place]  Date  02/06/18  Educator  [place]  Instruction Review Code  2- Demonstrated Understanding      Nutrition II: Labels: -Discuss the different components of food labels and how to read food labels.   PULMONARY REHAB OTHER RESPIRATORY from 02/13/2018 in [place]  Date  02/13/18  Educator  D. Coad  Instruction Review Code  2- Demonstrated Understanding      Respiratory Infections: - Discuss the signs and symptoms of respiratory infections, ways to prevent respiratory infections, and the importance of seeking medical treatment when having a respiratory infection.   Stress I: Signs  and Symptoms: - Discuss the causes of stress, how stress may lead to anxiety and depression, and ways to limit stress.   PULMONARY REHAB OTHER RESPIRATORY from 02/13/2018 in [place]  Date  11/28/17  Educator  [place]  Instruction Review Code  2- Demonstrated Understanding      Stress II: Relaxation: -Discuss relaxation techniques to limit stress.   PULMONARY REHAB OTHER RESPIRATORY from 02/13/2018 in [place]  Date  12/05/17  Educator  [place]  Instruction Review Code  2- Demonstrated Understanding      Oxygen for Home/Travel: - Discuss how to prepare for travel when on oxygen and proper ways to transport and store oxygen to ensure safety.   Knowledge Questionnaire Score: Knowledge Questionnaire Score - 11/20/17 1019      Knowledge Questionnaire Score   Pre Score  11/18       Core Components/Risk Factors/Patient Goals at Admission: Personal Goals and Risk Factors at Admission - 11/20/17 1132      Core Components/Risk Factors/Patient Goals on Admission    Weight Management  Weight  Maintenance    Personal Goal Other  Yes    Personal Goal  Increase lung capacity, Strengthen legs, gain stability.     Intervention  Attend PR 2 x week and supplement at home exercise 3 x week.     Expected Outcomes  Reach goals.        Core Components/Risk Factors/Patient Goals Review:  Goals and Risk Factor Review    Row Name 12/19/17 1446 01/09/18 1539 01/31/18 1420 02/27/18 0909       Core Components/Risk Factors/Patient Goals Review   Personal Goals Review  Weight Management/Obesity Increase lung capacity; stengthen legs; gain stability.   Weight Management/Obesity Increase lung capacity; strengthen legs; gain stability.   Weight Management/Obesity Increase lung capacity; strengthen legs; gain stability.   Weight Management/Obesity;Improve shortness of breath with ADL's Increase lung capacity; strengthen legs; gain stability.    Review  Patient has completed 8 sessions gaining 2 lbs which is needed since his BMI is 19.2. He says he was only able to take 10 to 12 steps without having to stop and rest before he started our program. He is now able to walk further and he is pleased with his progress so far. He hopes to gain more strength in his legs as he continues. Will continue to monitor for progress.   Patient has completed 12 sessions maintaining his weight since last 30 day review. He continues to say the program has helped him mainly walk better and he is able to walk longer distance. He can not tell a difference with his breathing yet but he feels that the program is helping him. Will continue to monitor for progress.    Patient has completed 17 sessions losing 3 lbs since last 30 day review. He continues to do well in the program with progression. He received his 6 month Lupron-Depro injection 01/24/18 which makes him have lower extremity weakness. He was not able to complete his session 6/11 and was not able to attend 6/13. He does feel the program is helping him, but treatment for his cancer  impeded his progress. Will continue to monitor.   Patient has completed 22 sessions losing 2 lbs since last 30 day review. He continues to do well in the program with some progression. He continues treatment for prostate cancer which causes weakness at times. Inspite of this, he says he feels the program is helping him be able  to walk further. He says the pursed lip breathing technique he learned here has done "wonders" for his SOB. He says this has helped him the most learning this technique. Will continue to monitor for progress.     Expected Outcomes  Patient will continue to attend sessions and complete the program meeting his personal goals.   Patient will continue to attend sessions and complete the program meeting his personal goals.   Patient will continue to attend sessions and complete the program meeting his personal goals.   Patient will continue to attend sessions and complete the program meeting his personal goals.        Core Components/Risk Factors/Patient Goals at Discharge (Final Review):  Goals and Risk Factor Review - 02/27/18 0909      Core Components/Risk Factors/Patient Goals Review   Personal Goals Review  Weight Management/Obesity;Improve shortness of breath with ADL's   Increase lung capacity; strengthen legs; gain stability.   Review  Patient has completed 22 sessions losing 2 lbs since last 30 day review. He continues to do well in the program with some progression. He continues treatment for prostate cancer which causes weakness at times. Inspite of this, he says he feels the program is helping him be able to walk further. He says the pursed lip breathing technique he learned here has done "wonders" for his SOB. He says this has helped him the most learning this technique. Will continue to monitor for progress.     Expected Outcomes  Patient will continue to attend sessions and complete the program meeting his personal goals.        ITP Comments: ITP Comments    Row  Name 11/20/17 1129 11/21/17 1250 03/27/18 1319       ITP Comments  Mr. Villamar is a pleasant 82 year old gentleman who is getting ready to start. He will have several other doctors appointments due to having lung cancer and prostate cancer. He is eager to get started.   Patient new to program. Plans to start Tuesday 11/26/17. Will continue to monitor for progress.   Patient stopped attending after completing 23 sessions due to a compressue of L2. MD will be notified.         Comments: Patient stopped coming to Pulmonary on 03/04/18 due a compression fracture of L2. He is not able to return to the program. Doctor will be informed.

## 2018-03-27 NOTE — Progress Notes (Signed)
Discharge Progress Report  Patient Details  Name: Peter Lopez MRN: [id number] Date of Birth: March 05, 1936 Referring Provider:     PULMONARY REHAB COPD ORIENTATION from 11/20/2017 in [place]  Referring Provider  DR. Hawkins       Number of Visits: 23  Reason for Discharge:  Early Exit:  Health  Smoking History:  Social History   Tobacco Use  Smoking Status Former Smoker  . Packs/day: 1.00  . Last attempt to quit: 12/11/1998  . Years since quitting: 19.3  Smokeless Tobacco Never Used    Diagnosis:  Chronic obstructive pulmonary disease, unspecified COPD type ([place])  ADL [facility]:   Initial Exercise Prescription:   Discharge Exercise Prescription (Final Exercise Prescription Changes): Exercise Prescription Changes - 03/09/18 1500      Response to Exercise   Blood Pressure (Admit)  140/58  (Pended)     Blood Pressure (Exercise)  138/80  (Pended)     Blood Pressure (Exit)  150/70  (Pended)     Heart Rate (Admit)  63 bpm  (Pended)     Heart Rate (Exercise)  101 bpm  (Pended)     Heart Rate (Exit)  65 bpm  (Pended)     Oxygen Saturation (Admit)  97 %  (Pended)     Oxygen Saturation (Exercise)  91 %  (Pended)     Oxygen Saturation (Exit)  91 %  (Pended)     Rating of Perceived Exertion (Exercise)  13  (Pended)     Perceived Dyspnea (Exercise)  12  (Pended)     Duration  Progress to 30 minutes of  aerobic without signs/symptoms of physical distress  (Pended)     Intensity  THRR New  (Pended)       Progression   Progression  Continue to progress workloads to maintain intensity without signs/symptoms of physical distress.  (Pended)     Average METs  1.8  (Pended)       Resistance Training   Training Prescription  Yes  (Pended)     Weight  4  (Pended)     Reps  10-15  (Pended)     Time  5 Minutes  (Pended)       Home Exercise Plan   Plans to continue exercise at  Home (comment)  (Pended)     Frequency  Add 3 additional days to program exercise  sessions.  (Pended)     Initial Home Exercises Provided  12/05/17  (Pended)        Functional Capacity:   Psychological, QOL, Others - Outcomes: PHQ 2/9: Depression screen PHQ 2/9 11/20/2017  Decreased Interest 1  Down, Depressed, Hopeless 0  PHQ - 2 Score 1  Altered sleeping 0  Tired, decreased energy 2  Change in appetite 0  Feeling bad or failure about yourself  0  Trouble concentrating 0  Moving slowly or fidgety/restless 0  Suicidal thoughts 0  PHQ-9 Score 3  Difficult doing work/chores Not difficult at all    Quality of Life:   Personal Goals: Goals established at orientation with interventions provided to work toward goal.    Personal Goals Discharge:   Exercise Goals and Review:   Nutrition & Weight - Outcomes:    Nutrition:   Nutrition Discharge:   Education Questionnaire Score:

## 2018-03-28 DIAGNOSIS — Z5111 Encounter for antineoplastic chemotherapy: Secondary | ICD-10-CM | POA: Diagnosis not present

## 2018-03-28 DIAGNOSIS — F17200 Nicotine dependence, unspecified, uncomplicated: Secondary | ICD-10-CM | POA: Diagnosis not present

## 2018-03-28 DIAGNOSIS — Z923 Personal history of irradiation: Secondary | ICD-10-CM | POA: Diagnosis not present

## 2018-03-28 DIAGNOSIS — C3431 Malignant neoplasm of lower lobe, right bronchus or lung: Secondary | ICD-10-CM | POA: Diagnosis not present

## 2018-03-28 DIAGNOSIS — R296 Repeated falls: Secondary | ICD-10-CM | POA: Diagnosis not present

## 2018-03-28 DIAGNOSIS — Z192 Hormone resistant malignancy status: Secondary | ICD-10-CM | POA: Diagnosis not present

## 2018-03-28 DIAGNOSIS — R2689 Other abnormalities of gait and mobility: Secondary | ICD-10-CM | POA: Diagnosis not present

## 2018-03-28 DIAGNOSIS — C61 Malignant neoplasm of prostate: Secondary | ICD-10-CM | POA: Diagnosis not present

## 2018-04-02 ENCOUNTER — Telehealth: Payer: Self-pay | Admitting: Cardiology

## 2018-04-02 ENCOUNTER — Encounter: Payer: Self-pay | Admitting: *Deleted

## 2018-04-02 DIAGNOSIS — I10 Essential (primary) hypertension: Secondary | ICD-10-CM | POA: Diagnosis not present

## 2018-04-02 DIAGNOSIS — C3490 Malignant neoplasm of unspecified part of unspecified bronchus or lung: Secondary | ICD-10-CM | POA: Diagnosis not present

## 2018-04-02 DIAGNOSIS — J449 Chronic obstructive pulmonary disease, unspecified: Secondary | ICD-10-CM | POA: Diagnosis not present

## 2018-04-02 DIAGNOSIS — C61 Malignant neoplasm of prostate: Secondary | ICD-10-CM | POA: Diagnosis not present

## 2018-04-02 NOTE — Telephone Encounter (Signed)
Pt seen by PCP today.Pt states that MD at [facility] was consulted by phone and then he was sent home. Office note requested.

## 2018-04-02 NOTE — Telephone Encounter (Signed)
Called pt. No answer °

## 2018-04-02 NOTE — Telephone Encounter (Signed)
Based on reported findings I encourage him to be seen in ER. Evaluation in any clinic will be very limited. If he has lost the circulation to his leg it could be an emergency, and if not evalauted and diagnosed could lead to significant permanent damage to his foot.    Zandra Abts MD

## 2018-04-02 NOTE — Telephone Encounter (Signed)
Pt in office today c/o left ankle and toes discoloration and swelling. Pt noted to have bluish color to the toes of the left foot. Foot is cool to touch. Pt reports that foot is tender to touch. Ankle and heel of left foot are red in color. Pt does not report tenderness to right ankle or foot. Rt ankle does appear to have some redness but not as much as the left. Rt foot and toes have no redness or bluish color and is warm to touch. Pt does not report Rt foot as being tender to touch. Unable to palpate pulse in left foot, but able to palpate pulse in the Rt foot. Encouraged patient to be seen in the ER at this d/t office not having an appt. Pt declined. Requested that pt be seen by PCP today. Pt states that he will go by Dr. Luan Pulling office now. I have asked the pt to call me back and let me know if he was able to be seen today. He agreed. Informed pt that if he can not see PCP today that I would like him to be seen by the ER today . Pt voiced understanding.

## 2018-04-02 NOTE — Telephone Encounter (Signed)
Pt came into office with a complaint of swelling in his foot/ankle, states it's been going on for a few days and his foot has turned "completely black".

## 2018-04-04 NOTE — Telephone Encounter (Signed)
Wife notified.

## 2018-04-04 NOTE — Telephone Encounter (Signed)
Let patient know that I reviewed Dr Luan Pulling notes, appears the discoloration is due to bruising after a recent fall. Would just monitor at this time, let us know if discoloration worsens or increase in pain.   Zandra Abts MD

## 2018-04-07 ENCOUNTER — Telehealth: Payer: Self-pay | Admitting: Orthopaedic Surgery

## 2018-04-07 NOTE — Telephone Encounter (Signed)
Gave him one. Thanks

## 2018-04-07 NOTE — Telephone Encounter (Signed)
Patient came by office, relays he has lost the "wrench" to tighten his cash brace; states happened 1 week ago, and has looked everywhere. Please advise.

## 2018-04-23 ENCOUNTER — Ambulatory Visit (INDEPENDENT_AMBULATORY_CARE_PROVIDER_SITE_OTHER): Payer: PPO

## 2018-04-23 ENCOUNTER — Ambulatory Visit (INDEPENDENT_AMBULATORY_CARE_PROVIDER_SITE_OTHER): Payer: PPO | Admitting: Orthopaedic Surgery

## 2018-04-23 ENCOUNTER — Encounter: Payer: Self-pay | Admitting: Orthopaedic Surgery

## 2018-04-23 VITALS — Ht 66.0 in | Wt 127.0 lb

## 2018-04-23 DIAGNOSIS — S32020D Wedge compression fracture of second lumbar vertebra, subsequent encounter for fracture with routine healing: Secondary | ICD-10-CM

## 2018-04-23 NOTE — Progress Notes (Signed)
CC:  My back is better  He has less back pain.  He has been using his CASH brace.  He is deconditioned and is very weak. I have gone over some exercises for him to do.  NV intact.  X-rays were done of the lumbar spine, reported separately.  Encounter Diagnosis  Name Primary?  . Closed compression fracture of L2 lumbar vertebra, with routine healing, subsequent encounter Yes   Continue the CASH brace.  Return in one month.  X-rays then lateral lumbar spine.  Call if any problem.  Precautions discussed.   Electronically Signed Sanjuana Kava, MD 9/4/20192:55 PM

## 2018-04-24 ENCOUNTER — Ambulatory Visit (INDEPENDENT_AMBULATORY_CARE_PROVIDER_SITE_OTHER): Payer: PPO | Admitting: *Deleted

## 2018-04-24 DIAGNOSIS — I495 Sick sinus syndrome: Secondary | ICD-10-CM | POA: Diagnosis not present

## 2018-04-24 NOTE — Progress Notes (Signed)
Remote pacemaker transmission.   

## 2018-05-01 DIAGNOSIS — Z5111 Encounter for antineoplastic chemotherapy: Secondary | ICD-10-CM | POA: Diagnosis not present

## 2018-05-01 DIAGNOSIS — C61 Malignant neoplasm of prostate: Secondary | ICD-10-CM | POA: Diagnosis not present

## 2018-05-01 DIAGNOSIS — C3431 Malignant neoplasm of lower lobe, right bronchus or lung: Secondary | ICD-10-CM | POA: Diagnosis not present

## 2018-05-01 DIAGNOSIS — F17210 Nicotine dependence, cigarettes, uncomplicated: Secondary | ICD-10-CM | POA: Diagnosis not present

## 2018-05-01 DIAGNOSIS — Z192 Hormone resistant malignancy status: Secondary | ICD-10-CM | POA: Diagnosis not present

## 2018-05-06 DIAGNOSIS — H5212 Myopia, left eye: Secondary | ICD-10-CM | POA: Diagnosis not present

## 2018-05-06 DIAGNOSIS — H04123 Dry eye syndrome of bilateral lacrimal glands: Secondary | ICD-10-CM | POA: Diagnosis not present

## 2018-05-06 DIAGNOSIS — H52202 Unspecified astigmatism, left eye: Secondary | ICD-10-CM | POA: Diagnosis not present

## 2018-05-06 DIAGNOSIS — H538 Other visual disturbances: Secondary | ICD-10-CM | POA: Diagnosis not present

## 2018-05-06 DIAGNOSIS — Z961 Presence of intraocular lens: Secondary | ICD-10-CM | POA: Diagnosis not present

## 2018-05-10 ENCOUNTER — Other Ambulatory Visit: Payer: Self-pay

## 2018-05-10 ENCOUNTER — Emergency Department (HOSPITAL_COMMUNITY): Payer: PPO

## 2018-05-10 ENCOUNTER — Emergency Department (HOSPITAL_COMMUNITY)
Admission: EM | Admit: 2018-05-10 | Discharge: 2018-05-10 | Disposition: A | Discharge status: Home / Self Care | Payer: PPO | Attending: Emergency Medicine | Admitting: Emergency Medicine

## 2018-05-10 ENCOUNTER — Encounter (HOSPITAL_COMMUNITY): Payer: Self-pay | Admitting: Emergency Medicine

## 2018-05-10 DIAGNOSIS — S00511A Abrasion of lip, initial encounter: Secondary | ICD-10-CM | POA: Insufficient documentation

## 2018-05-10 DIAGNOSIS — Z87891 Personal history of nicotine dependence: Secondary | ICD-10-CM | POA: Diagnosis not present

## 2018-05-10 DIAGNOSIS — I119 Hypertensive heart disease without heart failure: Secondary | ICD-10-CM | POA: Insufficient documentation

## 2018-05-10 DIAGNOSIS — W19XXXA Unspecified fall, initial encounter: Secondary | ICD-10-CM | POA: Diagnosis not present

## 2018-05-10 DIAGNOSIS — Y999 Unspecified external cause status: Secondary | ICD-10-CM | POA: Insufficient documentation

## 2018-05-10 DIAGNOSIS — Z950 Presence of cardiac pacemaker: Secondary | ICD-10-CM | POA: Insufficient documentation

## 2018-05-10 DIAGNOSIS — T07XXXA Unspecified multiple injuries, initial encounter: Secondary | ICD-10-CM

## 2018-05-10 DIAGNOSIS — R220 Localized swelling, mass and lump, head: Secondary | ICD-10-CM | POA: Diagnosis not present

## 2018-05-10 DIAGNOSIS — S0010XA Contusion of unspecified eyelid and periocular area, initial encounter: Secondary | ICD-10-CM | POA: Diagnosis not present

## 2018-05-10 DIAGNOSIS — Y9301 Activity, walking, marching and hiking: Secondary | ICD-10-CM | POA: Insufficient documentation

## 2018-05-10 DIAGNOSIS — S32000A Wedge compression fracture of unspecified lumbar vertebra, initial encounter for closed fracture: Secondary | ICD-10-CM | POA: Diagnosis not present

## 2018-05-10 DIAGNOSIS — Z8546 Personal history of malignant neoplasm of prostate: Secondary | ICD-10-CM | POA: Diagnosis not present

## 2018-05-10 DIAGNOSIS — Z7982 Long term (current) use of aspirin: Secondary | ICD-10-CM | POA: Diagnosis not present

## 2018-05-10 DIAGNOSIS — R51 Headache: Secondary | ICD-10-CM | POA: Insufficient documentation

## 2018-05-10 DIAGNOSIS — Y92480 Sidewalk as the place of occurrence of the external cause: Secondary | ICD-10-CM | POA: Insufficient documentation

## 2018-05-10 DIAGNOSIS — W101XXA Fall (on)(from) sidewalk curb, initial encounter: Secondary | ICD-10-CM | POA: Diagnosis not present

## 2018-05-10 DIAGNOSIS — S0191XA Laceration without foreign body of unspecified part of head, initial encounter: Secondary | ICD-10-CM | POA: Diagnosis not present

## 2018-05-10 DIAGNOSIS — S0990XA Unspecified injury of head, initial encounter: Secondary | ICD-10-CM | POA: Diagnosis not present

## 2018-05-10 DIAGNOSIS — S0081XA Abrasion of other part of head, initial encounter: Secondary | ICD-10-CM | POA: Insufficient documentation

## 2018-05-10 DIAGNOSIS — S0993XA Unspecified injury of face, initial encounter: Secondary | ICD-10-CM | POA: Diagnosis not present

## 2018-05-10 DIAGNOSIS — S199XXA Unspecified injury of neck, initial encounter: Secondary | ICD-10-CM | POA: Diagnosis not present

## 2018-05-10 DIAGNOSIS — Z23 Encounter for immunization: Secondary | ICD-10-CM | POA: Diagnosis not present

## 2018-05-10 DIAGNOSIS — Z79899 Other long term (current) drug therapy: Secondary | ICD-10-CM | POA: Insufficient documentation

## 2018-05-10 DIAGNOSIS — S0031XA Abrasion of nose, initial encounter: Secondary | ICD-10-CM | POA: Insufficient documentation

## 2018-05-10 DIAGNOSIS — R58 Hemorrhage, not elsewhere classified: Secondary | ICD-10-CM | POA: Diagnosis not present

## 2018-05-10 MED ORDER — TETANUS-DIPHTH-ACELL PERTUSSIS 5-2.5-18.5 LF-MCG/0.5 IM SUSP
0.5000 mL | Freq: Once | INTRAMUSCULAR | Status: AC
  Administered 2018-05-10: 0.5 mL via INTRAMUSCULAR
  Filled 2018-05-10: qty 0.5

## 2018-05-10 MED ORDER — BACITRACIN-NEOMYCIN-POLYMYXIN 400-5-5000 EX OINT
TOPICAL_OINTMENT | CUTANEOUS | Status: AC
  Filled 2018-05-10: qty 1

## 2018-05-10 NOTE — ED Provider Notes (Signed)
Medical screening examination/treatment/procedure(s) were conducted as a shared visit with non-physician practitioner(s) and myself.  I personally evaluated the patient during the encounter.  Clinical Impression:   Final diagnoses:  Fall, initial encounter  Abrasions of multiple sites  Contusion of periocular region, unspecified laterality, initial encounter    The pt is a 82 y/o male - had a mechanical fall to the face when he tried to go up a curb - has suffered abrasions and contusion to the face and the forehead, the upper lip and the lower lip - and nose.  Has no open lacerations - has no ttp or deformity over the teeth and no injuries to the arms or legs - has mild bruising to the L shoulder and knee but normal ROM without pain.  tdap updated -care given, topical bacitracin and dressings applied, the patient's wife was updated, they are in agreement for discharge.  No underlying fractures based on CT scans, he is aware of his fractures of the back with compression deformities of the vertebrae.  Will ambulate prior to discharge.   Noemi Chapel, MD 05/10/18 [phone]

## 2018-05-10 NOTE — ED Provider Notes (Signed)
[facility] EMERGENCY DEPARTMENT Provider Note   CSN: [id number] Arrival date & time: 05/10/18  4098     History   Chief Complaint Chief Complaint  Patient presents with  . Fall    HPI Peter Lopez is a 82 y.o. male.  The history is provided by the patient. No language interpreter was used.  Fall  This is a new problem. The current episode started less than 1 hour ago. The problem occurs constantly. The problem has not changed since onset.Pertinent negatives include no chest pain and no headaches. Nothing aggravates the symptoms. Nothing relieves the symptoms. He has tried nothing for the symptoms. The treatment provided no relief.  Pt tripped over a curb and fell hitting his mouth and face.   Pt complains of pain in his low back.  Pt has had 2 compression fractures in the past. Pt complains of cuts on his forehead , nose and mouth   Past Medical History:  Diagnosis Date  . Arthritis   . 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 Benign essential tremor   . Exertional dyspnea   . Heart disease   . Hyperlipidemia   . Hypertension   . Pacemaker 11/24/2012   Dr Lovena Le  . Prostate cancer ([place])    S/P Prostatectomy  . Tobacco abuse    30 pack years; discontinued in 2000; currently one pack per week    Patient Active Problem List   Diagnosis Date Noted  . Emphysema of lung ([place]) 03/29/2016  . Pulmonary hypertension ([place])   . Weight loss 10/22/2014  . Pacemaker 02/18/2013  . Bronchitis 02/18/2013  . DJD (degenerative joint disease) of pelvis 12-20-202014  . Chronic atrial fibrillation ([place]) 09/29/2012  . Syncope 09/29/2012  . ETOH abuse 09/29/2012  . Essential hypertension 10/26/2010  . ADENOCARCINOMA, PROSTATE 05/05/2010  . Hyperlipidemia 05/05/2010  . TOBACCO ABUSE 05/05/2010  . TREMOR, ESSENTIAL 05/05/2010  . Sick sinus syndrome ([place]) 05/05/2010    Past Surgical History:  Procedure Laterality Date  . APPENDECTOMY    . CARDIAC CATHETERIZATION N/A 03/01/2016   Procedure: Right Heart Cath;  Surgeon: Troy Sine, MD;  Location: [place] [facility];  Service: Cardiovascular;  Laterality: N/A;  . CATARACT EXTRACTION W/PHACO Left 05/03/2015   Procedure: CATARACT EXTRACTION PHACO AND INTRAOCULAR LENS PLACEMENT LEFT EYE;  Surgeon: Rutherford Guys, MD;  Location: [facility];  Service: Ophthalmology;  Laterality: Left;  CDE:8.03  . COLONOSCOPY  2011  . HERNIA REPAIR    . INSERT / REPLACE / REMOVE PACEMAKER  11/24/2012   Dr Cristopher Peru  . KNEE SURGERY     Left laparoscopic knee surgery  . MENISCECTOMY     Right knee open meniscectomy  . PERMANENT PACEMAKER INSERTION N/A 11/24/2012   Procedure: PERMANENT PACEMAKER INSERTION;  Surgeon: Evans Lance, MD;  Location: [facility] CATH LAB;  Service: Cardiovascular;  Laterality: N/A;  . ROBOT ASSISTED LAPAROSCOPIC RADICAL PROSTATECTOMY          Home Medications    Prior to Admission medications   Medication Sig Start Date End Date Taking? Authorizing Provider  amLODipine-olmesartan (AZOR) 5-40 MG per tablet Take 1 tablet by mouth daily.   Yes [provider]  aspirin EC 81 MG tablet Take 81 mg by mouth daily.   Yes [provider]  Calcium Citrate-Vitamin D (CALCIUM + D PO) Take 1,200 mg by mouth daily.   Yes [provider]  folic acid (FOLVITE) 1 MG tablet Take 1 mg by  mouth daily.   Yes [provider]  Leuprolide Acetate, 6 Month, (LUPRON DEPOT, 49-MONTH,) 45 MG injection Inject 45 mg into the muscle every 6 (six) months.   Yes [provider]  potassium chloride SA (K-DUR,KLOR-CON) 20 MEQ tablet take 2 tablets by mouth once daily 06/24/17  Yes Branch, Alphonse Guild, MD  propranolol (INDERAL) 40 MG tablet Take 40 mg by mouth daily.  02/07/13  Yes [provider]  simvastatin (ZOCOR) 40 MG tablet Take 40 mg by mouth daily.     Yes [provider]  torsemide (DEMADEX) 20 MG tablet Take 20 mg by mouth daily.    Yes [provider]  traZODone (DESYREL) 50  MG tablet Take 1 tablet by mouth daily. 04/02/18  Yes [provider]  TRELEGY ELLIPTA 100-62.5-25 MCG/INH AEPB Inhale 1 puff into the lungs daily. 04/22/18  Yes [provider]  acetaminophen (TYLENOL) 500 MG tablet Take 1,000 mg by mouth every 6 (six) hours as needed for moderate pain.    [provider]  enzalutamide Gillermina Phy) 40 MG capsule Take 120 mg by mouth daily.     [provider]  methotrexate (RHEUMATREX) 2.5 MG tablet Take 2.5 mg by mouth once a week. sunday    [provider]    Family History Family History  Problem Relation Age of Onset  . Alzheimer's disease Mother 42  . Early death Father 22       trauma  . Heart failure Brother     Social History Social History   Tobacco Use  . Smoking status: Former Smoker    Packs/day: 1.00    Last attempt to quit: 12/11/1998    Years since quitting: 19.4  . Smokeless tobacco: Never Used  Substance Use Topics  . Alcohol use: Yes    Alcohol/week: 0.0 standard drinks    Comment: modest  . Drug use: No     Allergies   Rivaroxaban   Review of Systems Review of Systems  Cardiovascular: Negative for chest pain.  Skin: Positive for wound.  Neurological: Negative for headaches.  All other systems reviewed and are negative.    Physical Exam Updated Vital Signs BP (!) 163/84 (BP Location: Left Arm)   Pulse (!) 52   Temp 98.2 F (36.8 C) (Oral)   Resp (!) 24   Ht 5\' 6"  (1.676 m)   Wt 57.2 kg   SpO2 96%   BMI 20.34 kg/m   Physical Exam  Constitutional: He appears well-developed and well-nourished.  HENT:  Head: Normocephalic and atraumatic.  Superficial abrasion 2x1 cm area nose,  Bruise corner of left face by eye, 8 cm abrasion forehead. Abrasion left lower lip,    Eyes: Conjunctivae are normal.  Neck: Neck supple.  Cardiovascular: Normal rate and regular rhythm.  No murmur heard. Pulmonary/Chest: Effort normal and breath sounds normal. No respiratory distress.    Abdominal: Soft. There is no tenderness.  Musculoskeletal: He exhibits no edema.  diffusely tender ls spine,  Pain with movement   Neurological: He is alert.  Skin: Skin is warm and dry.  Psychiatric: He has a normal mood and affect.  Nursing note and vitals reviewed.    ED Treatments / Results  Labs (all labs ordered are listed, but only abnormal results are displayed) Labs Reviewed - No data to display  EKG None  Radiology Dg Lumbar Spine Complete  Result Date: 05/10/2018 CLINICAL DATA:  Fall, low back pain, recent lumbar compression fracture EXAM: LUMBAR SPINE -  COMPLETE 4+ VIEW COMPARISON:  03/18/2018 FINDINGS: Mild superior endplate compression fracture deformity at T12, unchanged. Mild to moderate superior endplate compression fracture deformity at L3, mildly progressed. No retropulsion. Mild to moderate multilevel degenerative changes, most prominent at L5-S1. Visualized bony pelvis appears intact. Vascular calcifications. IMPRESSION: Mild superior endplate compression fracture deformity at T12, unchanged. Mild to moderate superior endplate compression fracture deformity at L3, mildly progressed. No retropulsion. Mild to moderate degenerative changes. Electronically Signed   By: Julian Hy M.D.   On: 05/10/2018 10:28   Ct Head Wo Contrast  Result Date: 05/10/2018 CLINICAL DATA:  Fall, head and facial trauma, abrasions and lacerations EXAM: CT HEAD WITHOUT CONTRAST CT MAXILLOFACIAL WITHOUT CONTRAST CT CERVICAL SPINE WITHOUT CONTRAST TECHNIQUE: Multidetector CT imaging of the head, cervical spine, and maxillofacial structures were performed using the standard protocol without intravenous contrast. Multiplanar CT image reconstructions of the cervical spine and maxillofacial structures were also generated. COMPARISON:  03/18/2018 FINDINGS: CT HEAD FINDINGS Brain: Advanced brain atrophy and chronic white matter microvascular ischemic changes throughout both cerebral hemispheres.  No acute intracranial hemorrhage, mass lesion, definite new infarction, midline shift, herniation, hydrocephalus, or extra-axial fluid collection. Cisterns are patent. Cerebellar atrophy as well. Vascular: Intracranial atherosclerosis noted.  No hyperdense vessel. Skull: Normal. Negative for fracture or focal lesion. Other: None. CT MAXILLOFACIAL FINDINGS Osseous: Dental hardware creates artifact. Mandible appears intact. No displaced facial bony trauma or fracture. Specifically, the maxilla, pterygoid plates, zygomas, nasal septum, nasal bones, skull base, and orbits appear intact. Orbits: Negative. No traumatic or inflammatory finding. Sinuses: Sinuses remain clear. No sinus hemorrhage or air-fluid level. Soft tissues: Left inferior orbital/maxillary soft tissue swelling and bruising. CT CERVICAL SPINE FINDINGS Alignment: Normal. Skull base and vertebrae: No acute fracture. No primary bone lesion or focal pathologic process. Soft tissues and spinal canal: No prevertebral fluid or soft tissue swelling. No visible canal hematoma. Carotid atherosclerosis noted. Disc levels: Multilevel degenerative spondylosis, most pronounced at C5-6 and C6-7 with disc space narrowing, sclerosis and osteophytes. Degenerative changes of the C1-2 articulation. Multilevel facet arthropathy. Facets appear aligned. No subluxation or dislocation. No focal kyphosis. Upper chest: Upper lobe emphysema noted. Right pleural effusion layering posteriorly. Other: None. IMPRESSION: Brain atrophy and chronic white matter microvascular changes. No acute intracranial abnormality by noncontrast CT. No facial bony trauma or fracture. Left facial soft tissue swelling/bruising over the inferior orbit and left maxilla. Cervical degenerative spondylosis without acute osseous finding, fracture or malalignment. Upper lobe emphysema and right pleural effusion Electronically Signed   By: Jerilynn Mages.  Shick M.D.   On: 05/10/2018 10:50   Ct Cervical Spine Wo  Contrast  Result Date: 05/10/2018 CLINICAL DATA:  Fall, head and facial trauma, abrasions and lacerations EXAM: CT HEAD WITHOUT CONTRAST CT MAXILLOFACIAL WITHOUT CONTRAST CT CERVICAL SPINE WITHOUT CONTRAST TECHNIQUE: Multidetector CT imaging of the head, cervical spine, and maxillofacial structures were performed using the standard protocol without intravenous contrast. Multiplanar CT image reconstructions of the cervical spine and maxillofacial structures were also generated. COMPARISON:  03/18/2018 FINDINGS: CT HEAD FINDINGS Brain: Advanced brain atrophy and chronic white matter microvascular ischemic changes throughout both cerebral hemispheres. No acute intracranial hemorrhage, mass lesion, definite new infarction, midline shift, herniation, hydrocephalus, or extra-axial fluid collection. Cisterns are patent. Cerebellar atrophy as well. Vascular: Intracranial atherosclerosis noted.  No hyperdense vessel. Skull: Normal. Negative for fracture or focal lesion. Other: None. CT MAXILLOFACIAL FINDINGS Osseous: Dental hardware creates artifact. Mandible appears intact. No displaced facial bony trauma or fracture. Specifically, the maxilla, pterygoid plates,  zygomas, nasal septum, nasal bones, skull base, and orbits appear intact. Orbits: Negative. No traumatic or inflammatory finding. Sinuses: Sinuses remain clear. No sinus hemorrhage or air-fluid level. Soft tissues: Left inferior orbital/maxillary soft tissue swelling and bruising. CT CERVICAL SPINE FINDINGS Alignment: Normal. Skull base and vertebrae: No acute fracture. No primary bone lesion or focal pathologic process. Soft tissues and spinal canal: No prevertebral fluid or soft tissue swelling. No visible canal hematoma. Carotid atherosclerosis noted. Disc levels: Multilevel degenerative spondylosis, most pronounced at C5-6 and C6-7 with disc space narrowing, sclerosis and osteophytes. Degenerative changes of the C1-2 articulation. Multilevel facet arthropathy.  Facets appear aligned. No subluxation or dislocation. No focal kyphosis. Upper chest: Upper lobe emphysema noted. Right pleural effusion layering posteriorly. Other: None. IMPRESSION: Brain atrophy and chronic white matter microvascular changes. No acute intracranial abnormality by noncontrast CT. No facial bony trauma or fracture. Left facial soft tissue swelling/bruising over the inferior orbit and left maxilla. Cervical degenerative spondylosis without acute osseous finding, fracture or malalignment. Upper lobe emphysema and right pleural effusion Electronically Signed   By: Jerilynn Mages.  Shick M.D.   On: 05/10/2018 10:50   Ct Maxillofacial Wo Contrast  Result Date: 05/10/2018 CLINICAL DATA:  Fall, head and facial trauma, abrasions and lacerations EXAM: CT HEAD WITHOUT CONTRAST CT MAXILLOFACIAL WITHOUT CONTRAST CT CERVICAL SPINE WITHOUT CONTRAST TECHNIQUE: Multidetector CT imaging of the head, cervical spine, and maxillofacial structures were performed using the standard protocol without intravenous contrast. Multiplanar CT image reconstructions of the cervical spine and maxillofacial structures were also generated. COMPARISON:  03/18/2018 FINDINGS: CT HEAD FINDINGS Brain: Advanced brain atrophy and chronic white matter microvascular ischemic changes throughout both cerebral hemispheres. No acute intracranial hemorrhage, mass lesion, definite new infarction, midline shift, herniation, hydrocephalus, or extra-axial fluid collection. Cisterns are patent. Cerebellar atrophy as well. Vascular: Intracranial atherosclerosis noted.  No hyperdense vessel. Skull: Normal. Negative for fracture or focal lesion. Other: None. CT MAXILLOFACIAL FINDINGS Osseous: Dental hardware creates artifact. Mandible appears intact. No displaced facial bony trauma or fracture. Specifically, the maxilla, pterygoid plates, zygomas, nasal septum, nasal bones, skull base, and orbits appear intact. Orbits: Negative. No traumatic or inflammatory  finding. Sinuses: Sinuses remain clear. No sinus hemorrhage or air-fluid level. Soft tissues: Left inferior orbital/maxillary soft tissue swelling and bruising. CT CERVICAL SPINE FINDINGS Alignment: Normal. Skull base and vertebrae: No acute fracture. No primary bone lesion or focal pathologic process. Soft tissues and spinal canal: No prevertebral fluid or soft tissue swelling. No visible canal hematoma. Carotid atherosclerosis noted. Disc levels: Multilevel degenerative spondylosis, most pronounced at C5-6 and C6-7 with disc space narrowing, sclerosis and osteophytes. Degenerative changes of the C1-2 articulation. Multilevel facet arthropathy. Facets appear aligned. No subluxation or dislocation. No focal kyphosis. Upper chest: Upper lobe emphysema noted. Right pleural effusion layering posteriorly. Other: None. IMPRESSION: Brain atrophy and chronic white matter microvascular changes. No acute intracranial abnormality by noncontrast CT. No facial bony trauma or fracture. Left facial soft tissue swelling/bruising over the inferior orbit and left maxilla. Cervical degenerative spondylosis without acute osseous finding, fracture or malalignment. Upper lobe emphysema and right pleural effusion Electronically Signed   By: Jerilynn Mages.  Shick M.D.   On: 05/10/2018 10:50    Procedures Procedures (including critical care time)  Medications Ordered in ED Medications  neomycin-bacitracin-polymyxin (NEOSPORIN) 400-12-4998 ointment (has no administration in time range)  Tdap (BOOSTRIX) injection 0.5 mL (0.5 mLs Intramuscular Given 05/10/18 1041)     Initial Impression / Assessment and Plan / ED Course  I have reviewed  the triage vital signs and the nursing notes.  Pertinent labs & imaging results that were available during my care of the patient were reviewed by me and considered in my medical decision making (see chart for details).     MDM  Pt given a tetanus,  Wounds cleaned and bandaged.  Cts and xrays reviewed  and discussed with pt.  Pt advised tylenol for pain   Final Clinical Impressions(s) / ED Diagnoses   Final diagnoses:  Fall, initial encounter  Abrasions of multiple sites  Contusion of periocular region, unspecified laterality, initial encounter    ED Discharge Orders    None    An After Visit Summary was printed and given to the patient.    Fransico Meadow, [place] 05/10/18 1139    Noemi Chapel, MD 05/10/18 1401

## 2018-05-10 NOTE — Discharge Instructions (Addendum)
Return if any problems.   Tylenol for pain

## 2018-05-10 NOTE — ED Triage Notes (Signed)
Per EMS patient fell by tripping over a curb. Patient has abrasions on forehead, bridge of nose, upper and lower lip. Patient denies dizziness, syncope. Patient teeth are intact. NAD.

## 2018-05-20 LAB — CUP PACEART REMOTE DEVICE CHECK
Battery Remaining Longevity: 34 mo
Brady Statistic RV Percent Paced: 99 %
Implantable Lead Implant Date: 20140407
Implantable Lead Location: 753860
Implantable Lead Model: 5076
Implantable Pulse Generator Implant Date: 20140407
Lead Channel Impedance Value: 0 Ohm
Lead Channel Impedance Value: 649 Ohm
Lead Channel Pacing Threshold Amplitude: 0.75 V
Lead Channel Pacing Threshold Pulse Width: 0.4 ms
MDC IDC MSMT BATTERY IMPEDANCE: 2086 Ohm
MDC IDC MSMT BATTERY VOLTAGE: 2.74 V
MDC IDC SESS DTM: 20190905100724
MDC IDC SET LEADCHNL RV PACING AMPLITUDE: 2.5 V
MDC IDC SET LEADCHNL RV PACING PULSEWIDTH: 0.4 ms
MDC IDC SET LEADCHNL RV SENSING SENSITIVITY: 2.8 mV

## 2018-05-21 ENCOUNTER — Ambulatory Visit (INDEPENDENT_AMBULATORY_CARE_PROVIDER_SITE_OTHER): Payer: PPO | Admitting: Orthopaedic Surgery

## 2018-05-21 ENCOUNTER — Encounter: Payer: Self-pay | Admitting: Orthopaedic Surgery

## 2018-05-21 VITALS — BP 113/59 | HR 69 | Ht 66.0 in | Wt 124.0 lb

## 2018-05-21 DIAGNOSIS — S32020D Wedge compression fracture of second lumbar vertebra, subsequent encounter for fracture with routine healing: Secondary | ICD-10-CM

## 2018-05-21 DIAGNOSIS — F17210 Nicotine dependence, cigarettes, uncomplicated: Secondary | ICD-10-CM

## 2018-05-21 NOTE — Progress Notes (Signed)
CC:  I fell again  He fell again and was seen in the ER 05-10-18.  X-rays were done.  He has no new change in the lumbar compression fracture. I have reviewed the x-rays, report and ER report.  NV intact.  He is a little stronger.  Encounter Diagnoses  Name Primary?  . Closed compression fracture of L2 lumbar vertebra, with routine healing, subsequent encounter Yes  . Cigarette nicotine dependence without complication    I will see him in one month.  Call if any problem.  Precautions discussed.   Electronically Signed Sanjuana Kava, MD 10/2/20198:39 AM

## 2018-05-26 DIAGNOSIS — C61 Malignant neoplasm of prostate: Secondary | ICD-10-CM | POA: Diagnosis not present

## 2018-05-26 DIAGNOSIS — R590 Localized enlarged lymph nodes: Secondary | ICD-10-CM | POA: Diagnosis not present

## 2018-05-26 DIAGNOSIS — J439 Emphysema, unspecified: Secondary | ICD-10-CM | POA: Diagnosis not present

## 2018-05-26 DIAGNOSIS — C3490 Malignant neoplasm of unspecified part of unspecified bronchus or lung: Secondary | ICD-10-CM | POA: Diagnosis not present

## 2018-05-26 DIAGNOSIS — R918 Other nonspecific abnormal finding of lung field: Secondary | ICD-10-CM | POA: Diagnosis not present

## 2018-05-26 DIAGNOSIS — J90 Pleural effusion, not elsewhere classified: Secondary | ICD-10-CM | POA: Diagnosis not present

## 2018-05-26 DIAGNOSIS — I700 Atherosclerosis of aorta: Secondary | ICD-10-CM | POA: Diagnosis not present

## 2018-05-29 DIAGNOSIS — T50915S Adverse effect of multiple unspecified drugs, medicaments and biological substances, sequela: Secondary | ICD-10-CM | POA: Diagnosis not present

## 2018-05-29 DIAGNOSIS — Z192 Hormone resistant malignancy status: Secondary | ICD-10-CM | POA: Diagnosis not present

## 2018-05-29 DIAGNOSIS — C3431 Malignant neoplasm of lower lobe, right bronchus or lung: Secondary | ICD-10-CM | POA: Diagnosis not present

## 2018-05-29 DIAGNOSIS — Z923 Personal history of irradiation: Secondary | ICD-10-CM | POA: Diagnosis not present

## 2018-05-29 DIAGNOSIS — C61 Malignant neoplasm of prostate: Secondary | ICD-10-CM | POA: Diagnosis not present

## 2018-06-02 DIAGNOSIS — J449 Chronic obstructive pulmonary disease, unspecified: Secondary | ICD-10-CM | POA: Diagnosis not present

## 2018-06-02 DIAGNOSIS — I5032 Chronic diastolic (congestive) heart failure: Secondary | ICD-10-CM | POA: Diagnosis not present

## 2018-06-02 DIAGNOSIS — C3490 Malignant neoplasm of unspecified part of unspecified bronchus or lung: Secondary | ICD-10-CM | POA: Diagnosis not present

## 2018-06-02 DIAGNOSIS — I10 Essential (primary) hypertension: Secondary | ICD-10-CM | POA: Diagnosis not present

## 2018-06-06 ENCOUNTER — Other Ambulatory Visit: Payer: Self-pay | Admitting: Adult Health

## 2018-06-10 DIAGNOSIS — Z5111 Encounter for antineoplastic chemotherapy: Secondary | ICD-10-CM | POA: Diagnosis not present

## 2018-06-10 DIAGNOSIS — F17200 Nicotine dependence, unspecified, uncomplicated: Secondary | ICD-10-CM | POA: Diagnosis not present

## 2018-06-10 DIAGNOSIS — J90 Pleural effusion, not elsewhere classified: Secondary | ICD-10-CM | POA: Diagnosis not present

## 2018-06-10 DIAGNOSIS — C61 Malignant neoplasm of prostate: Secondary | ICD-10-CM | POA: Diagnosis not present

## 2018-06-10 DIAGNOSIS — C3431 Malignant neoplasm of lower lobe, right bronchus or lung: Secondary | ICD-10-CM | POA: Diagnosis not present

## 2018-06-10 DIAGNOSIS — E876 Hypokalemia: Secondary | ICD-10-CM | POA: Diagnosis not present

## 2018-06-10 DIAGNOSIS — Z79899 Other long term (current) drug therapy: Secondary | ICD-10-CM | POA: Diagnosis not present

## 2018-06-18 ENCOUNTER — Ambulatory Visit: Payer: PPO | Admitting: Orthopaedic Surgery

## 2018-06-18 ENCOUNTER — Encounter: Payer: Self-pay | Admitting: Orthopaedic Surgery

## 2018-06-18 VITALS — BP 97/49 | HR 86 | Ht 66.0 in | Wt 124.0 lb

## 2018-06-18 DIAGNOSIS — F17210 Nicotine dependence, cigarettes, uncomplicated: Secondary | ICD-10-CM

## 2018-06-18 DIAGNOSIS — S32020D Wedge compression fracture of second lumbar vertebra, subsequent encounter for fracture with routine healing: Secondary | ICD-10-CM

## 2018-06-18 NOTE — Progress Notes (Signed)
CC:  I am much better  He has little pain of the back.  He has stopped using his cane.  He is walking well.  NV intact.   Encounter Diagnoses  Name Primary?  . Closed compression fracture of L2 lumbar vertebra, with routine healing, subsequent encounter Yes  . Cigarette nicotine dependence without complication    I will discharge him.  Call if any problem.  Precautions discussed.   Electronically Signed Sanjuana Kava, MD 10/30/20198:44 AM

## 2018-06-19 DIAGNOSIS — F17200 Nicotine dependence, unspecified, uncomplicated: Secondary | ICD-10-CM | POA: Diagnosis not present

## 2018-06-19 DIAGNOSIS — R590 Localized enlarged lymph nodes: Secondary | ICD-10-CM | POA: Diagnosis not present

## 2018-06-19 DIAGNOSIS — R918 Other nonspecific abnormal finding of lung field: Secondary | ICD-10-CM | POA: Diagnosis not present

## 2018-06-19 DIAGNOSIS — Z8546 Personal history of malignant neoplasm of prostate: Secondary | ICD-10-CM | POA: Diagnosis not present

## 2018-06-19 DIAGNOSIS — J90 Pleural effusion, not elsewhere classified: Secondary | ICD-10-CM | POA: Diagnosis not present

## 2018-06-19 DIAGNOSIS — F17210 Nicotine dependence, cigarettes, uncomplicated: Secondary | ICD-10-CM | POA: Diagnosis not present

## 2018-06-19 DIAGNOSIS — C3431 Malignant neoplasm of lower lobe, right bronchus or lung: Secondary | ICD-10-CM | POA: Diagnosis not present

## 2018-07-01 DIAGNOSIS — C3431 Malignant neoplasm of lower lobe, right bronchus or lung: Secondary | ICD-10-CM | POA: Diagnosis not present

## 2018-07-01 DIAGNOSIS — R918 Other nonspecific abnormal finding of lung field: Secondary | ICD-10-CM | POA: Diagnosis not present

## 2018-07-07 DIAGNOSIS — C61 Malignant neoplasm of prostate: Secondary | ICD-10-CM | POA: Diagnosis not present

## 2018-07-07 DIAGNOSIS — J449 Chronic obstructive pulmonary disease, unspecified: Secondary | ICD-10-CM | POA: Diagnosis not present

## 2018-07-07 DIAGNOSIS — I4891 Unspecified atrial fibrillation: Secondary | ICD-10-CM | POA: Diagnosis not present

## 2018-07-07 DIAGNOSIS — C3490 Malignant neoplasm of unspecified part of unspecified bronchus or lung: Secondary | ICD-10-CM | POA: Diagnosis not present

## 2018-07-08 DIAGNOSIS — Z79899 Other long term (current) drug therapy: Secondary | ICD-10-CM | POA: Diagnosis not present

## 2018-07-08 DIAGNOSIS — Z192 Hormone resistant malignancy status: Secondary | ICD-10-CM | POA: Diagnosis not present

## 2018-07-08 DIAGNOSIS — C3431 Malignant neoplasm of lower lobe, right bronchus or lung: Secondary | ICD-10-CM | POA: Diagnosis not present

## 2018-07-08 DIAGNOSIS — C61 Malignant neoplasm of prostate: Secondary | ICD-10-CM | POA: Diagnosis not present

## 2018-07-08 DIAGNOSIS — Z5111 Encounter for antineoplastic chemotherapy: Secondary | ICD-10-CM | POA: Diagnosis not present

## 2018-07-24 ENCOUNTER — Ambulatory Visit (INDEPENDENT_AMBULATORY_CARE_PROVIDER_SITE_OTHER): Payer: PPO

## 2018-07-24 DIAGNOSIS — I495 Sick sinus syndrome: Secondary | ICD-10-CM

## 2018-07-24 NOTE — Progress Notes (Signed)
Remote pacemaker transmission.   

## 2018-07-30 ENCOUNTER — Encounter: Payer: Self-pay | Admitting: Cardiology

## 2018-07-30 NOTE — Progress Notes (Signed)
Letter  

## 2018-08-07 DIAGNOSIS — C3490 Malignant neoplasm of unspecified part of unspecified bronchus or lung: Secondary | ICD-10-CM | POA: Diagnosis not present

## 2018-08-07 DIAGNOSIS — J449 Chronic obstructive pulmonary disease, unspecified: Secondary | ICD-10-CM | POA: Diagnosis not present

## 2018-08-07 DIAGNOSIS — I5032 Chronic diastolic (congestive) heart failure: Secondary | ICD-10-CM | POA: Diagnosis not present

## 2018-08-07 DIAGNOSIS — I4891 Unspecified atrial fibrillation: Secondary | ICD-10-CM | POA: Diagnosis not present

## 2018-09-08 LAB — CUP PACEART REMOTE DEVICE CHECK
Battery Remaining Longevity: 31 mo
Battery Voltage: 2.75 V
Implantable Lead Implant Date: 20140407
Implantable Lead Location: 753860
Implantable Lead Model: 5076
Implantable Pulse Generator Implant Date: 20140407
Lead Channel Impedance Value: 0 Ohm
Lead Channel Pacing Threshold Amplitude: 1 V
Lead Channel Pacing Threshold Pulse Width: 0.4 ms
Lead Channel Setting Pacing Amplitude: 2.5 V
MDC IDC MSMT BATTERY IMPEDANCE: 2297 Ohm
MDC IDC MSMT LEADCHNL RV IMPEDANCE VALUE: 577 Ohm
MDC IDC SESS DTM: 20191205104244
MDC IDC SET LEADCHNL RV PACING PULSEWIDTH: 0.4 ms
MDC IDC SET LEADCHNL RV SENSING SENSITIVITY: 2 mV
MDC IDC STAT BRADY RV PERCENT PACED: 99 %

## 2018-09-17 DIAGNOSIS — C3490 Malignant neoplasm of unspecified part of unspecified bronchus or lung: Secondary | ICD-10-CM | POA: Diagnosis not present

## 2018-09-17 DIAGNOSIS — J449 Chronic obstructive pulmonary disease, unspecified: Secondary | ICD-10-CM | POA: Diagnosis not present

## 2018-09-17 DIAGNOSIS — J9611 Chronic respiratory failure with hypoxia: Secondary | ICD-10-CM | POA: Diagnosis not present

## 2018-09-17 DIAGNOSIS — I10 Essential (primary) hypertension: Secondary | ICD-10-CM | POA: Diagnosis not present

## 2018-10-19 DEATH — deceased

## 2018-10-23 ENCOUNTER — Encounter: Payer: PPO | Admitting: *Deleted

## 2018-10-24 ENCOUNTER — Telehealth: Payer: Self-pay

## 2018-10-24 NOTE — Telephone Encounter (Signed)
Left message for patient to remind of missed remote transmission.  

## 2018-11-03 ENCOUNTER — Encounter: Payer: Self-pay | Admitting: Cardiology

## 2020-06-07 IMAGING — CT CT HEAD W/O CM
5 of 11 series · 16 of 47 positions shown, 18 images · non-contrast
Comparison: 03/18/2018

CLINICAL DATA: Fall, head and facial trauma, abrasions and
lacerations

EXAM:
CT HEAD WITHOUT CONTRAST
CT MAXILLOFACIAL WITHOUT CONTRAST
CT CERVICAL SPINE WITHOUT CONTRAST
TECHNIQUE: Multidetector CT imaging of the head, cervical spine, and
maxillofacial structures were performed using the standard protocol
without intravenous contrast. Multiplanar CT image reconstructions
of the cervical spine and maxillofacial structures were also
generated.

[Series 4: head bone · axial · 0.47mm/px · z∈[+1190,+1222]mm · 2 of 80 slices shown]
[im 16/80  bone]
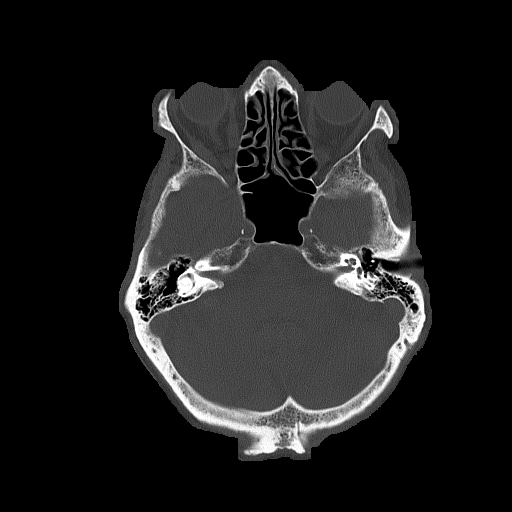
[im 32/80  bone]
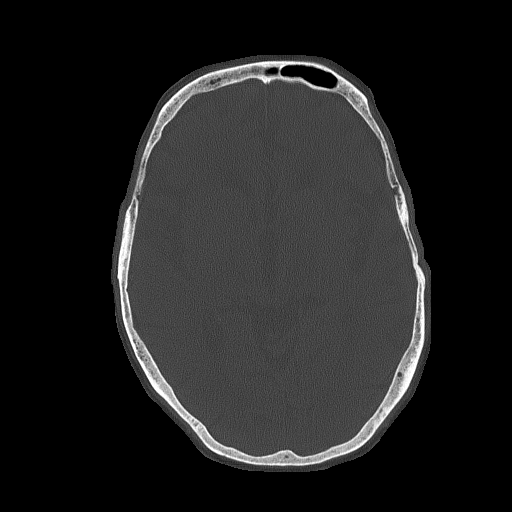

[Series 7: max soft · axial · 0.29mm/px · z∈[+1076,+1196]mm · 5 of 91 slices shown]
[im 16/91  brain]
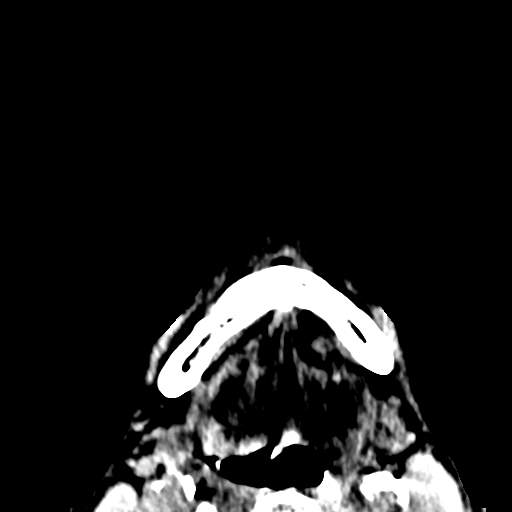
[im 31/91  brain]
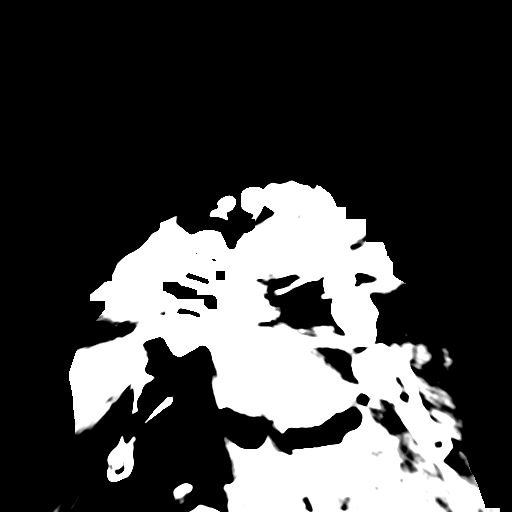
[im 46/91  brain]
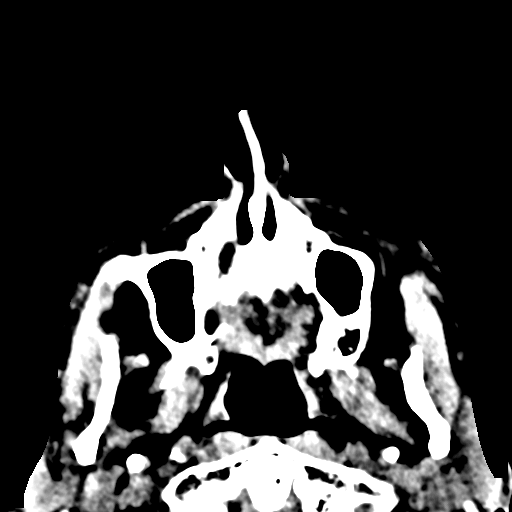
[im 61/91  brain]
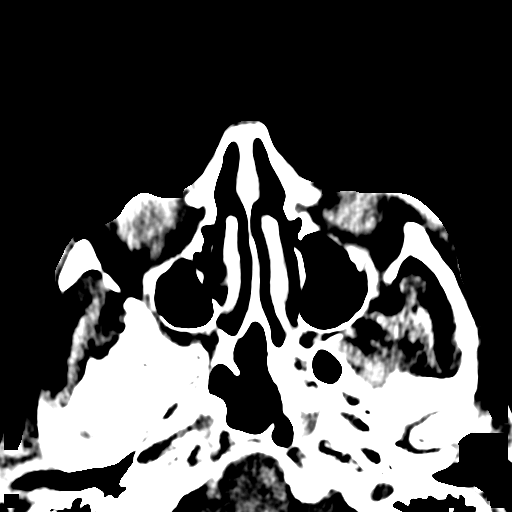
[im 76/91  brain]
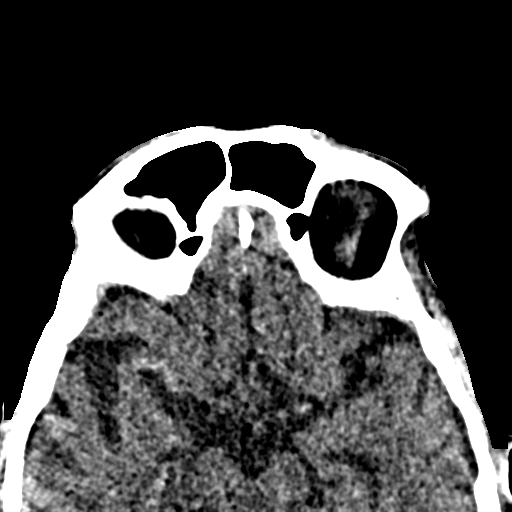

[Series 11: coronal soft · coronal · 0.44mm/px · 2 of 56 slices shown]
[im 19/56  brain]
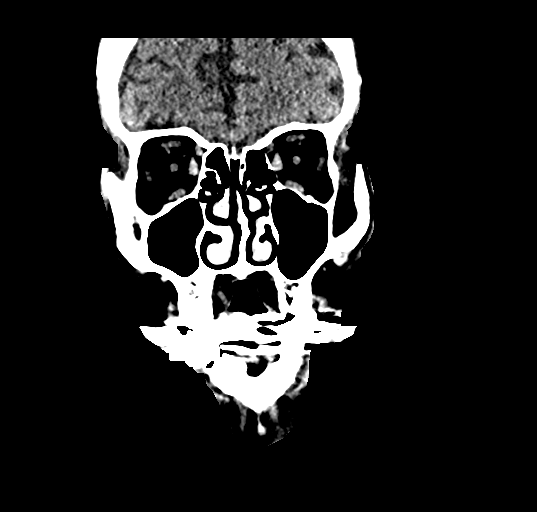
[im 37/56  brain]
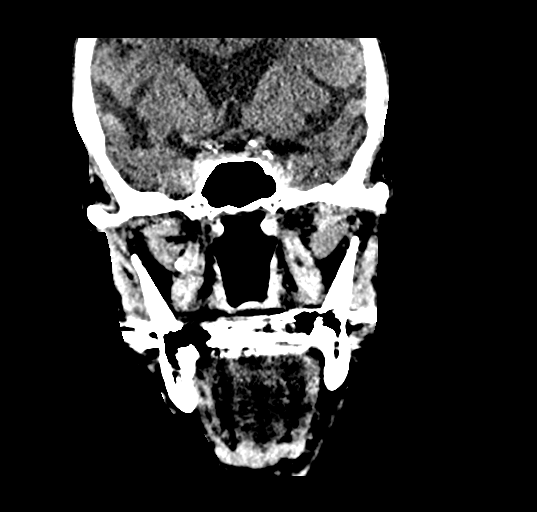

[Series 12: sagittal soft · sagittal · 0.32mm/px · 1 of 80 slices shown]
[im 40/80  brain]
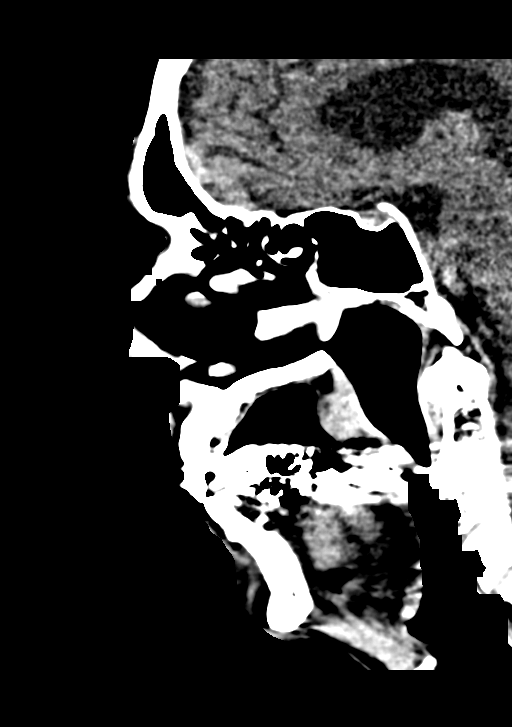

[Series 20: orthogonal axials · axial · 0.21mm/px · z∈[+991,+1112]mm · 6 of 103 slices shown, 8 images]
[im 15/103  brain]
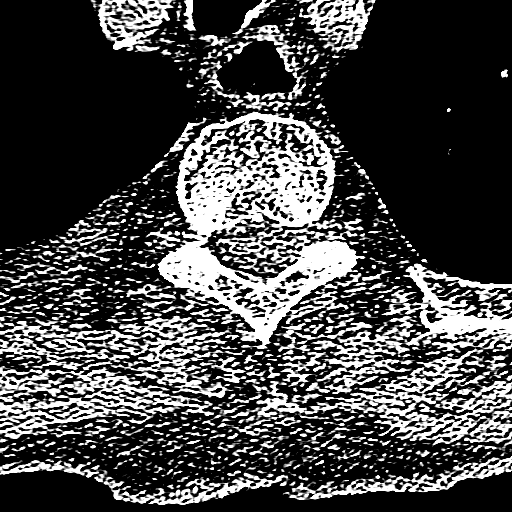
[im 15/103  bone]
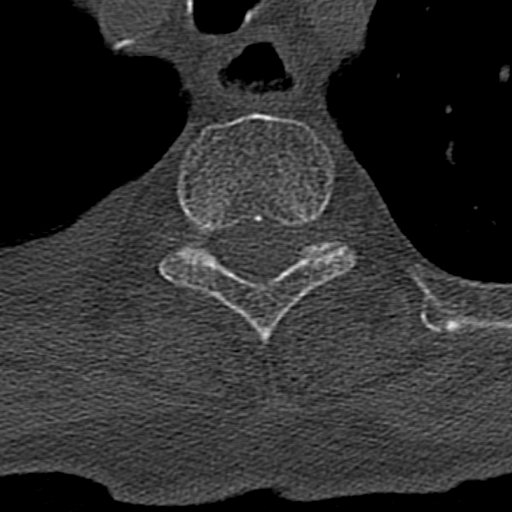
[im 30/103  brain]
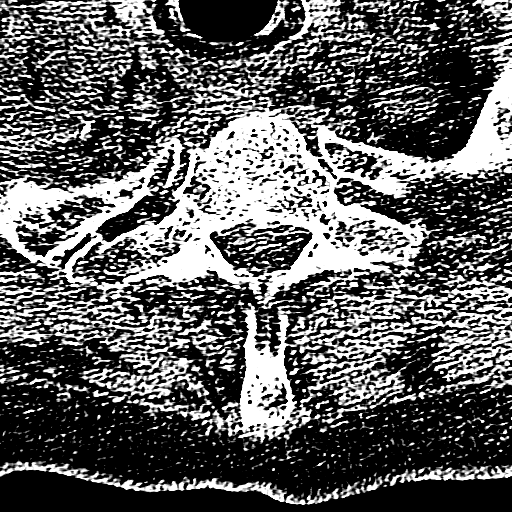
[im 44/103  brain]
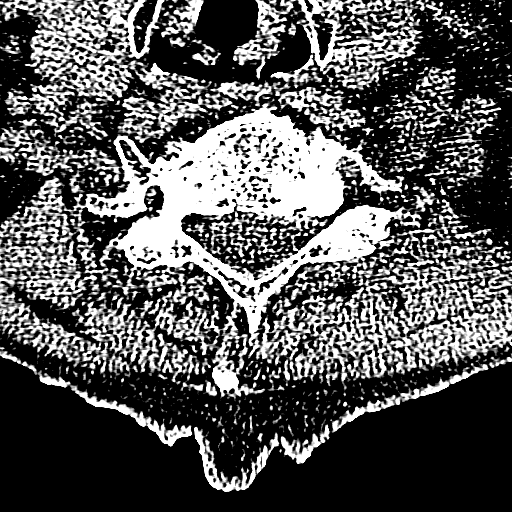
[im 59/103  brain]
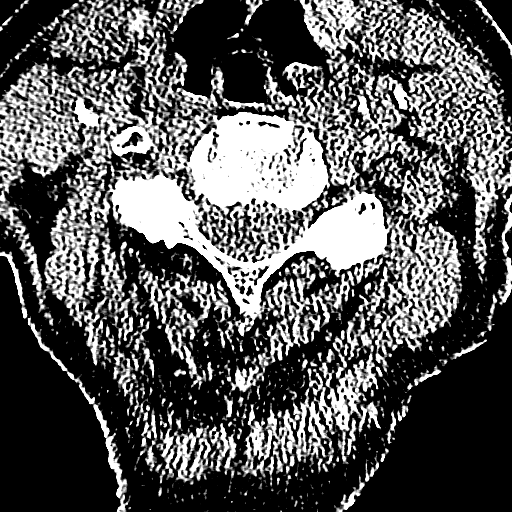
[im 73/103  brain]
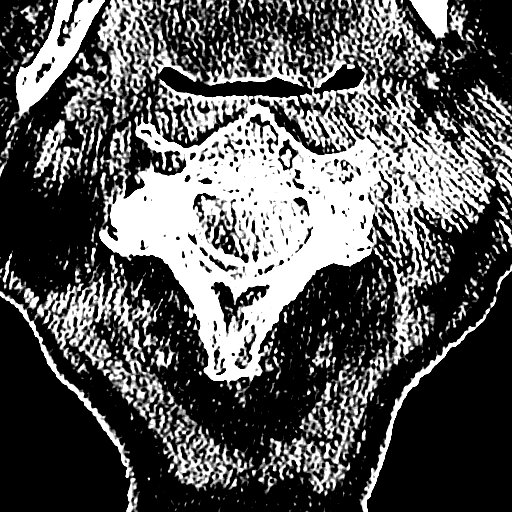
[im 73/103  bone]
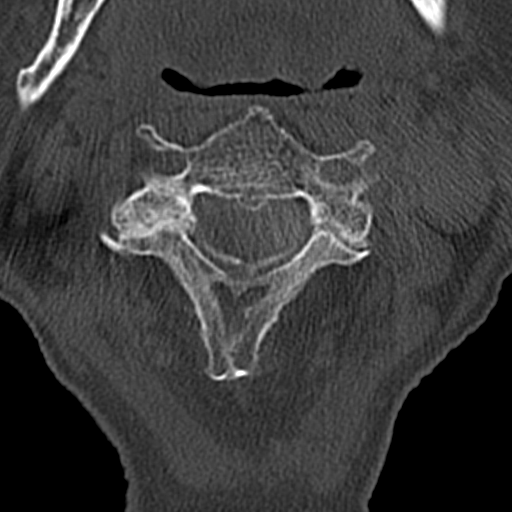
[im 88/103  brain]
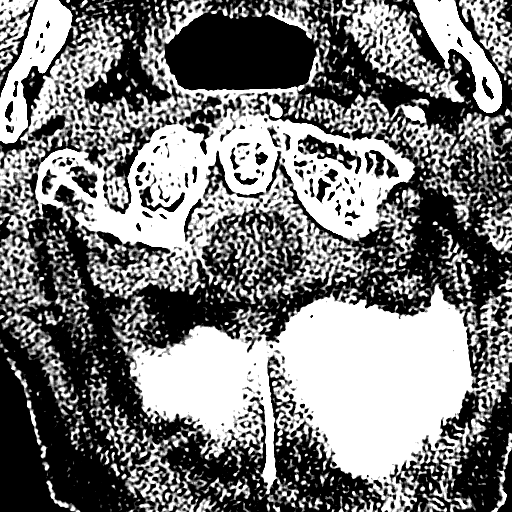

[16 of 47 positions shown; findings below may reference images not displayed]

FINDINGS: CT HEAD FINDINGS

Brain: Advanced brain atrophy and chronic white matter microvascular
ischemic changes throughout both cerebral hemispheres. No acute
intracranial hemorrhage, mass lesion, definite new infarction,
midline shift, herniation, hydrocephalus, or extra-axial fluid
collection. Cisterns are patent. Cerebellar atrophy as well.

Vascular: Intracranial atherosclerosis noted.  No hyperdense vessel.

Skull: Normal. Negative for fracture or focal lesion.

Other: None.

CT MAXILLOFACIAL FINDINGS

Osseous: Dental hardware creates artifact. Mandible appears intact.
No displaced facial bony trauma or fracture. Specifically, the
maxilla, pterygoid plates, zygomas, nasal septum, nasal bones, skull
base, and orbits appear intact.

Orbits: Negative. No traumatic or inflammatory finding.

Sinuses: Sinuses remain clear. No sinus hemorrhage or air-fluid
level.

Soft tissues: Left inferior orbital/maxillary soft tissue swelling
and bruising.

CT CERVICAL SPINE FINDINGS

Alignment: Normal.

Skull base and vertebrae: No acute fracture. No primary bone lesion
or focal pathologic process.

Soft tissues and spinal canal: No prevertebral fluid or soft tissue
swelling. No visible canal hematoma. Carotid atherosclerosis noted.

Disc levels: Multilevel degenerative spondylosis, most pronounced at
C5-6 and C6-7 with disc space narrowing, sclerosis and osteophytes.
Degenerative changes of the C1-2 articulation. Multilevel facet
arthropathy. Facets appear aligned. No subluxation or dislocation.
No focal kyphosis.

Upper chest: Upper lobe emphysema noted. Right pleural effusion
layering posteriorly.

Other: None.
IMPRESSION: Brain atrophy and chronic white matter microvascular changes. No
acute intracranial abnormality by noncontrast CT.

No facial bony trauma or fracture. Left facial soft tissue
swelling/bruising over the inferior orbit and left maxilla.

Cervical degenerative spondylosis without acute osseous finding,
fracture or malalignment.

Upper lobe emphysema and right pleural effusion
